# Patient Record
Sex: Male | Born: 1945
Health system: Southern US, Community
[De-identification: ages and names within clinical notes are randomized; demographics above are authoritative.]

## PROBLEM LIST (undated history)

## (undated) DIAGNOSIS — R7303 Prediabetes: Secondary | ICD-10-CM

## (undated) DIAGNOSIS — C801 Malignant (primary) neoplasm, unspecified: Secondary | ICD-10-CM

## (undated) DIAGNOSIS — I10 Essential (primary) hypertension: Secondary | ICD-10-CM

## (undated) DIAGNOSIS — I471 Supraventricular tachycardia, unspecified: Secondary | ICD-10-CM

## (undated) DIAGNOSIS — I255 Ischemic cardiomyopathy: Secondary | ICD-10-CM

## (undated) DIAGNOSIS — I509 Heart failure, unspecified: Secondary | ICD-10-CM

## (undated) DIAGNOSIS — I350 Nonrheumatic aortic (valve) stenosis: Secondary | ICD-10-CM

## (undated) DIAGNOSIS — I2510 Atherosclerotic heart disease of native coronary artery without angina pectoris: Secondary | ICD-10-CM

## (undated) HISTORY — DX: Supraventricular tachycardia, unspecified: I47.10

## (undated) HISTORY — DX: Supraventricular tachycardia: I47.1

## (undated) HISTORY — DX: Malignant (primary) neoplasm, unspecified: C80.1

## (undated) HISTORY — DX: Essential (primary) hypertension: I10

## (undated) HISTORY — DX: Heart failure, unspecified: I50.9

## (undated) HISTORY — DX: Atherosclerotic heart disease of native coronary artery without angina pectoris: I25.10

## (undated) HISTORY — DX: Ischemic cardiomyopathy: I25.5

## (undated) HISTORY — PX: SKIN CANCER EXCISION: SHX779

---

## 1997-12-12 ENCOUNTER — Inpatient Hospital Stay (HOSPITAL_COMMUNITY): Admission: RE | Admit: 1997-12-12 | Discharge: 1997-12-22 | Payer: Self-pay | Admitting: Cardiology

## 2012-10-27 DIAGNOSIS — C44319 Basal cell carcinoma of skin of other parts of face: Secondary | ICD-10-CM | POA: Diagnosis not present

## 2012-10-27 DIAGNOSIS — L408 Other psoriasis: Secondary | ICD-10-CM | POA: Diagnosis not present

## 2012-11-30 DIAGNOSIS — C4401 Basal cell carcinoma of skin of lip: Secondary | ICD-10-CM | POA: Diagnosis not present

## 2012-11-30 DIAGNOSIS — Z9889 Other specified postprocedural states: Secondary | ICD-10-CM | POA: Insufficient documentation

## 2012-11-30 DIAGNOSIS — C44319 Basal cell carcinoma of skin of other parts of face: Secondary | ICD-10-CM | POA: Diagnosis not present

## 2012-11-30 DIAGNOSIS — M952 Other acquired deformity of head: Secondary | ICD-10-CM | POA: Diagnosis not present

## 2012-12-01 DIAGNOSIS — M952 Other acquired deformity of head: Secondary | ICD-10-CM | POA: Diagnosis not present

## 2012-12-01 DIAGNOSIS — C44319 Basal cell carcinoma of skin of other parts of face: Secondary | ICD-10-CM | POA: Diagnosis not present

## 2013-03-30 DIAGNOSIS — I10 Essential (primary) hypertension: Secondary | ICD-10-CM | POA: Diagnosis not present

## 2013-03-30 DIAGNOSIS — E7800 Pure hypercholesterolemia, unspecified: Secondary | ICD-10-CM | POA: Diagnosis not present

## 2013-03-31 DIAGNOSIS — E7800 Pure hypercholesterolemia, unspecified: Secondary | ICD-10-CM | POA: Diagnosis not present

## 2013-03-31 DIAGNOSIS — E119 Type 2 diabetes mellitus without complications: Secondary | ICD-10-CM | POA: Diagnosis not present

## 2013-03-31 DIAGNOSIS — I10 Essential (primary) hypertension: Secondary | ICD-10-CM | POA: Diagnosis not present

## 2013-05-13 DIAGNOSIS — I10 Essential (primary) hypertension: Secondary | ICD-10-CM | POA: Diagnosis not present

## 2013-05-13 DIAGNOSIS — E7800 Pure hypercholesterolemia, unspecified: Secondary | ICD-10-CM | POA: Diagnosis not present

## 2013-05-20 DIAGNOSIS — E7800 Pure hypercholesterolemia, unspecified: Secondary | ICD-10-CM | POA: Diagnosis not present

## 2013-05-20 DIAGNOSIS — I10 Essential (primary) hypertension: Secondary | ICD-10-CM | POA: Diagnosis not present

## 2014-04-12 DIAGNOSIS — E7800 Pure hypercholesterolemia, unspecified: Secondary | ICD-10-CM | POA: Diagnosis not present

## 2014-04-12 DIAGNOSIS — R5381 Other malaise: Secondary | ICD-10-CM | POA: Diagnosis not present

## 2014-04-12 DIAGNOSIS — IMO0001 Reserved for inherently not codable concepts without codable children: Secondary | ICD-10-CM | POA: Diagnosis not present

## 2014-04-12 DIAGNOSIS — I10 Essential (primary) hypertension: Secondary | ICD-10-CM | POA: Diagnosis not present

## 2014-04-14 DIAGNOSIS — IMO0001 Reserved for inherently not codable concepts without codable children: Secondary | ICD-10-CM | POA: Diagnosis not present

## 2014-04-19 DIAGNOSIS — J309 Allergic rhinitis, unspecified: Secondary | ICD-10-CM | POA: Diagnosis not present

## 2014-04-19 DIAGNOSIS — M545 Low back pain, unspecified: Secondary | ICD-10-CM | POA: Diagnosis not present

## 2014-04-19 DIAGNOSIS — E7800 Pure hypercholesterolemia, unspecified: Secondary | ICD-10-CM | POA: Diagnosis not present

## 2014-04-19 DIAGNOSIS — IMO0001 Reserved for inherently not codable concepts without codable children: Secondary | ICD-10-CM | POA: Diagnosis not present

## 2014-04-19 DIAGNOSIS — I10 Essential (primary) hypertension: Secondary | ICD-10-CM | POA: Diagnosis not present

## 2014-04-19 DIAGNOSIS — L408 Other psoriasis: Secondary | ICD-10-CM | POA: Diagnosis not present

## 2014-05-31 DIAGNOSIS — E1165 Type 2 diabetes mellitus with hyperglycemia: Secondary | ICD-10-CM | POA: Diagnosis not present

## 2014-05-31 DIAGNOSIS — I10 Essential (primary) hypertension: Secondary | ICD-10-CM | POA: Diagnosis not present

## 2014-05-31 DIAGNOSIS — J019 Acute sinusitis, unspecified: Secondary | ICD-10-CM | POA: Diagnosis not present

## 2014-08-15 DIAGNOSIS — E780 Pure hypercholesterolemia: Secondary | ICD-10-CM | POA: Diagnosis not present

## 2014-08-15 DIAGNOSIS — E1165 Type 2 diabetes mellitus with hyperglycemia: Secondary | ICD-10-CM | POA: Diagnosis not present

## 2014-08-15 DIAGNOSIS — I10 Essential (primary) hypertension: Secondary | ICD-10-CM | POA: Diagnosis not present

## 2015-03-06 DIAGNOSIS — M25562 Pain in left knee: Secondary | ICD-10-CM | POA: Diagnosis not present

## 2015-03-06 DIAGNOSIS — M1712 Unilateral primary osteoarthritis, left knee: Secondary | ICD-10-CM | POA: Diagnosis not present

## 2015-08-01 DIAGNOSIS — E1165 Type 2 diabetes mellitus with hyperglycemia: Secondary | ICD-10-CM | POA: Diagnosis not present

## 2015-08-01 DIAGNOSIS — I10 Essential (primary) hypertension: Secondary | ICD-10-CM | POA: Diagnosis not present

## 2015-08-01 DIAGNOSIS — E7800 Pure hypercholesterolemia, unspecified: Secondary | ICD-10-CM | POA: Diagnosis not present

## 2015-08-07 DIAGNOSIS — I10 Essential (primary) hypertension: Secondary | ICD-10-CM | POA: Diagnosis not present

## 2015-08-07 DIAGNOSIS — Z1389 Encounter for screening for other disorder: Secondary | ICD-10-CM | POA: Diagnosis not present

## 2015-08-07 DIAGNOSIS — E1165 Type 2 diabetes mellitus with hyperglycemia: Secondary | ICD-10-CM | POA: Diagnosis not present

## 2015-10-30 DIAGNOSIS — R05 Cough: Secondary | ICD-10-CM | POA: Diagnosis not present

## 2015-12-13 DIAGNOSIS — I10 Essential (primary) hypertension: Secondary | ICD-10-CM | POA: Diagnosis not present

## 2015-12-13 DIAGNOSIS — E7800 Pure hypercholesterolemia, unspecified: Secondary | ICD-10-CM | POA: Diagnosis not present

## 2015-12-13 DIAGNOSIS — E1165 Type 2 diabetes mellitus with hyperglycemia: Secondary | ICD-10-CM | POA: Diagnosis not present

## 2016-09-19 DIAGNOSIS — I10 Essential (primary) hypertension: Secondary | ICD-10-CM | POA: Diagnosis not present

## 2016-09-19 DIAGNOSIS — E1165 Type 2 diabetes mellitus with hyperglycemia: Secondary | ICD-10-CM | POA: Diagnosis not present

## 2016-09-19 DIAGNOSIS — Z6835 Body mass index (BMI) 35.0-35.9, adult: Secondary | ICD-10-CM | POA: Diagnosis not present

## 2016-09-19 DIAGNOSIS — Z1389 Encounter for screening for other disorder: Secondary | ICD-10-CM | POA: Diagnosis not present

## 2016-09-19 DIAGNOSIS — Z23 Encounter for immunization: Secondary | ICD-10-CM | POA: Diagnosis not present

## 2016-09-19 DIAGNOSIS — Z0000 Encounter for general adult medical examination without abnormal findings: Secondary | ICD-10-CM | POA: Diagnosis not present

## 2016-09-19 DIAGNOSIS — E7800 Pure hypercholesterolemia, unspecified: Secondary | ICD-10-CM | POA: Diagnosis not present

## 2017-01-15 DIAGNOSIS — E7800 Pure hypercholesterolemia, unspecified: Secondary | ICD-10-CM | POA: Diagnosis not present

## 2017-01-15 DIAGNOSIS — E1165 Type 2 diabetes mellitus with hyperglycemia: Secondary | ICD-10-CM | POA: Diagnosis not present

## 2017-01-15 DIAGNOSIS — I10 Essential (primary) hypertension: Secondary | ICD-10-CM | POA: Diagnosis not present

## 2017-01-21 DIAGNOSIS — E1165 Type 2 diabetes mellitus with hyperglycemia: Secondary | ICD-10-CM | POA: Diagnosis not present

## 2017-01-21 DIAGNOSIS — I10 Essential (primary) hypertension: Secondary | ICD-10-CM | POA: Diagnosis not present

## 2017-01-21 DIAGNOSIS — E7800 Pure hypercholesterolemia, unspecified: Secondary | ICD-10-CM | POA: Diagnosis not present

## 2017-01-21 DIAGNOSIS — Z6834 Body mass index (BMI) 34.0-34.9, adult: Secondary | ICD-10-CM | POA: Diagnosis not present

## 2017-01-21 DIAGNOSIS — Z0001 Encounter for general adult medical examination with abnormal findings: Secondary | ICD-10-CM | POA: Diagnosis not present

## 2017-12-07 DIAGNOSIS — E1165 Type 2 diabetes mellitus with hyperglycemia: Secondary | ICD-10-CM | POA: Diagnosis not present

## 2017-12-07 DIAGNOSIS — E7800 Pure hypercholesterolemia, unspecified: Secondary | ICD-10-CM | POA: Diagnosis not present

## 2017-12-07 DIAGNOSIS — Z6834 Body mass index (BMI) 34.0-34.9, adult: Secondary | ICD-10-CM | POA: Diagnosis not present

## 2017-12-07 DIAGNOSIS — L408 Other psoriasis: Secondary | ICD-10-CM | POA: Diagnosis not present

## 2017-12-07 DIAGNOSIS — I10 Essential (primary) hypertension: Secondary | ICD-10-CM | POA: Diagnosis not present

## 2017-12-07 DIAGNOSIS — D519 Vitamin B12 deficiency anemia, unspecified: Secondary | ICD-10-CM | POA: Diagnosis not present

## 2017-12-07 DIAGNOSIS — Z0000 Encounter for general adult medical examination without abnormal findings: Secondary | ICD-10-CM | POA: Diagnosis not present

## 2018-03-22 DIAGNOSIS — B356 Tinea cruris: Secondary | ICD-10-CM | POA: Diagnosis not present

## 2018-03-22 DIAGNOSIS — R300 Dysuria: Secondary | ICD-10-CM | POA: Diagnosis not present

## 2018-03-22 DIAGNOSIS — Z6833 Body mass index (BMI) 33.0-33.9, adult: Secondary | ICD-10-CM | POA: Diagnosis not present

## 2018-03-22 DIAGNOSIS — E1165 Type 2 diabetes mellitus with hyperglycemia: Secondary | ICD-10-CM | POA: Diagnosis not present

## 2018-12-21 DIAGNOSIS — E7800 Pure hypercholesterolemia, unspecified: Secondary | ICD-10-CM | POA: Diagnosis not present

## 2018-12-21 DIAGNOSIS — E1165 Type 2 diabetes mellitus with hyperglycemia: Secondary | ICD-10-CM | POA: Diagnosis not present

## 2018-12-21 DIAGNOSIS — R5382 Chronic fatigue, unspecified: Secondary | ICD-10-CM | POA: Diagnosis not present

## 2018-12-21 DIAGNOSIS — I10 Essential (primary) hypertension: Secondary | ICD-10-CM | POA: Diagnosis not present

## 2018-12-21 DIAGNOSIS — L408 Other psoriasis: Secondary | ICD-10-CM | POA: Diagnosis not present

## 2018-12-24 DIAGNOSIS — Z0001 Encounter for general adult medical examination with abnormal findings: Secondary | ICD-10-CM | POA: Diagnosis not present

## 2018-12-24 DIAGNOSIS — I10 Essential (primary) hypertension: Secondary | ICD-10-CM | POA: Diagnosis not present

## 2018-12-24 DIAGNOSIS — Z6834 Body mass index (BMI) 34.0-34.9, adult: Secondary | ICD-10-CM | POA: Diagnosis not present

## 2018-12-24 DIAGNOSIS — E7800 Pure hypercholesterolemia, unspecified: Secondary | ICD-10-CM | POA: Diagnosis not present

## 2018-12-24 DIAGNOSIS — E1165 Type 2 diabetes mellitus with hyperglycemia: Secondary | ICD-10-CM | POA: Diagnosis not present

## 2019-11-17 DIAGNOSIS — K5900 Constipation, unspecified: Secondary | ICD-10-CM | POA: Diagnosis not present

## 2019-11-17 DIAGNOSIS — R634 Abnormal weight loss: Secondary | ICD-10-CM | POA: Diagnosis not present

## 2019-11-17 DIAGNOSIS — Z6832 Body mass index (BMI) 32.0-32.9, adult: Secondary | ICD-10-CM | POA: Diagnosis not present

## 2019-11-17 DIAGNOSIS — R0600 Dyspnea, unspecified: Secondary | ICD-10-CM | POA: Diagnosis not present

## 2019-11-17 DIAGNOSIS — R601 Generalized edema: Secondary | ICD-10-CM | POA: Diagnosis not present

## 2019-11-21 DIAGNOSIS — I498 Other specified cardiac arrhythmias: Secondary | ICD-10-CM | POA: Insufficient documentation

## 2019-11-21 DIAGNOSIS — E785 Hyperlipidemia, unspecified: Secondary | ICD-10-CM | POA: Insufficient documentation

## 2019-11-21 DIAGNOSIS — I2510 Atherosclerotic heart disease of native coronary artery without angina pectoris: Secondary | ICD-10-CM | POA: Diagnosis not present

## 2019-11-21 DIAGNOSIS — I472 Ventricular tachycardia: Secondary | ICD-10-CM | POA: Diagnosis not present

## 2019-11-21 DIAGNOSIS — I10 Essential (primary) hypertension: Secondary | ICD-10-CM | POA: Insufficient documentation

## 2019-11-21 DIAGNOSIS — I509 Heart failure, unspecified: Secondary | ICD-10-CM | POA: Diagnosis not present

## 2019-11-21 DIAGNOSIS — R55 Syncope and collapse: Secondary | ICD-10-CM | POA: Diagnosis not present

## 2019-11-21 DIAGNOSIS — I471 Supraventricular tachycardia: Secondary | ICD-10-CM | POA: Diagnosis not present

## 2019-11-28 DIAGNOSIS — J90 Pleural effusion, not elsewhere classified: Secondary | ICD-10-CM | POA: Diagnosis not present

## 2019-11-28 DIAGNOSIS — R911 Solitary pulmonary nodule: Secondary | ICD-10-CM | POA: Diagnosis not present

## 2019-11-28 DIAGNOSIS — I700 Atherosclerosis of aorta: Secondary | ICD-10-CM | POA: Diagnosis not present

## 2019-11-28 DIAGNOSIS — I2510 Atherosclerotic heart disease of native coronary artery without angina pectoris: Secondary | ICD-10-CM | POA: Diagnosis not present

## 2019-11-28 DIAGNOSIS — R188 Other ascites: Secondary | ICD-10-CM | POA: Diagnosis not present

## 2019-12-06 DIAGNOSIS — I509 Heart failure, unspecified: Secondary | ICD-10-CM | POA: Diagnosis not present

## 2019-12-06 DIAGNOSIS — I350 Nonrheumatic aortic (valve) stenosis: Secondary | ICD-10-CM | POA: Diagnosis not present

## 2019-12-06 DIAGNOSIS — I340 Nonrheumatic mitral (valve) insufficiency: Secondary | ICD-10-CM | POA: Diagnosis not present

## 2019-12-06 DIAGNOSIS — I351 Nonrheumatic aortic (valve) insufficiency: Secondary | ICD-10-CM | POA: Diagnosis not present

## 2019-12-06 DIAGNOSIS — I110 Hypertensive heart disease with heart failure: Secondary | ICD-10-CM | POA: Diagnosis not present

## 2019-12-12 DIAGNOSIS — I5022 Chronic systolic (congestive) heart failure: Secondary | ICD-10-CM | POA: Diagnosis not present

## 2019-12-12 DIAGNOSIS — R55 Syncope and collapse: Secondary | ICD-10-CM | POA: Diagnosis not present

## 2019-12-12 DIAGNOSIS — I498 Other specified cardiac arrhythmias: Secondary | ICD-10-CM | POA: Diagnosis not present

## 2019-12-12 DIAGNOSIS — I255 Ischemic cardiomyopathy: Secondary | ICD-10-CM | POA: Diagnosis not present

## 2019-12-12 DIAGNOSIS — I10 Essential (primary) hypertension: Secondary | ICD-10-CM | POA: Diagnosis not present

## 2019-12-12 DIAGNOSIS — E785 Hyperlipidemia, unspecified: Secondary | ICD-10-CM | POA: Diagnosis not present

## 2019-12-14 DIAGNOSIS — I472 Ventricular tachycardia: Secondary | ICD-10-CM | POA: Diagnosis not present

## 2019-12-14 DIAGNOSIS — R55 Syncope and collapse: Secondary | ICD-10-CM | POA: Diagnosis not present

## 2019-12-14 DIAGNOSIS — I471 Supraventricular tachycardia: Secondary | ICD-10-CM | POA: Diagnosis not present

## 2019-12-27 DIAGNOSIS — S8992XA Unspecified injury of left lower leg, initial encounter: Secondary | ICD-10-CM | POA: Diagnosis not present

## 2019-12-27 DIAGNOSIS — E785 Hyperlipidemia, unspecified: Secondary | ICD-10-CM | POA: Diagnosis not present

## 2019-12-27 DIAGNOSIS — I499 Cardiac arrhythmia, unspecified: Secondary | ICD-10-CM | POA: Diagnosis not present

## 2019-12-27 DIAGNOSIS — Z7984 Long term (current) use of oral hypoglycemic drugs: Secondary | ICD-10-CM | POA: Diagnosis not present

## 2019-12-27 DIAGNOSIS — E872 Acidosis: Secondary | ICD-10-CM | POA: Diagnosis not present

## 2019-12-27 DIAGNOSIS — I2510 Atherosclerotic heart disease of native coronary artery without angina pectoris: Secondary | ICD-10-CM | POA: Diagnosis not present

## 2019-12-27 DIAGNOSIS — Z87891 Personal history of nicotine dependence: Secondary | ICD-10-CM | POA: Diagnosis not present

## 2019-12-27 DIAGNOSIS — Z951 Presence of aortocoronary bypass graft: Secondary | ICD-10-CM | POA: Diagnosis not present

## 2019-12-27 DIAGNOSIS — Z7982 Long term (current) use of aspirin: Secondary | ICD-10-CM | POA: Diagnosis not present

## 2019-12-27 DIAGNOSIS — E118 Type 2 diabetes mellitus with unspecified complications: Secondary | ICD-10-CM | POA: Diagnosis not present

## 2019-12-27 DIAGNOSIS — R7402 Elevation of levels of lactic acid dehydrogenase (LDH): Secondary | ICD-10-CM | POA: Diagnosis not present

## 2019-12-27 DIAGNOSIS — L03115 Cellulitis of right lower limb: Secondary | ICD-10-CM | POA: Diagnosis not present

## 2019-12-27 DIAGNOSIS — L03116 Cellulitis of left lower limb: Secondary | ICD-10-CM | POA: Diagnosis not present

## 2019-12-27 DIAGNOSIS — Z79899 Other long term (current) drug therapy: Secondary | ICD-10-CM | POA: Diagnosis not present

## 2019-12-27 DIAGNOSIS — I509 Heart failure, unspecified: Secondary | ICD-10-CM | POA: Diagnosis not present

## 2019-12-27 DIAGNOSIS — S81811A Laceration without foreign body, right lower leg, initial encounter: Secondary | ICD-10-CM | POA: Diagnosis not present

## 2019-12-27 DIAGNOSIS — I110 Hypertensive heart disease with heart failure: Secondary | ICD-10-CM | POA: Diagnosis not present

## 2019-12-27 DIAGNOSIS — E869 Volume depletion, unspecified: Secondary | ICD-10-CM | POA: Diagnosis not present

## 2019-12-27 DIAGNOSIS — D72829 Elevated white blood cell count, unspecified: Secondary | ICD-10-CM | POA: Diagnosis not present

## 2019-12-27 DIAGNOSIS — Z20822 Contact with and (suspected) exposure to covid-19: Secondary | ICD-10-CM | POA: Diagnosis not present

## 2019-12-28 DIAGNOSIS — L03115 Cellulitis of right lower limb: Secondary | ICD-10-CM | POA: Diagnosis not present

## 2019-12-28 DIAGNOSIS — I2510 Atherosclerotic heart disease of native coronary artery without angina pectoris: Secondary | ICD-10-CM | POA: Diagnosis not present

## 2019-12-28 DIAGNOSIS — E119 Type 2 diabetes mellitus without complications: Secondary | ICD-10-CM | POA: Diagnosis not present

## 2019-12-28 DIAGNOSIS — I5022 Chronic systolic (congestive) heart failure: Secondary | ICD-10-CM | POA: Diagnosis not present

## 2019-12-30 ENCOUNTER — Other Ambulatory Visit (HOSPITAL_COMMUNITY): Payer: PPO

## 2020-01-03 ENCOUNTER — Ambulatory Visit (HOSPITAL_COMMUNITY): Admission: RE | Admit: 2020-01-03 | Payer: PPO | Source: Home / Self Care | Admitting: Cardiology

## 2020-01-03 ENCOUNTER — Encounter (HOSPITAL_COMMUNITY): Admission: RE | Payer: Self-pay | Source: Home / Self Care

## 2020-01-03 SURGERY — RIGHT/LEFT HEART CATH AND CORONARY ANGIOGRAPHY
Anesthesia: LOCAL

## 2020-01-12 DIAGNOSIS — E7800 Pure hypercholesterolemia, unspecified: Secondary | ICD-10-CM | POA: Diagnosis not present

## 2020-01-12 DIAGNOSIS — D519 Vitamin B12 deficiency anemia, unspecified: Secondary | ICD-10-CM | POA: Diagnosis not present

## 2020-01-12 DIAGNOSIS — R5382 Chronic fatigue, unspecified: Secondary | ICD-10-CM | POA: Diagnosis not present

## 2020-01-12 DIAGNOSIS — I10 Essential (primary) hypertension: Secondary | ICD-10-CM | POA: Diagnosis not present

## 2020-01-12 DIAGNOSIS — E1165 Type 2 diabetes mellitus with hyperglycemia: Secondary | ICD-10-CM | POA: Diagnosis not present

## 2020-01-16 DIAGNOSIS — Z6830 Body mass index (BMI) 30.0-30.9, adult: Secondary | ICD-10-CM | POA: Diagnosis not present

## 2020-01-16 DIAGNOSIS — R634 Abnormal weight loss: Secondary | ICD-10-CM | POA: Diagnosis not present

## 2020-01-16 DIAGNOSIS — R601 Generalized edema: Secondary | ICD-10-CM | POA: Diagnosis not present

## 2020-01-16 DIAGNOSIS — K5900 Constipation, unspecified: Secondary | ICD-10-CM | POA: Diagnosis not present

## 2020-01-16 DIAGNOSIS — Z0001 Encounter for general adult medical examination with abnormal findings: Secondary | ICD-10-CM | POA: Diagnosis not present

## 2020-01-18 ENCOUNTER — Other Ambulatory Visit: Payer: Self-pay

## 2020-01-18 ENCOUNTER — Encounter: Payer: Self-pay | Admitting: Cardiology

## 2020-01-18 ENCOUNTER — Encounter: Payer: Self-pay | Admitting: *Deleted

## 2020-01-18 ENCOUNTER — Ambulatory Visit (INDEPENDENT_AMBULATORY_CARE_PROVIDER_SITE_OTHER): Payer: PPO | Admitting: Cardiology

## 2020-01-18 VITALS — BP 114/78 | HR 107 | Ht 69.5 in | Wt 208.6 lb

## 2020-01-18 DIAGNOSIS — I471 Supraventricular tachycardia, unspecified: Secondary | ICD-10-CM

## 2020-01-18 DIAGNOSIS — I5022 Chronic systolic (congestive) heart failure: Secondary | ICD-10-CM

## 2020-01-18 DIAGNOSIS — I2510 Atherosclerotic heart disease of native coronary artery without angina pectoris: Secondary | ICD-10-CM

## 2020-01-18 DIAGNOSIS — Z01812 Encounter for preprocedural laboratory examination: Secondary | ICD-10-CM | POA: Diagnosis not present

## 2020-01-18 MED ORDER — SACUBITRIL-VALSARTAN 49-51 MG PO TABS: 1.0000 | ORAL_TABLET | Freq: Two times a day (BID) | ORAL | 6 refills | Status: DC

## 2020-01-18 MED ORDER — SACUBITRIL-VALSARTAN 49-51 MG PO TABS: 1.0000 | ORAL_TABLET | Freq: Two times a day (BID) | ORAL | 0 refills | Status: DC

## 2020-01-18 NOTE — H&P (View-Only) (Signed)
Clinical Summary [name] is a 74 y.o.male seen as a new patient. Previously followed at [facility] but is establishing here in our clinic   1. Chronic systolic HF - from [facility] notes 12/2019 echo showed LVEF 20-25%, new diagnosis for patient - at that time had significant volume overload, succesfully diuresed and started on medical therapy.  - he is compliant with meds - no recent SOB/DOE   2. PSVT - from [facility] can see a zio patch was ordered, there a phone note reporting it looked good but I do not see the full report - no recent palpitatoins - he is on beta blocker    3. CAD - history of 5 vessel CABG in 1999      PMH 1. CAD 2. PSVT   No Known Allergies   Current Outpatient Medications  Medication Sig Dispense Refill  . acetaminophen (TYLENOL) 650 MG CR tablet Take 1,300 mg by mouth every 8 (eight) hours as needed for pain.    . Ascorbic Acid (VITAMIN C) 1000 MG tablet Take 1,000 mg by mouth daily.    Marland Kitchen aspirin EC 325 MG tablet Take 325 mg by mouth daily.    . Ca Carbonate-Mag Hydroxide (ROLAIDS PO) Take 1 tablet by mouth daily as needed (indigestion).    . Cholecalciferol (DIALYVITE VITAMIN D 5000) 125 MCG (5000 UT) capsule Take 5,000 Units by mouth daily.    . Coenzyme Q10 (COQ10) 100 MG CAPS Take 100 mg by mouth daily.    . furosemide (LASIX) 40 MG tablet Take 40 mg by mouth daily.    Marland Kitchen glipiZIDE (GLUCOTROL XL) 10 MG 24 hr tablet Take 10 mg by mouth daily.    . Melatonin 5 MG CAPS Take 5 mg by mouth at bedtime as needed (sleep).    . metFORMIN (GLUCOPHAGE-XR) 500 MG 24 hr tablet Take 1,000 mg by mouth 2 (two) times daily.    . metoprolol succinate (TOPROL-XL) 100 MG 24 hr tablet Take 100 mg by mouth daily. Take with or immediately following a meal.    . quinapril (ACCUPRIL) 40 MG tablet Take 40 mg by mouth daily.    . rosuvastatin (CRESTOR) 10 MG tablet Take 10 mg by mouth at bedtime.     No current facility-administered medications for this visit.         No Known Allergies    No family history on file.   Social History [name] has no history on file for tobacco use. [name] has no history on file for alcohol use.   Review of Systems CONSTITUTIONAL: No weight loss, fever, chills, weakness or fatigue.  HEENT: Eyes: No visual loss, blurred vision, double vision or yellow sclerae.No hearing loss, sneezing, congestion, runny nose or sore throat.  SKIN: No rash or itching.  CARDIOVASCULAR: per hpi RESPIRATORY: No shortness of breath, cough or sputum.  GASTROINTESTINAL: No anorexia, nausea, vomiting or diarrhea. No abdominal pain or blood.  GENITOURINARY: No burning on urination, no polyuria NEUROLOGICAL: No headache, dizziness, syncope, paralysis, ataxia, numbness or tingling in the extremities. No change in bowel or bladder control.  MUSCULOSKELETAL: No muscle, back pain, joint pain or stiffness.  LYMPHATICS: No enlarged nodes. No history of splenectomy.  PSYCHIATRIC: No history of depression or anxiety.  ENDOCRINOLOGIC: No reports of sweating, cold or heat intolerance. No polyuria or polydipsia.  Marland Kitchen   Physical Examination Today's Vitals   01/18/20 0823  BP: 114/78  Pulse: (!) 107  SpO2: 98%  Weight: 208  lb 9.6 oz (94.6 kg)  Height: 5' 9.5" (1.765 m)   Body mass index is 30.36 kg/m.  Gen: resting comfortably, no acute distress HEENT: no scleral icterus, pupils equal round and reactive, no palptable cervical adenopathy,  CV: RRR, no m/r/g, no jvd Resp: Clear to auscultation bilaterally GI: abdomen is soft, non-tender, non-distended, normal bowel sounds, no hepatosplenomegaly MSK: extremities are warm, no edema.  Skin: warm, no rash Neuro:  no focal deficits Psych: appropriate affect   Diagnostic Studies  Echo: 12/2019 Summary 1. Technically difficult study due to body habitus. 2. Echo contrast utilized to enhance endocardial border definition. 3. The left ventricle is mildly to moderately dilated  in size with upper normal wall thickness. 4. The left ventricular systolic function is severely decreased, LVEF is visually estimated at 20-25%. 5. There is moderate mitral valve regurgitation. 6. There is mild to moderate aortic valve stenosis. 7. There is mild aortic regurgitation. 8. The left atrium is moderately dilated in size. 9. The right ventricle is normal in size, with normal systolic function. 10. The right atrium is mildly dilated in size.    Assessment and Plan  1. Chronic systolic HF - new diagnosis based on 12/2019 echo at [facility], LVEF 20-25% - prior history of CAD with prior CABG, certaintly ICM is a likely cause - will arrange RHC/LHC, he would like to have done in 3 weeks to complete his f/u for recent issues with cellulitis in his leg, he is off abx - d/c quinapril, wait 48 hours and start entresto 49/51mg  bid.   2. CAD - no recent chest pain - with drop in LVEF plan for cath - he is on full dose ASA, this is our first visit together, review prior cardiology records to see indications, likely change to 81mg  at f/u  3. PSVT - request zio patch results from [facility] - no symptoms, continue beta blocker     I have reviewed the risks, indications, and alternatives to cardiac catheterization, possible angioplasty, and stenting with the patient and his wife today. Risks include but are not limited to bleeding, infection, vascular injury, stroke, myocardial infection, arrhythmia, kidney injury, radiation-related injury in the case of prolonged fluoroscopy use, emergency cardiac surgery, and death. The patient understands the risks of serious complication is 1-2 in 2706 with diagnostic cardiac cath and 1-2% or less with angioplasty/stenting.     Arnoldo Lenis, M.D.

## 2020-01-18 NOTE — Progress Notes (Signed)
Clinical Summary Mr. Mohrmann is a 74 y.o.male seen as a new patient. Previously followed at [facility] but is establishing here in our clinic   1. Chronic systolic HF - from [facility] notes 12/2019 echo showed LVEF 20-25%, new diagnosis for patient - at that time had significant volume overload, succesfully diuresed and started on medical therapy.  - he is compliant with meds - no recent SOB/DOE   2. PSVT - from [facility] can see a zio patch was ordered, there a phone note reporting it looked good but I do not see the full report - no recent palpitatoins - he is on beta blocker    3. CAD - history of 5 vessel CABG in 1999      PMH 1. CAD 2. PSVT   No Known Allergies   Current Outpatient Medications  Medication Sig Dispense Refill   acetaminophen (TYLENOL) 650 MG CR tablet Take 1,300 mg by mouth every 8 (eight) hours as needed for pain.     Ascorbic Acid (VITAMIN C) 1000 MG tablet Take 1,000 mg by mouth daily.     aspirin EC 325 MG tablet Take 325 mg by mouth daily.     Ca Carbonate-Mag Hydroxide (ROLAIDS PO) Take 1 tablet by mouth daily as needed (indigestion).     Cholecalciferol (DIALYVITE VITAMIN D 5000) 125 MCG (5000 UT) capsule Take 5,000 Units by mouth daily.     Coenzyme Q10 (COQ10) 100 MG CAPS Take 100 mg by mouth daily.     furosemide (LASIX) 40 MG tablet Take 40 mg by mouth daily.     glipiZIDE (GLUCOTROL XL) 10 MG 24 hr tablet Take 10 mg by mouth daily.     Melatonin 5 MG CAPS Take 5 mg by mouth at bedtime as needed (sleep).     metFORMIN (GLUCOPHAGE-XR) 500 MG 24 hr tablet Take 1,000 mg by mouth 2 (two) times daily.     metoprolol succinate (TOPROL-XL) 100 MG 24 hr tablet Take 100 mg by mouth daily. Take with or immediately following a meal.     quinapril (ACCUPRIL) 40 MG tablet Take 40 mg by mouth daily.     rosuvastatin (CRESTOR) 10 MG tablet Take 10 mg by mouth at bedtime.     No current facility-administered medications for this visit.         No Known Allergies    No family history on file.   Social History Mr. Swindell has no history on file for tobacco use. Mr. Collingsworth has no history on file for alcohol use.   Review of Systems CONSTITUTIONAL: No weight loss, fever, chills, weakness or fatigue.  HEENT: Eyes: No visual loss, blurred vision, double vision or yellow sclerae.No hearing loss, sneezing, congestion, runny nose or sore throat.  SKIN: No rash or itching.  CARDIOVASCULAR: per hpi RESPIRATORY: No shortness of breath, cough or sputum.  GASTROINTESTINAL: No anorexia, nausea, vomiting or diarrhea. No abdominal pain or blood.  GENITOURINARY: No burning on urination, no polyuria NEUROLOGICAL: No headache, dizziness, syncope, paralysis, ataxia, numbness or tingling in the extremities. No change in bowel or bladder control.  MUSCULOSKELETAL: No muscle, back pain, joint pain or stiffness.  LYMPHATICS: No enlarged nodes. No history of splenectomy.  PSYCHIATRIC: No history of depression or anxiety.  ENDOCRINOLOGIC: No reports of sweating, cold or heat intolerance. No polyuria or polydipsia.  Marland Kitchen   Physical Examination Today's Vitals   01/18/20 0823  BP: 114/78  Pulse: (!) 107  SpO2: 98%  Weight: 208  lb 9.6 oz (94.6 kg)  Height: 5' 9.5" (1.765 m)   Body mass index is 30.36 kg/m.  Gen: resting comfortably, no acute distress HEENT: no scleral icterus, pupils equal round and reactive, no palptable cervical adenopathy,  CV: RRR, no m/r/g, no jvd Resp: Clear to auscultation bilaterally GI: abdomen is soft, non-tender, non-distended, normal bowel sounds, no hepatosplenomegaly MSK: extremities are warm, no edema.  Skin: warm, no rash Neuro:  no focal deficits Psych: appropriate affect   Diagnostic Studies  Echo: 12/2019 Summary 1. Technically difficult study due to body habitus. 2. Echo contrast utilized to enhance endocardial border definition. 3. The left ventricle is mildly to moderately dilated  in size with upper normal wall thickness. 4. The left ventricular systolic function is severely decreased, LVEF is visually estimated at 20-25%. 5. There is moderate mitral valve regurgitation. 6. There is mild to moderate aortic valve stenosis. 7. There is mild aortic regurgitation. 8. The left atrium is moderately dilated in size. 9. The right ventricle is normal in size, with normal systolic function. 10. The right atrium is mildly dilated in size.    Assessment and Plan  1. Chronic systolic HF - new diagnosis based on 12/2019 echo at [facility], LVEF 20-25% - prior history of CAD with prior CABG, certaintly ICM is a likely cause - will arrange RHC/LHC, he would like to have done in 3 weeks to complete his f/u for recent issues with cellulitis in his leg, he is off abx - d/c quinapril, wait 48 hours and start entresto 49/51mg  bid.   2. CAD - no recent chest pain - with drop in LVEF plan for cath - he is on full dose ASA, this is our first visit together, review prior cardiology records to see indications, likely change to 81mg  at f/u  3. PSVT - request zio patch results from [facility] - no symptoms, continue beta blocker     I have reviewed the risks, indications, and alternatives to cardiac catheterization, possible angioplasty, and stenting with the patient and his wife today. Risks include but are not limited to bleeding, infection, vascular injury, stroke, myocardial infection, arrhythmia, kidney injury, radiation-related injury in the case of prolonged fluoroscopy use, emergency cardiac surgery, and death. The patient understands the risks of serious complication is 1-2 in 9833 with diagnostic cardiac cath and 1-2% or less with angioplasty/stenting.     Arnoldo Lenis, M.D.

## 2020-01-18 NOTE — Patient Instructions (Signed)
Medication Instructions:   Stop Quinapril.    After 48 hours, begin Entresto 49/51mg  twice a day.  Continue all other medications.    Labwork: BMET, CBC - orders given today.  Testing/Procedures: Your physician has requested that you have a cardiac catheterization. Cardiac catheterization is used to diagnose and/or treat various heart conditions. Doctors may recommend this procedure for a number of different reasons. The most common reason is to evaluate chest pain. Chest pain can be a symptom of coronary artery disease (CAD), and cardiac catheterization can show whether plaque is narrowing or blocking your heart's arteries. This procedure is also used to evaluate the valves, as well as measure the blood flow and oxygen levels in different parts of your heart. For further information please visit [email or web address]. Please follow instruction sheet, as given.  Follow-Up: 1 month   Any Other Special Instructions Will Be Listed Below (If Applicable).  If you need a refill on your cardiac medications before your next appointment, please call your pharmacy.

## 2020-01-26 ENCOUNTER — Telehealth: Payer: Self-pay | Admitting: Cardiology

## 2020-01-26 ENCOUNTER — Telehealth: Payer: Self-pay

## 2020-01-26 NOTE — Telephone Encounter (Signed)
New message   Jinny Blossom [phone]   Ref # [id number]   [place] authorization for [employer]

## 2020-01-28 DIAGNOSIS — L03119 Cellulitis of unspecified part of limb: Secondary | ICD-10-CM | POA: Diagnosis not present

## 2020-02-01 ENCOUNTER — Telehealth: Payer: Self-pay

## 2020-02-01 DIAGNOSIS — Z01812 Encounter for preprocedural laboratory examination: Secondary | ICD-10-CM | POA: Diagnosis not present

## 2020-02-01 DIAGNOSIS — I5022 Chronic systolic (congestive) heart failure: Secondary | ICD-10-CM | POA: Diagnosis not present

## 2020-02-01 LAB — BASIC METABOLIC PANEL
BUN/Creatinine Ratio: 29 (calc) — ABNORMAL HIGH (ref 6–22)
BUN: 30 mg/dL — ABNORMAL HIGH (ref 7–25)
CO2: 27 mmol/L (ref 20–32)
Calcium: 10.2 mg/dL (ref 8.6–10.3)
Chloride: 103 mmol/L (ref 98–110)
Creat: 1.02 mg/dL (ref 0.70–1.18)
Glucose, Bld: 198 mg/dL — ABNORMAL HIGH (ref 65–139)
Potassium: 5.3 mmol/L (ref 3.5–5.3)
Sodium: 138 mmol/L (ref 135–146)

## 2020-02-01 LAB — CBC
HCT: 42.6 % (ref 38.5–50.0)
Hemoglobin: 13.8 g/dL (ref 13.2–17.1)
MCH: 28.5 pg (ref 27.0–33.0)
MCHC: 32.4 g/dL (ref 32.0–36.0)
MCV: 88 fL (ref 80.0–100.0)
MPV: 11.6 fL (ref 7.5–12.5)
Platelets: 229 10*3/uL (ref 140–400)
RBC: 4.84 10*6/uL (ref 4.20–5.80)
RDW: 16.3 % — ABNORMAL HIGH (ref 11.0–15.0)
WBC: 9.7 10*3/uL (ref 3.8–10.8)

## 2020-02-01 NOTE — Telephone Encounter (Signed)
Spoke with the pt and he verbalized understanding of his Cath intruction. Will call if he has any further questions.   Pt aware.. Md to review his labs that he had drawn today 02/02/20.   Pt had his COVID vaccine 09/09/19 and 10/08/19.

## 2020-02-01 NOTE — Telephone Encounter (Signed)
Per his cath instructions letter - he was to be doing them today.

## 2020-02-01 NOTE — Telephone Encounter (Signed)
LMTCB RE:   Pre-catheterization call for Cath scheduled at [facility] for: 02/08/20.   Verified arrival time and place: [facility]) at: 6:30 am.    Pt still needs to have his labs drawn... orders are in the Epic   No solid food after midnight prior to cath, clear liquids until 5 AM day of procedure.   AM meds can be  taken pre-cath with sips of water including: ASA 81 mg ( he has been on 325 mg)   Hold:  lasix, Glucotrol, and Metformin day of procedure (48 hours after also for metformin)    Confirmed patient has responsible adult to drive home post procedure and observe 24 hours after arriving home:   You are allowed ONE visitor in the waiting room during your procedure. Both you and your visitor must wear masks.        COVID-19 Pre-Screening Questions:  . In the past 7 to 10 days have you had a new cough, shortness of breath, headache, congestion, fever (100 or greater) unexplained body aches, new sore throat, or sudden loss of taste or sense of smell? Marland Kitchen In the past 7 to 10 days have you been around anyone with known Covid 19?

## 2020-02-08 ENCOUNTER — Ambulatory Visit (HOSPITAL_COMMUNITY)
Admission: RE | Admit: 2020-02-08 | Discharge: 2020-02-08 | Disposition: A | Discharge status: Home / Self Care | Payer: PPO | Attending: Cardiovascular Disease | Admitting: Cardiovascular Disease

## 2020-02-08 ENCOUNTER — Encounter (HOSPITAL_COMMUNITY)
Admission: RE | Disposition: A | Discharge status: Home / Self Care | Payer: Self-pay | Source: Home / Self Care | Attending: Cardiovascular Disease

## 2020-02-08 ENCOUNTER — Other Ambulatory Visit: Payer: Self-pay

## 2020-02-08 DIAGNOSIS — Z951 Presence of aortocoronary bypass graft: Secondary | ICD-10-CM | POA: Diagnosis not present

## 2020-02-08 DIAGNOSIS — I471 Supraventricular tachycardia: Secondary | ICD-10-CM | POA: Diagnosis not present

## 2020-02-08 DIAGNOSIS — Z7984 Long term (current) use of oral hypoglycemic drugs: Secondary | ICD-10-CM | POA: Diagnosis not present

## 2020-02-08 DIAGNOSIS — I255 Ischemic cardiomyopathy: Secondary | ICD-10-CM | POA: Diagnosis not present

## 2020-02-08 DIAGNOSIS — Z79899 Other long term (current) drug therapy: Secondary | ICD-10-CM | POA: Diagnosis not present

## 2020-02-08 DIAGNOSIS — I5022 Chronic systolic (congestive) heart failure: Secondary | ICD-10-CM | POA: Diagnosis not present

## 2020-02-08 DIAGNOSIS — Z7982 Long term (current) use of aspirin: Secondary | ICD-10-CM | POA: Diagnosis not present

## 2020-02-08 DIAGNOSIS — I2510 Atherosclerotic heart disease of native coronary artery without angina pectoris: Secondary | ICD-10-CM

## 2020-02-08 DIAGNOSIS — I5023 Acute on chronic systolic (congestive) heart failure: Secondary | ICD-10-CM

## 2020-02-08 HISTORY — PX: RIGHT/LEFT HEART CATH AND CORONARY/GRAFT ANGIOGRAPHY: CATH118267

## 2020-02-08 LAB — POCT I-STAT 7, (LYTES, BLD GAS, ICA,H+H)
Acid-base deficit: 2 mmol/L (ref 0.0–2.0)
Bicarbonate: 23.1 mmol/L (ref 20.0–28.0)
Calcium, Ion: 1.3 mmol/L (ref 1.15–1.40)
HCT: 41 % (ref 39.0–52.0)
Hemoglobin: 13.9 g/dL (ref 13.0–17.0)
O2 Saturation: 98 %
Potassium: 4.6 mmol/L (ref 3.5–5.1)
Sodium: 140 mmol/L (ref 135–145)
TCO2: 24 mmol/L (ref 22–32)
pCO2 arterial: 39.4 mmHg (ref 32.0–48.0)
pH, Arterial: 7.375 (ref 7.350–7.450)
pO2, Arterial: 113 mmHg — ABNORMAL HIGH (ref 83.0–108.0)

## 2020-02-08 LAB — POCT I-STAT EG7
Acid-base deficit: 1 mmol/L (ref 0.0–2.0)
Bicarbonate: 24.5 mmol/L (ref 20.0–28.0)
Calcium, Ion: 1.29 mmol/L (ref 1.15–1.40)
HCT: 41 % (ref 39.0–52.0)
Hemoglobin: 13.9 g/dL (ref 13.0–17.0)
O2 Saturation: 70 %
Potassium: 4.5 mmol/L (ref 3.5–5.1)
Sodium: 140 mmol/L (ref 135–145)
TCO2: 26 mmol/L (ref 22–32)
pCO2, Ven: 43.5 mmHg — ABNORMAL LOW (ref 44.0–60.0)
pH, Ven: 7.358 (ref 7.250–7.430)
pO2, Ven: 39 mmHg (ref 32.0–45.0)

## 2020-02-08 LAB — GLUCOSE, CAPILLARY
Glucose-Capillary: 115 mg/dL — ABNORMAL HIGH (ref 70–99)
Glucose-Capillary: 128 mg/dL — ABNORMAL HIGH (ref 70–99)

## 2020-02-08 SURGERY — RIGHT/LEFT HEART CATH AND CORONARY/GRAFT ANGIOGRAPHY
Anesthesia: LOCAL

## 2020-02-08 MED ORDER — HYDRALAZINE HCL 20 MG/ML IJ SOLN: 10.0000 mg | INTRAMUSCULAR | Status: DC | PRN

## 2020-02-08 MED ORDER — LIDOCAINE HCL (PF) 1 % IJ SOLN
INTRAMUSCULAR | Status: AC
  Filled 2020-02-08: qty 30

## 2020-02-08 MED ORDER — SODIUM CHLORIDE 0.9 % IV SOLN: 250.0000 mL | INTRAVENOUS | Status: DC | PRN

## 2020-02-08 MED ORDER — SODIUM CHLORIDE 0.9% FLUSH: 3.0000 mL | INTRAVENOUS | Status: DC | PRN

## 2020-02-08 MED ORDER — MIDAZOLAM HCL 2 MG/2ML IJ SOLN
INTRAMUSCULAR | Status: DC | PRN
  Administered 2020-02-08: 2 mg via INTRAVENOUS

## 2020-02-08 MED ORDER — ACETAMINOPHEN 325 MG PO TABS: 650.0000 mg | ORAL_TABLET | ORAL | Status: DC | PRN

## 2020-02-08 MED ORDER — HEPARIN (PORCINE) IN NACL 1000-0.9 UT/500ML-% IV SOLN
INTRAVENOUS | Status: AC
  Filled 2020-02-08: qty 500

## 2020-02-08 MED ORDER — IOHEXOL 350 MG/ML SOLN
INTRAVENOUS | Status: AC
  Filled 2020-02-08: qty 1

## 2020-02-08 MED ORDER — VERAPAMIL HCL 2.5 MG/ML IV SOLN
INTRAVENOUS | Status: AC
  Filled 2020-02-08: qty 2

## 2020-02-08 MED ORDER — ONDANSETRON HCL 4 MG/2ML IJ SOLN: 4.0000 mg | Freq: Four times a day (QID) | INTRAMUSCULAR | Status: DC | PRN

## 2020-02-08 MED ORDER — SODIUM CHLORIDE 0.9 % IV SOLN: INTRAVENOUS | Status: AC

## 2020-02-08 MED ORDER — SODIUM CHLORIDE 0.9% FLUSH: 3.0000 mL | Freq: Two times a day (BID) | INTRAVENOUS | Status: DC

## 2020-02-08 MED ORDER — ASPIRIN 81 MG PO CHEW
81.0000 mg | CHEWABLE_TABLET | ORAL | Status: AC
  Administered 2020-02-08: 81 mg via ORAL
  Filled 2020-02-08: qty 1

## 2020-02-08 MED ORDER — IOHEXOL 350 MG/ML SOLN
INTRAVENOUS | Status: DC | PRN
  Administered 2020-02-08: 70 mL

## 2020-02-08 MED ORDER — LABETALOL HCL 5 MG/ML IV SOLN: 10.0000 mg | INTRAVENOUS | Status: DC | PRN

## 2020-02-08 MED ORDER — MIDAZOLAM HCL 2 MG/2ML IJ SOLN
INTRAMUSCULAR | Status: AC
  Filled 2020-02-08: qty 2

## 2020-02-08 MED ORDER — HEPARIN (PORCINE) IN NACL 1000-0.9 UT/500ML-% IV SOLN
INTRAVENOUS | Status: DC | PRN
  Administered 2020-02-08 (×2): 500 mL

## 2020-02-08 MED ORDER — LIDOCAINE HCL (PF) 1 % IJ SOLN
INTRAMUSCULAR | Status: DC | PRN
  Administered 2020-02-08: 2 mL via SUBCUTANEOUS

## 2020-02-08 MED ORDER — HEPARIN SODIUM (PORCINE) 1000 UNIT/ML IJ SOLN
INTRAMUSCULAR | Status: DC | PRN
  Administered 2020-02-08: 4500 [IU] via INTRAVENOUS

## 2020-02-08 MED ORDER — HEPARIN SODIUM (PORCINE) 1000 UNIT/ML IJ SOLN
INTRAMUSCULAR | Status: AC
  Filled 2020-02-08: qty 1

## 2020-02-08 MED ORDER — FENTANYL CITRATE (PF) 100 MCG/2ML IJ SOLN
INTRAMUSCULAR | Status: DC | PRN
  Administered 2020-02-08: 50 ug via INTRAVENOUS

## 2020-02-08 MED ORDER — SODIUM CHLORIDE 0.9 % WEIGHT BASED INFUSION
3.0000 mL/kg/h | INTRAVENOUS | Status: AC
  Administered 2020-02-08: 3 mL/kg/h via INTRAVENOUS

## 2020-02-08 MED ORDER — FENTANYL CITRATE (PF) 100 MCG/2ML IJ SOLN
INTRAMUSCULAR | Status: AC
  Filled 2020-02-08: qty 2

## 2020-02-08 MED ORDER — VERAPAMIL HCL 2.5 MG/ML IV SOLN
INTRAVENOUS | Status: DC | PRN
  Administered 2020-02-08: 10 mL via INTRA_ARTERIAL

## 2020-02-08 MED ORDER — SODIUM CHLORIDE 0.9 % WEIGHT BASED INFUSION: 1.0000 mL/kg/h | INTRAVENOUS | Status: DC

## 2020-02-08 SURGICAL SUPPLY — 13 items
CATH BALLN WEDGE 5F 110CM (CATHETERS) ×1 IMPLANT
CATH EXPO 5F MPA-1 (CATHETERS) ×1 IMPLANT
CATH INFINITI 5 FR RCB (CATHETERS) ×1 IMPLANT
CATH INFINITI 5FR MULTPACK ANG (CATHETERS) ×1 IMPLANT
DEVICE RAD COMP TR BAND LRG (VASCULAR PRODUCTS) ×1 IMPLANT
GLIDESHEATH SLEND SS 6F .021 (SHEATH) ×1 IMPLANT
GUIDEWIRE INQWIRE 1.5J.035X260 (WIRE) IMPLANT
INQWIRE 1.5J .035X260CM (WIRE) ×2
KIT HEART LEFT (KITS) ×2 IMPLANT
PACK CARDIAC CATHETERIZATION (CUSTOM PROCEDURE TRAY) ×2 IMPLANT
SHEATH GLIDE SLENDER 4/5FR (SHEATH) ×1 IMPLANT
TRANSDUCER W/STOPCOCK (MISCELLANEOUS) ×2 IMPLANT
TUBING CIL FLEX 10 FLL-RA (TUBING) ×2 IMPLANT

## 2020-02-08 NOTE — Research (Signed)
[place] Informed Consent   Subject Name: [facility]  Subject met inclusion and exclusion criteria.  The informed consent form, study requirements and expectations were reviewed with the subject and questions and concerns were addressed prior to the signing of the consent form.  The subject verbalized understanding of the trail requirements.  The subject agreed to participate in the [facility] trial and signed the informed consent.  The informed consent was obtained prior to performance of any protocol-specific procedures for the subject.  A copy of the signed informed consent was given to the subject and a copy was placed in the subject's medical record.  Philemon Kingdom D 02/08/2020, 7:25 AM

## 2020-02-08 NOTE — Interval H&P Note (Signed)
History and Physical Interval Note:  02/08/2020 8:14 AM  [facility]  has presented today for surgery, with the diagnosis of heart failure.  The various methods of treatment have been discussed with the patient and family. After consideration of risks, benefits and other options for treatment, the patient has consented to  Procedure(s): RIGHT/LEFT HEART CATH AND CORONARY/GRAFT ANGIOGRAPHY (N/A) as a surgical intervention.  The patient's history has been reviewed, patient examined, no change in status, stable for surgery.  I have reviewed the patient's chart and labs.  Questions were answered to the patient's satisfaction.    Cath Lab Visit (complete for each Cath Lab visit)  Clinical Evaluation Leading to the Procedure:   ACS: No.  Non-ACS:    Anginal Classification: CCS II  Anti-ischemic medical therapy: Minimal Therapy (1 class of medications)  Non-Invasive Test Results: No non-invasive testing performed  Prior CABG: Previous CABG        Lauree Chandler

## 2020-02-08 NOTE — Discharge Instructions (Signed)
Hold metformin 48 hours post cath.   Radial Site Care  This sheet gives you information about how to care for yourself after your procedure. Your health care provider may also give you more specific instructions. If you have problems or questions, contact your health care provider. What can I expect after the procedure? After the procedure, it is common to have:  Bruising and tenderness at the catheter insertion area. Follow these instructions at home: Medicines  Take over-the-counter and prescription medicines only as told by your health care provider. Insertion site care  Follow instructions from your health care provider about how to take care of your insertion site. Make sure you: ? Wash your hands with soap and water before you change your bandage (dressing). If soap and water are not available, use hand sanitizer. ? Change your dressing as told by your health care provider. ? Leave stitches (sutures), skin glue, or adhesive strips in place. These skin closures may need to stay in place for 2 weeks or longer. If adhesive strip edges start to loosen and curl up, you may trim the loose edges. Do not remove adhesive strips completely unless your health care provider tells you to do that.  Check your insertion site every day for signs of infection. Check for: ? Redness, swelling, or pain. ? Fluid or blood. ? Pus or a bad smell. ? Warmth.  Do not take baths, swim, or use a hot tub until your health care provider approves.  You may shower 24-48 hours after the procedure, or as directed by your health care provider. ? Remove the dressing and gently wash the site with plain soap and water. ? Pat the area dry with a clean towel. ? Do not rub the site. That could cause bleeding.  Do not apply powder or lotion to the site. Activity   For 24 hours after the procedure, or as directed by your health care provider: ? Do not flex or bend the affected arm. ? Do not push or pull heavy  objects with the affected arm. ? Do not drive yourself home from the hospital or clinic. You may drive 24 hours after the procedure unless your health care provider tells you not to. ? Do not operate machinery or power tools.  Do not lift anything that is heavier than 10 lb (4.5 kg), or the limit that you are told, until your health care provider says that it is safe.  Ask your health care provider when it is okay to: ? Return to work or school. ? Resume usual physical activities or sports. ? Resume sexual activity. General instructions  If the catheter site starts to bleed, raise your arm and put firm pressure on the site. If the bleeding does not stop, get help right away. This is a medical emergency.  If you went home on the same day as your procedure, a responsible adult should be with you for the first 24 hours after you arrive home.  Keep all follow-up visits as told by your health care provider. This is important. Contact a health care provider if:  You have a fever.  You have redness, swelling, or yellow drainage around your insertion site. Get help right away if:  You have unusual pain at the radial site.  The catheter insertion area swells very fast.  The insertion area is bleeding, and the bleeding does not stop when you hold steady pressure on the area.  Your arm or hand becomes pale, cool, tingly, or  numb. These symptoms may represent a serious problem that is an emergency. Do not wait to see if the symptoms will go away. Get medical help right away. Call your local emergency services (911 in the [place] Do not drive yourself to the hospital. Summary  After the procedure, it is common to have bruising and tenderness at the site.  Follow instructions from your health care provider about how to take care of your radial site wound. Check the wound every day for signs of infection.  Do not lift anything that is heavier than 10 lb (4.5 kg), or the limit that you are told,  until your health care provider says that it is safe. This information is not intended to replace advice given to you by your health care provider. Make sure you discuss any questions you have with your health care provider. Document Revised: 08/26/2017 Document Reviewed: 08/26/2017 Elsevier Patient Education  2020 [employer]  

## 2020-02-08 NOTE — Progress Notes (Signed)
Armboard applied to left wrist, site unremarkable. Pt ambulatory to bathroom, tolerated well

## 2020-02-08 NOTE — Progress Notes (Signed)
TRB removed, 2x2 gauze with tegaderm applied, site unremarkable, will continue to monitor

## 2020-02-09 ENCOUNTER — Encounter (HOSPITAL_COMMUNITY): Payer: Self-pay | Admitting: Cardiovascular Disease

## 2020-02-15 ENCOUNTER — Encounter: Payer: Self-pay | Admitting: Cardiology

## 2020-02-15 ENCOUNTER — Other Ambulatory Visit: Payer: Self-pay

## 2020-02-15 ENCOUNTER — Telehealth: Payer: Self-pay | Admitting: Cardiology

## 2020-02-15 ENCOUNTER — Ambulatory Visit: Payer: PPO | Admitting: Cardiology

## 2020-02-15 VITALS — BP 127/74 | HR 100 | Ht 70.0 in | Wt 207.0 lb

## 2020-02-15 DIAGNOSIS — I2510 Atherosclerotic heart disease of native coronary artery without angina pectoris: Secondary | ICD-10-CM | POA: Diagnosis not present

## 2020-02-15 DIAGNOSIS — I5022 Chronic systolic (congestive) heart failure: Secondary | ICD-10-CM | POA: Diagnosis not present

## 2020-02-15 DIAGNOSIS — I471 Supraventricular tachycardia, unspecified: Secondary | ICD-10-CM

## 2020-02-15 MED ORDER — METOPROLOL SUCCINATE ER 100 MG PO TB24: 150.0000 mg | ORAL_TABLET | Freq: Every day | ORAL | 1 refills | Status: DC

## 2020-02-15 NOTE — Telephone Encounter (Signed)
Left message for patient to call to discuss scheduling Cardiac MRI ordered by Dr. Harl Bowie

## 2020-02-15 NOTE — Progress Notes (Signed)
Clinical Summary Kenneth Randall is a 74 y.o.male seen today for follow up of the following medical problems.    1. Chronic systolic HF - from [facility] notes 12/2019 echo showed LVEF 20-25%, new diagnosis for patient - at that time had significant volume overload, succesfully diuresed and started on medical therapy.  - he is compliant with meds - no recent SOB/DOE   02/2020 cath: occluded LAD, occluded LCX, occluded RCA. SVG-RPDA, SVG-D2/distal LAD patent, sequential graft to OM1 patent. Mean PA 20, PCWP 10, LVEDP 7, CI 2.5  - last visit changed quinapril to entresto. Got a 30 day supply, insurance denied refills but he has a form for appeal with him today    2. PSVT - noted on prior zio patch done at [facility] - no recent palpitations    3. CAD - history of 5 vessel CABG in 1999 - recent cath as reported above, patent grafts.  - no symptoms     PMH 1. CAD 2. PSVT 3. Chronic sysotlic HF     No Known Allergies   Current Outpatient Medications  Medication Sig Dispense Refill  . acetaminophen (TYLENOL) 650 MG CR tablet Take 1,300 mg by mouth every 8 (eight) hours as needed for pain.    . Ascorbic Acid (VITAMIN C) 1000 MG tablet Take 1,000 mg by mouth daily.    Marland Kitchen aspirin EC 325 MG tablet Take 325 mg by mouth daily.    . Ca Carbonate-Mag Hydroxide (ROLAIDS PO) Take 1 tablet by mouth daily as needed (indigestion).    . Cholecalciferol (DIALYVITE VITAMIN D 5000) 125 MCG (5000 UT) capsule Take 5,000 Units by mouth daily.    . Coenzyme Q10 (COQ10) 100 MG CAPS Take 100 mg by mouth daily.    . furosemide (LASIX) 40 MG tablet Take 40 mg by mouth daily.    Marland Kitchen glipiZIDE (GLUCOTROL XL) 10 MG 24 hr tablet Take 10 mg by mouth daily.    . Melatonin 5 MG CAPS Take 5 mg by mouth at bedtime as needed (sleep).    . metFORMIN (GLUCOPHAGE-XR) 500 MG 24 hr tablet Take 1,000 mg by mouth 2 (two) times daily.    . metoprolol succinate (TOPROL-XL) 100 MG 24 hr tablet Take 100 mg by mouth  daily. Take with or immediately following a meal.    . rosuvastatin (CRESTOR) 10 MG tablet Take 10 mg by mouth at bedtime.    . sacubitril-valsartan (ENTRESTO) 49-51 MG Take 1 tablet by mouth 2 (two) times daily. 60 tablet 6  . zinc gluconate 50 MG tablet Take 50 mg by mouth daily.     No current facility-administered medications for this visit.        No Known Allergies    Family History  Problem Relation Age of Onset  . Heart attack Mother 80  . Diabetes Father   . Heart disease Father        died after bypass surgery  . Cancer Brother        jaw   . Stroke Paternal Grandmother      Social History Kenneth Randall reports that he quit smoking about 30 years ago. He has never used smokeless tobacco. Kenneth Randall has no history on file for alcohol use.   Review of Systems CONSTITUTIONAL: No weight loss, fever, chills, weakness or fatigue.  HEENT: Eyes: No visual loss, blurred vision, double vision or yellow sclerae.No hearing loss, sneezing, congestion, runny nose or sore throat.  SKIN: No rash or itching.  CARDIOVASCULAR: per hpi RESPIRATORY: No shortness of breath, cough or sputum.  GASTROINTESTINAL: No anorexia, nausea, vomiting or diarrhea. No abdominal pain or blood.  GENITOURINARY: No burning on urination, no polyuria NEUROLOGICAL: No headache, dizziness, syncope, paralysis, ataxia, numbness or tingling in the extremities. No change in bowel or bladder control.  MUSCULOSKELETAL: No muscle, back pain, joint pain or stiffness.  LYMPHATICS: No enlarged nodes. No history of splenectomy.  PSYCHIATRIC: No history of depression or anxiety.  ENDOCRINOLOGIC: No reports of sweating, cold or heat intolerance. No polyuria or polydipsia.  Marland Kitchen   Physical Examination Today's Vitals   02/15/20 0820  BP: 127/74  Pulse: 100  SpO2: 98%  Weight: 207 lb (93.9 kg)  Height: 5\' 10"  (1.778 m)   Body mass index is 29.7 kg/m.  Gen: resting comfortably, no acute distress HEENT: no  scleral icterus, pupils equal round and reactive, no palptable cervical adenopathy,  CV: RRR, no m/r/g, no jvd Resp: Clear to auscultation bilaterally GI: abdomen is soft, non-tender, non-distended, normal bowel sounds, no hepatosplenomegaly MSK: extremities are warm, no edema.  Skin: warm, no rash Neuro:  no focal deficits Psych: appropriate affect   Diagnostic Studies  Echo: 12/2019 Summary 1. Technically difficult study due to body habitus. 2. Echo contrast utilized to enhance endocardial border definition. 3. The left ventricle is mildly to moderately dilated in size with upper normal wall thickness. 4. The left ventricular systolic function is severely decreased, LVEF is visually estimated at 20-25%. 5. There is moderate mitral valve regurgitation. 6. There is mild to moderate aortic valve stenosis. 7. There is mild aortic regurgitation. 8. The left atrium is moderately dilated in size. 9. The right ventricle is normal in size, with normal systolic function. 10. The right atrium is mildly dilated in size.  02/2020 RHC/LHC  Prox RCA to Mid RCA lesion is 50% stenosed.  Mid RCA lesion is 100% stenosed.  SVG graft was visualized by angiography and is normal in caliber.  Ost LAD to Prox LAD lesion is 100% stenosed.  SVG graft was visualized by angiography and is normal in caliber.  Prox Cx lesion is 100% stenosed.  Mid LAD to Dist LAD lesion is 50% stenosed.   1. Severe triple vessel CAD s/p 5V CABG with 5/5 patent bypass grafts.  2. The LAD is occluded proximally. The LAD and diagonal fill from the patent vein graft. The LIMA does not appear to have been used for bypass.  3. The Circumflex is occluded in the mid vessel. The patent sequential vein graft fills the proximal and distal OM branches 4. The RCA is occluded in the mid to distal vessel. The distal RCA/PDA and PLA fills from left to right collaterals and from the patent vein graft 5. Normal filling pressures.    Recommendations: Continue medical management of CAD.   11/2019 [facility] Zio patch: min HR 57, Max HR 255, Avg HF 101.4.8% PVCs. Rare supraventricular ectopy, some runs of atach. Short runs of NSVT  Assessment and Plan  1. Chronic systolic HF - new diagnosis based on 12/2019 echo at [facility], LVEF 20-25% - recent cath with patent grafts, normal filling pressures - increase toprol to 150mg  daily. We will appeal his insurance denying his entresto and see if he can stay on it - obtain cardiac MRI in setting of systolic HF, evalute for further his cardiomyopathy  2. CAD - no symptoms, recent cath with patent grafts - continue current meds, likely lower ASA to 81mg  daily next visit as I don't  see a reason for ongoing dose based on prior records  3. PSVT - no symptoms, continue beta blocker     Arnoldo Lenis, M.D.

## 2020-02-15 NOTE — Patient Instructions (Signed)
Medication Instructions:   START TAKING METOPROLOL 150 MG ONCE A DAY   *If you need a refill on your cardiac medications before your next appointment, please call your pharmacy*   Lab Work: NONE ORDERED  TODAY   If you have labs (blood work) drawn today and your tests are completely normal, you will receive your results only by: Marland Kitchen MyChart Message (if you have MyChart) OR . A paper copy in the mail If you have any lab test that is abnormal or we need to change your treatment, we will call you to review the results.   Testing/Procedures: CARDIAC MRI     Follow-Up: At [facility], you and your health needs are our priority.  As part of our continuing mission to provide you with exceptional heart care, we have created designated Provider Care Teams.  These Care Teams include your primary Cardiologist (physician) and Advanced Practice Providers (APPs -  Physician Assistants and Nurse Practitioners) who all work together to provide you with the care you need, when you need it.  We recommend signing up for the patient portal called "MyChart".  Sign up information is provided on this After Visit Summary.  MyChart is used to connect with patients for Virtual Visits (Telemedicine).  Patients are able to view lab/test results, encounter notes, upcoming appointments, etc.  Non-urgent messages can be sent to your provider as well.   To learn more about what you can do with MyChart, go to [email or web address].    Your next appointment:   3 week(s)  The format for your next appointment:   In Person  Provider:   You may see Dr. Harl Bowie  or one of the following Advanced Practice Providers on your designated Care Team:    Bernerd Pho, PA-C   Ermalinda Barrios, [place]     Other Instructions

## 2020-02-16 NOTE — Telephone Encounter (Signed)
Left message for patient to call to discuss scheduling Cardiac MRI ordered by Dr. Harl Bowie

## 2020-02-17 ENCOUNTER — Ambulatory Visit: Payer: PPO | Admitting: Cardiology

## 2020-02-17 ENCOUNTER — Encounter: Payer: Self-pay | Admitting: Cardiology

## 2020-02-17 NOTE — Telephone Encounter (Signed)
Left message for patient to call regarding appointment for Cardiac MRI scheduled Monday 03/26/20 at 8:00 am at [facility]---arrival time is 7:30 am 1st floor admissions office.

## 2020-02-17 NOTE — Telephone Encounter (Signed)
Patient returned my call and we discussed the appointment for the Cardiac MRI ---will mail information to patient and it is also in My Chart.  Patient voiced his understanding.

## 2020-03-09 ENCOUNTER — Ambulatory Visit: Payer: PPO | Admitting: Family Medicine

## 2020-03-23 ENCOUNTER — Telehealth (HOSPITAL_COMMUNITY): Payer: Self-pay | Admitting: Emergency Medicine

## 2020-03-23 NOTE — Telephone Encounter (Signed)
Reaching out to patient to offer assistance regarding upcoming cardiac imaging study; pt verbalizes understanding of appt date/time, parking situation and where to check in,, and verified current allergies; name and call back number provided for further questions should they arise Kenneth Bond RN Navigator Cardiac Imaging [facility] [phone] office [phone] cell   Some claustro but can tolerate it. Denies implants, only cardiac surgery/clips

## 2020-03-26 ENCOUNTER — Other Ambulatory Visit: Payer: Self-pay

## 2020-03-26 ENCOUNTER — Ambulatory Visit (HOSPITAL_COMMUNITY)
Admission: RE | Admit: 2020-03-26 | Discharge: 2020-03-26 | Disposition: A | Discharge status: Home / Self Care | Payer: PPO | Source: Ambulatory Visit | Attending: Cardiology | Admitting: Cardiology

## 2020-03-26 DIAGNOSIS — I5022 Chronic systolic (congestive) heart failure: Secondary | ICD-10-CM | POA: Diagnosis not present

## 2020-03-26 DIAGNOSIS — I519 Heart disease, unspecified: Secondary | ICD-10-CM | POA: Diagnosis not present

## 2020-03-26 DIAGNOSIS — I429 Cardiomyopathy, unspecified: Secondary | ICD-10-CM | POA: Diagnosis not present

## 2020-03-26 DIAGNOSIS — I340 Nonrheumatic mitral (valve) insufficiency: Secondary | ICD-10-CM | POA: Diagnosis not present

## 2020-03-26 DIAGNOSIS — I728 Aneurysm of other specified arteries: Secondary | ICD-10-CM | POA: Diagnosis not present

## 2020-03-26 MED ORDER — GADOBUTROL 1 MMOL/ML IV SOLN
10.0000 mL | Freq: Once | INTRAVENOUS | Status: AC | PRN
  Administered 2020-03-26: 10 mL via INTRAVENOUS

## 2020-03-30 ENCOUNTER — Telehealth: Payer: Self-pay | Admitting: *Deleted

## 2020-03-30 NOTE — Telephone Encounter (Signed)
Pt has been notified of Cardiac MRI results by phone with verbal understanding. Confirmed appt with Levell July, NP 04/02/20 @ 9 am. Pt thanked me for the call. The patient has been notified of the result and verbalized understanding. All questions (if any) were answered. Julaine Hua, [place] 03/29/2020 10:15 AM

## 2020-03-30 NOTE — Telephone Encounter (Signed)
-----   Message from Arnoldo Lenis, MD sent at 03/27/2020  2:07 PM EDT ----- MRI shows some old muscle damage from prior artery blockages but no active inflammation, continue current meds   Zandra Abts MD

## 2020-04-01 NOTE — Progress Notes (Signed)
Cardiology Office Note  Date: 04/02/2020   ID: Kenneth, Randall 09/12/1945, MRN [id number]  PCP:  Rory Percy, MD  Cardiologist:  Carlyle Dolly, MD Electrophysiologist:  None   Chief Complaint: Follow-up chronic systolic heart failure  History of Present Illness: Kenneth Randall is a 74 y.o. male with a history of chronic systolic heart failure, PSVT (noted on prior Zio patch done at [facility]), CAD (s/p CABG 1999 times 5 vessel).  Chronic systolic heart failure: [facility] notes 12/2019 echo LVEF 20 to 25%.  New diagnosis for patient.  At that time significant volume overload, successfully diuresed and started on medical therapy / Entresto.  Cardiac catheterization 02/2020: LAD occluded, LCx occluded, RCA occluded.  SVG-RPDA, SVG-D2/distal LAD patent, SVG sequential graft to OM1 patent, mean PA 20, PCWP 10, LVEDP, 7, CI 2.5.  Last encounter with Dr. Harl Bowie on 02/15/2020.  He was compliant with his heart failure medications and no shortness of breath or DOE.  No recent palpitations, no cardiac symptoms of chest pain, pressure, tightness, or radiation. Cardiac MRI ordered to further evaluate cardiomyopathy. Plans to decrease aspirin to 81 mg at next visit.  He is here for follow-up status post recent cardiac MRI.  I reviewed the results with him.  He and his wife both verbalized understanding.  He is asking if it is okay to resume low intensity exercise such as walking.  He denies any anginal or exertional symptoms, palpitations or arrhythmias, orthostatic symptoms, stroke, TIA like symptoms, PND, orthopnea, bleeding, lower extremity edema.  Blood pressure well controlled today with a heart rate of 75.  We will decrease aspirin to 81 mg daily as requested by Dr. Harl Bowie    Current Outpatient Medications  Medication Sig Dispense Refill  . acetaminophen (TYLENOL) 650 MG CR tablet Take 1,300 mg by mouth every 8 (eight) hours as needed for pain.    . Ascorbic Acid (VITAMIN C) 1000 MG tablet Take  1,000 mg by mouth daily.    . Ca Carbonate-Mag Hydroxide (ROLAIDS PO) Take 1 tablet by mouth daily as needed (indigestion).    . Cholecalciferol (DIALYVITE VITAMIN D 5000) 125 MCG (5000 UT) capsule Take 5,000 Units by mouth daily.    . Coenzyme Q10 (COQ10) 100 MG CAPS Take 100 mg by mouth daily.    . furosemide (LASIX) 40 MG tablet Take 40 mg by mouth daily.    Marland Kitchen glipiZIDE (GLUCOTROL XL) 10 MG 24 hr tablet Take 10 mg by mouth daily.    . Melatonin 5 MG CAPS Take 5 mg by mouth at bedtime as needed (sleep).    . metFORMIN (GLUCOPHAGE-XR) 500 MG 24 hr tablet Take 1,000 mg by mouth 2 (two) times daily.    . metoprolol succinate (TOPROL-XL) 100 MG 24 hr tablet Take 1.5 tablets (150 mg total) by mouth daily. Take with or immediately following a meal. 135 tablet 1  . rosuvastatin (CRESTOR) 10 MG tablet Take 10 mg by mouth at bedtime.    . sacubitril-valsartan (ENTRESTO) 49-51 MG Take 1 tablet by mouth 2 (two) times daily. 60 tablet 6  . zinc gluconate 50 MG tablet Take 50 mg by mouth daily.    Marland Kitchen aspirin EC 81 MG tablet Take 1 tablet (81 mg total) by mouth daily. Swallow whole.     No current facility-administered medications for this visit.   Allergies:  Patient has no known allergies.   Social History: The patient  reports that he quit smoking about 30 years ago. He has  never used smokeless tobacco.   Family History: The patient's family history includes Cancer in his brother; Diabetes in his father; Heart attack (age of onset: 56) in his mother; Heart disease in his father; Stroke in his paternal grandmother.   ROS:  Please see the history of present illness. Otherwise, complete review of systems is positive for none.  All other systems are reviewed and negative.   Physical Exam: VS:  BP 128/80   Pulse 75   Ht 5\' 10"  (1.778 m)   Wt 212 lb 9.6 oz (96.4 kg)   SpO2 97%   BMI 30.50 kg/m , BMI Body mass index is 30.5 kg/m.  Wt Readings from Last 3 Encounters:  04/02/20 212 lb 9.6 oz (96.4  kg)  02/15/20 207 lb (93.9 kg)  02/08/20 200 lb (90.7 kg)    General: Obese patient appears comfortable at rest. Neck: Supple, no elevated JVP or carotid bruits, no thyromegaly. Lungs: Clear to auscultation, nonlabored breathing at rest. Cardiac: Regular rate and rhythm, no S3 or significant systolic murmur, no pericardial rub. Extremities: No pitting edema, distal pulses 2+. Skin: Warm and dry. Musculoskeletal: No kyphosis. Neuropsychiatric: Alert and oriented x3, affect grossly appropriate.  ECG:    Recent Labwork: 02/01/2020: BUN 30; Creat 1.02; Platelets 229 02/08/2020: Hemoglobin 13.9; Potassium 4.6; Sodium 140  No results found for: CHOL, TRIG, HDL, CHOLHDL, VLDL, LDLCALC, LDLDIRECT  Other Studies Reviewed Today:  Coronary MRI 03/26/2020 IMPRESSION: 1. Mild LV dilatation with severe systolic dysfunction (EF 19%). 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 Hypokinesis of mid anterior/anterolateral walls and apical anterior/septal/lateral walls. Akinesis of mid inferolateral wall  2. Subendocardial late gadolinium enhancement consistent with prior infarcts in LAD and LCX territories. LGE is <50% transmural suggesting viability in mid anterior/anterolateral walls and apical septal/anterior/inferior/lateral walls. LGE >50% transmural suggesting nonviability at apex. In addition, there is significant thinning in mid inferolateral wall (wall thickness 15mm), suggesting nonviability.  3.  Normal RV size and systolic function (EF 41%)  4.  Mild mitral regurgitation (regurgitant fraction 19%)    Echo: 12/2019 Summary 1. Technically difficult study due to body habitus. 2. Echo contrast utilized to enhance endocardial border definition. 3. The left ventricle is mildly to moderately dilated in size with upper normal wall thickness. 4. The left ventricular systolic function is severely decreased, LVEF is visually estimated at 20-25%. 5. There is moderate mitral valve regurgitation. 6.  There is mild to moderate aortic valve stenosis. 7. There is mild aortic regurgitation. 8. The left atrium is moderately dilated in size. 9. The right ventricle is normal in size, with normal systolic function. 10. The right atrium is mildly dilated in size.  02/2020 RHC/LHC  Prox RCA to Mid RCA lesion is 50% stenosed.  Mid RCA lesion is 100% stenosed.  SVG graft was visualized by angiography and is normal in caliber.  Ost LAD to Prox LAD lesion is 100% stenosed.  SVG graft was visualized by angiography and is normal in caliber.  Prox Cx lesion is 100% stenosed.  Mid LAD to Dist LAD lesion is 50% stenosed.  1. Severe triple vessel CAD s/p 5V CABG with 5/5 patent bypass grafts.  2. The LAD is occluded proximally. The LAD and diagonal fill from the patent vein graft. The LIMA does not appear to have been used for bypass.  3. The Circumflex is occluded in the mid vessel. The patent sequential vein graft fills the proximal and distal OM branches 4. The RCA is occluded in the mid to distal  vessel. The distal RCA/PDA and PLA fills from left to right collaterals and from the patent vein graft 5. Normal filling pressures.   Recommendations: Continue medical management of CAD.   11/2019 [facility] Zio patch: min HR 57, Max HR 255, Avg HF 101.4.8% PVCs. Rare supraventricular ectopy, some runs of atach. Short runs of NSVT  Assessment and Plan:  1. Chronic systolic heart failure ([place])   2. CAD in native artery   3. PSVT (paroxysmal supraventricular tachycardia) ([place])   4. Ischemic cardiomyopathy    1. Chronic systolic heart failure (HCC) Recent cardiac MRIDemonstrated mild LV dilation with severe systolic dysfunction EF 38%, apical aneurysm, hypokinesis of the mid anterior/anterolateral walls and apical anterior/septal/lateral walls, akinesis of the mid inferior lateral wall.  Subendocardial late gadolinium enhancement consistent with prior infarction LAD and LCx territories.  Continue  Entresto 49/51 mg p.o. twice daily, Toprol-XL 150 mg daily.  Lasix 40 mg daily.  2. CAD in native artery Cardiac catheterization 02/2020: LAD occluded, LCx occluded, RCA occluded.  SVG-RPDA, SVG-D2/distal LAD patent, SVG sequential graft to OM1 patent, mean PA 20, PCWP 10, LVEDP, 7, CI 2.5. Denies any anginal symptoms.  Continue aspirin 81 mg daily.  Rosuvastatin 10 mg daily.    3. PSVT (paroxysmal supraventricular tachycardia) (HCC) Recent palpitations or arrhythmias.  Continue Toprol-XL 150 mg p.o. daily  4. Ischemic cardiomyopathy Past history of CABG x5.  Recent echocardiogram and cardiac MRI demonstrating low EF.  Continue current cardiac and heart failure medications as listed above in #1 and 2.  Medication Adjustments/Labs and Tests Ordered: Current medicines are reviewed at length with the patient today.  Concerns regarding medicines are outlined above.   Disposition: Follow-up with Dr. Harl Bowie or APP 6 months.  Signed, Levell July, NP 04/02/2020 9:56 AM    [facility] [place], [place], [place] [address] Phone: [phone]; Fax: [phone]

## 2020-04-02 ENCOUNTER — Encounter: Payer: Self-pay | Admitting: Family Medicine

## 2020-04-02 ENCOUNTER — Ambulatory Visit (INDEPENDENT_AMBULATORY_CARE_PROVIDER_SITE_OTHER): Payer: PPO | Admitting: Family Medicine

## 2020-04-02 VITALS — BP 128/80 | HR 75 | Ht 70.0 in | Wt 212.6 lb

## 2020-04-02 DIAGNOSIS — I255 Ischemic cardiomyopathy: Secondary | ICD-10-CM | POA: Diagnosis not present

## 2020-04-02 DIAGNOSIS — I471 Supraventricular tachycardia: Secondary | ICD-10-CM | POA: Diagnosis not present

## 2020-04-02 DIAGNOSIS — I5022 Chronic systolic (congestive) heart failure: Secondary | ICD-10-CM | POA: Diagnosis not present

## 2020-04-02 DIAGNOSIS — I2510 Atherosclerotic heart disease of native coronary artery without angina pectoris: Secondary | ICD-10-CM | POA: Diagnosis not present

## 2020-04-02 MED ORDER — ASPIRIN EC 81 MG PO TBEC: 81.0000 mg | DELAYED_RELEASE_TABLET | Freq: Every day | ORAL | Status: AC

## 2020-04-02 NOTE — Patient Instructions (Addendum)
Medication Instructions:   Decrease Aspirin to 81mg  daily.   Continue all current medications.  Labwork: none  Testing/Procedures: none  Follow-Up: 6 months   Any Other Special Instructions Will Be Listed Below (If Applicable).  If you need a refill on your cardiac medications before your next appointment, please call your pharmacy.

## 2020-05-02 DIAGNOSIS — E1165 Type 2 diabetes mellitus with hyperglycemia: Secondary | ICD-10-CM | POA: Diagnosis not present

## 2020-05-02 DIAGNOSIS — I10 Essential (primary) hypertension: Secondary | ICD-10-CM | POA: Diagnosis not present

## 2020-05-02 DIAGNOSIS — E7800 Pure hypercholesterolemia, unspecified: Secondary | ICD-10-CM | POA: Diagnosis not present

## 2020-05-02 DIAGNOSIS — R5382 Chronic fatigue, unspecified: Secondary | ICD-10-CM | POA: Diagnosis not present

## 2020-05-02 DIAGNOSIS — D519 Vitamin B12 deficiency anemia, unspecified: Secondary | ICD-10-CM | POA: Diagnosis not present

## 2020-05-07 DIAGNOSIS — E1165 Type 2 diabetes mellitus with hyperglycemia: Secondary | ICD-10-CM | POA: Diagnosis not present

## 2020-05-07 DIAGNOSIS — Z6831 Body mass index (BMI) 31.0-31.9, adult: Secondary | ICD-10-CM | POA: Diagnosis not present

## 2020-05-07 DIAGNOSIS — I10 Essential (primary) hypertension: Secondary | ICD-10-CM | POA: Diagnosis not present

## 2020-05-07 DIAGNOSIS — K5900 Constipation, unspecified: Secondary | ICD-10-CM | POA: Diagnosis not present

## 2020-05-07 DIAGNOSIS — I509 Heart failure, unspecified: Secondary | ICD-10-CM | POA: Diagnosis not present

## 2020-08-07 ENCOUNTER — Other Ambulatory Visit: Payer: Self-pay | Admitting: Cardiology

## 2020-08-08 ENCOUNTER — Other Ambulatory Visit: Payer: Self-pay | Admitting: Cardiology

## 2020-10-02 DIAGNOSIS — M9903 Segmental and somatic dysfunction of lumbar region: Secondary | ICD-10-CM | POA: Diagnosis not present

## 2020-10-02 DIAGNOSIS — M5440 Lumbago with sciatica, unspecified side: Secondary | ICD-10-CM | POA: Diagnosis not present

## 2020-10-02 DIAGNOSIS — M47816 Spondylosis without myelopathy or radiculopathy, lumbar region: Secondary | ICD-10-CM | POA: Diagnosis not present

## 2020-10-03 DIAGNOSIS — M5440 Lumbago with sciatica, unspecified side: Secondary | ICD-10-CM | POA: Diagnosis not present

## 2020-10-03 DIAGNOSIS — M9903 Segmental and somatic dysfunction of lumbar region: Secondary | ICD-10-CM | POA: Diagnosis not present

## 2020-10-03 DIAGNOSIS — M47816 Spondylosis without myelopathy or radiculopathy, lumbar region: Secondary | ICD-10-CM | POA: Diagnosis not present

## 2020-10-05 ENCOUNTER — Encounter: Payer: Self-pay | Admitting: *Deleted

## 2020-10-05 DIAGNOSIS — M5440 Lumbago with sciatica, unspecified side: Secondary | ICD-10-CM | POA: Diagnosis not present

## 2020-10-05 DIAGNOSIS — M9903 Segmental and somatic dysfunction of lumbar region: Secondary | ICD-10-CM | POA: Diagnosis not present

## 2020-10-05 DIAGNOSIS — M47816 Spondylosis without myelopathy or radiculopathy, lumbar region: Secondary | ICD-10-CM | POA: Diagnosis not present

## 2020-10-08 ENCOUNTER — Other Ambulatory Visit: Payer: Self-pay

## 2020-10-08 ENCOUNTER — Encounter: Payer: Self-pay | Admitting: Cardiology

## 2020-10-08 ENCOUNTER — Ambulatory Visit: Payer: PPO | Admitting: Cardiology

## 2020-10-08 VITALS — BP 160/92 | HR 84 | Ht 70.0 in | Wt 224.4 lb

## 2020-10-08 DIAGNOSIS — I5022 Chronic systolic (congestive) heart failure: Secondary | ICD-10-CM

## 2020-10-08 DIAGNOSIS — I2510 Atherosclerotic heart disease of native coronary artery without angina pectoris: Secondary | ICD-10-CM

## 2020-10-08 DIAGNOSIS — I471 Supraventricular tachycardia: Secondary | ICD-10-CM

## 2020-10-08 MED ORDER — SPIRONOLACTONE 25 MG PO TABS
12.5000 mg | ORAL_TABLET | Freq: Every day | ORAL | 1 refills | Status: DC
Start: 2020-10-08 — End: 2020-11-28

## 2020-10-08 NOTE — Progress Notes (Signed)
Clinical Summary Mr. Mollett is a 75 y.o.male seen today for follow up of the following medical problems.    1. Chronic systolic HF - from [facility] [id number] showed LVEF 20-25%, new diagnosis for patient - at that time had significant volume overload, succesfully diuresed and started on medical therapy. - he is compliant with meds - no recent SOB/DOE   02/2020 cath: occluded LAD, occluded LCX, occluded RCA. SVG-RPDA, SVG-D2/distal LAD patent, sequential graft to OM1 patent. Mean PA 20, PCWP 10, LVEDP 7, CI 2.5  - 03/2020 CMRI: consistent with prior scar, LVEF 30% - last visit changed quinapril to entresto. Got a 30 day supply, insurance denied refills but he has a form for appeal with him today  - no recent edema, no SOB or DOE - compliant with meds  2. PSVT -noted on prior zio patch done at [facility]   - no palpitations   3. CAD - history of 5 vessel CABG in 1999 - recent cath as reported above, patent grafts.   - no chest pains.      PMH 1. CAD 2. PSVT 3. Chronic sysotlic HF    Past Medical History:  Diagnosis Date  . CAD (coronary artery disease)   . CHF (congestive heart failure) ([place])   . Ischemic cardiomyopathy   . PSVT (paroxysmal supraventricular tachycardia) ([facility])      No Known Allergies   Current Outpatient Medications  Medication Sig Dispense Refill  . acetaminophen (TYLENOL) 650 MG CR tablet Take 1,300 mg by mouth every 8 (eight) hours as needed for pain.    . Ascorbic Acid (VITAMIN C) 1000 MG tablet Take 1,000 mg by mouth daily.    Marland Kitchen aspirin EC 81 MG tablet Take 1 tablet (81 mg total) by mouth daily. Swallow whole.    . Ca Carbonate-Mag Hydroxide (ROLAIDS PO) Take 1 tablet by mouth daily as needed (indigestion).    . Cholecalciferol (DIALYVITE VITAMIN D 5000) 125 MCG (5000 UT) capsule Take 5,000 Units by mouth daily.    . Coenzyme Q10 (COQ10) 100 MG CAPS Take 100 mg by mouth daily.    Marland Kitchen ENTRESTO 49-51 MG TAKE 1 TABLET BY  MOUTH 2 (TWO) TIMES DAILY. 60 tablet 6  . furosemide (LASIX) 40 MG tablet Take 40 mg by mouth daily.    Marland Kitchen glipiZIDE (GLUCOTROL XL) 10 MG 24 hr tablet Take 10 mg by mouth daily.    . Melatonin 5 MG CAPS Take 5 mg by mouth at bedtime as needed (sleep).    . metFORMIN (GLUCOPHAGE-XR) 500 MG 24 hr tablet Take 1,000 mg by mouth 2 (two) times daily.    . metoprolol succinate (TOPROL-XL) 100 MG 24 hr tablet TAKE 1.5 TABLETS (150 MG TOTAL) BY MOUTH DAILY. TAKE WITH OR IMMEDIATELY FOLLOWING A MEAL. 135 tablet 1  . rosuvastatin (CRESTOR) 10 MG tablet Take 10 mg by mouth at bedtime.    Marland Kitchen zinc gluconate 50 MG tablet Take 50 mg by mouth daily.     No current facility-administered medications for this visit.        No Known Allergies    Family History  Problem Relation Age of Onset  . Heart attack Mother 18  . Diabetes Father   . Heart disease Father        died after bypass surgery  . Cancer Brother        jaw   . Stroke Paternal Grandmother      Social History Mr. Schrecengost reports that he  quit smoking about 30 years ago. He has never used smokeless tobacco. Mr. Casebeer has no history on file for alcohol use.   Review of Systems CONSTITUTIONAL: No weight loss, fever, chills, weakness or fatigue.  HEENT: Eyes: No visual loss, blurred vision, double vision or yellow sclerae.No hearing loss, sneezing, congestion, runny nose or sore throat.  SKIN: No rash or itching.  CARDIOVASCULAR: per hpi RESPIRATORY: No shortness of breath, cough or sputum.  GASTROINTESTINAL: No anorexia, nausea, vomiting or diarrhea. No abdominal pain or blood.  GENITOURINARY: No burning on urination, no polyuria NEUROLOGICAL: No headache, dizziness, syncope, paralysis, ataxia, numbness or tingling in the extremities. No change in bowel or bladder control.  MUSCULOSKELETAL: No muscle, back pain, joint pain or stiffness.  LYMPHATICS: No enlarged nodes. No history of splenectomy.  PSYCHIATRIC: No history of depression  or anxiety.  ENDOCRINOLOGIC: No reports of sweating, cold or heat intolerance. No polyuria or polydipsia.  Marland Kitchen   Physical Examination Today's Vitals   10/08/20 0908  BP: (!) 160/92  Pulse: 84  SpO2: 97%  Weight: 224 lb 6.4 oz (101.8 kg)  Height: 5\' 10"  (1.778 m)   Body mass index is 32.2 kg/m.  Gen: resting comfortably, no acute distress HEENT: no scleral icterus, pupils equal round and reactive, no palptable cervical adenopathy,  CV: RRR, no m/r/g, no jvd Resp: Clear to auscultation bilaterally GI: abdomen is soft, non-tender, non-distended, normal bowel sounds, no hepatosplenomegaly MSK: extremities are warm, no edema.  Skin: warm, no rash Neuro:  no focal deficits Psych: appropriate affect   Diagnostic Studies Echo: 12/2019 Summary 1. Technically difficult study due to body habitus. 2. Echo contrast utilized to enhance endocardial border definition. 3. The left ventricle is mildly to moderately dilated in size with upper normal wall thickness. 4. The left ventricular systolic function is severely decreased, LVEF is visually estimated at 20-25%. 5. There is moderate mitral valve regurgitation. 6. There is mild to moderate aortic valve stenosis. 7. There is mild aortic regurgitation. 8. The left atrium is moderately dilated in size. 9. The right ventricle is normal in size, with normal systolic function. 10. The right atrium is mildly dilated in size.  02/2020 RHC/LHC  Prox RCA to Mid RCA lesion is 50% stenosed.  Mid RCA lesion is 100% stenosed.  SVG graft was visualized by angiography and is normal in caliber.  Ost LAD to Prox LAD lesion is 100% stenosed.  SVG graft was visualized by angiography and is normal in caliber.  Prox Cx lesion is 100% stenosed.  Mid LAD to Dist LAD lesion is 50% stenosed.  1. Severe triple vessel CAD s/p 5V CABG with 5/5 patent bypass grafts.  2. The LAD is occluded proximally. The LAD and diagonal fill from the patent vein graft.  The LIMA does not appear to have been used for bypass.  3. The Circumflex is occluded in the mid vessel. The patent sequential vein graft fills the proximal and distal OM branches 4. The RCA is occluded in the mid to distal vessel. The distal RCA/PDA and PLA fills from left to right collaterals and from the patent vein graft 5. Normal filling pressures.   Recommendations: Continue medical management of CAD.   11/2019 [facility] Zio patch: min HR 57, Max HR 255, Avg HF 101.4.8% PVCs. Rare supraventricular ectopy, some runs of atach. Short runs of NSVT  03/2020 CMRI IMPRESSION: 1. Mild LV dilatation with severe systolic dysfunction (EF 25%). 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 Hypokinesis of mid anterior/anterolateral walls and  apical anterior/septal/lateral walls. Akinesis of mid inferolateral wall  2. Subendocardial late gadolinium enhancement consistent with prior infarcts in LAD and LCX territories. LGE is <50% transmural suggesting viability in mid anterior/anterolateral walls and apical septal/anterior/inferior/lateral walls. LGE >50% transmural suggesting nonviability at apex. In addition, there is significant thinning in mid inferolateral wall (wall thickness 24mm), suggesting nonviability.  3.  Normal RV size and systolic function (EF 15%)  4.  Mild mitral regurgitation (regurgitant fraction 19%)     Assessment and Plan  1. Chronic systolic HF - new diagnosis based on 12/2019 echo at [facility], LVEF 20-25% - recent cath with patent grafts, normal filling pressures - start aldactone 12.5mg  daily - at next f/u would either titrate entresto further or aldactone pending labs and bp's - will need repeat echo arranged after next visit  2. CAD - no symptoms, recent cath with patent grafts - continue current meds  3. PSVT - denies symptoms, continue beta blocker.      Arnoldo Lenis, M.D.

## 2020-10-08 NOTE — Patient Instructions (Addendum)
Your physician recommends that you schedule a follow-up appointment in: [place] has recommended you make the following change in your medication:   START ALDACTONE 12.5 MG DAILY   Your physician recommends that you return for lab work in: 2 WEEKS BMP   Thank you for choosing [facility]!!

## 2020-10-09 DIAGNOSIS — M5440 Lumbago with sciatica, unspecified side: Secondary | ICD-10-CM | POA: Diagnosis not present

## 2020-10-09 DIAGNOSIS — M47816 Spondylosis without myelopathy or radiculopathy, lumbar region: Secondary | ICD-10-CM | POA: Diagnosis not present

## 2020-10-09 DIAGNOSIS — M9903 Segmental and somatic dysfunction of lumbar region: Secondary | ICD-10-CM | POA: Diagnosis not present

## 2020-10-12 DIAGNOSIS — M47816 Spondylosis without myelopathy or radiculopathy, lumbar region: Secondary | ICD-10-CM | POA: Diagnosis not present

## 2020-10-12 DIAGNOSIS — M9903 Segmental and somatic dysfunction of lumbar region: Secondary | ICD-10-CM | POA: Diagnosis not present

## 2020-10-12 DIAGNOSIS — M5440 Lumbago with sciatica, unspecified side: Secondary | ICD-10-CM | POA: Diagnosis not present

## 2020-10-17 DIAGNOSIS — M5440 Lumbago with sciatica, unspecified side: Secondary | ICD-10-CM | POA: Diagnosis not present

## 2020-10-17 DIAGNOSIS — M47816 Spondylosis without myelopathy or radiculopathy, lumbar region: Secondary | ICD-10-CM | POA: Diagnosis not present

## 2020-10-17 DIAGNOSIS — M9903 Segmental and somatic dysfunction of lumbar region: Secondary | ICD-10-CM | POA: Diagnosis not present

## 2020-10-22 DIAGNOSIS — M5440 Lumbago with sciatica, unspecified side: Secondary | ICD-10-CM | POA: Diagnosis not present

## 2020-10-22 DIAGNOSIS — M47816 Spondylosis without myelopathy or radiculopathy, lumbar region: Secondary | ICD-10-CM | POA: Diagnosis not present

## 2020-10-22 DIAGNOSIS — M9903 Segmental and somatic dysfunction of lumbar region: Secondary | ICD-10-CM | POA: Diagnosis not present

## 2020-10-24 ENCOUNTER — Other Ambulatory Visit: Payer: Self-pay | Admitting: Cardiology

## 2020-10-24 DIAGNOSIS — I5022 Chronic systolic (congestive) heart failure: Secondary | ICD-10-CM | POA: Diagnosis not present

## 2020-10-24 LAB — BASIC METABOLIC PANEL WITH GFR
BUN/Creatinine Ratio: 25 (calc) — ABNORMAL HIGH (ref 6–22)
BUN: 34 mg/dL — ABNORMAL HIGH (ref 7–25)
CO2: 28 mmol/L (ref 20–32)
Calcium: 9.9 mg/dL (ref 8.6–10.3)
Chloride: 101 mmol/L (ref 98–110)
Creat: 1.36 mg/dL — ABNORMAL HIGH (ref 0.70–1.18)
GFR, Est African American: 59 mL/min/{1.73_m2} — ABNORMAL LOW (ref >=60)
GFR, Est Non African American: 51 mL/min/{1.73_m2} — ABNORMAL LOW (ref >=60)
Glucose, Bld: 140 mg/dL — ABNORMAL HIGH (ref 65–139)
Potassium: 5.1 mmol/L (ref 3.5–5.3)
Sodium: 137 mmol/L (ref 135–146)

## 2020-10-25 ENCOUNTER — Telehealth: Payer: Self-pay | Admitting: *Deleted

## 2020-10-25 DIAGNOSIS — I5022 Chronic systolic (congestive) heart failure: Secondary | ICD-10-CM

## 2020-10-25 DIAGNOSIS — M5440 Lumbago with sciatica, unspecified side: Secondary | ICD-10-CM | POA: Diagnosis not present

## 2020-10-25 DIAGNOSIS — M47816 Spondylosis without myelopathy or radiculopathy, lumbar region: Secondary | ICD-10-CM | POA: Diagnosis not present

## 2020-10-25 DIAGNOSIS — M9903 Segmental and somatic dysfunction of lumbar region: Secondary | ICD-10-CM | POA: Diagnosis not present

## 2020-10-25 NOTE — Telephone Encounter (Signed)
-----   Message from Arnoldo Lenis, MD sent at 10/25/2020  9:11 AM EDT ----- Some decrease in renal function on labs. Can he lower his lasix to 20mg  daily, recheck BMET in 2 weeks  Zandra Abts MD

## 2020-10-29 MED ORDER — FUROSEMIDE 20 MG PO TABS: 20.0000 mg | ORAL_TABLET | Freq: Every day | ORAL | 6 refills | Status: DC

## 2020-10-29 NOTE — Telephone Encounter (Signed)
Laurine Blazer, LPN  09/09/154 1:53 AM EDT Back to Top     Notified, copy to pcp. Will send new 20mg  tab to [facility] now. Lab order entered for [facility] - he will go around 11/12/2020.

## 2020-10-30 DIAGNOSIS — M9903 Segmental and somatic dysfunction of lumbar region: Secondary | ICD-10-CM | POA: Diagnosis not present

## 2020-10-30 DIAGNOSIS — M5440 Lumbago with sciatica, unspecified side: Secondary | ICD-10-CM | POA: Diagnosis not present

## 2020-10-30 DIAGNOSIS — M47816 Spondylosis without myelopathy or radiculopathy, lumbar region: Secondary | ICD-10-CM | POA: Diagnosis not present

## 2020-11-01 DIAGNOSIS — E1165 Type 2 diabetes mellitus with hyperglycemia: Secondary | ICD-10-CM | POA: Diagnosis not present

## 2020-11-01 DIAGNOSIS — E7801 Familial hypercholesterolemia: Secondary | ICD-10-CM | POA: Diagnosis not present

## 2020-11-01 DIAGNOSIS — I10 Essential (primary) hypertension: Secondary | ICD-10-CM | POA: Diagnosis not present

## 2020-11-01 DIAGNOSIS — Z1329 Encounter for screening for other suspected endocrine disorder: Secondary | ICD-10-CM | POA: Diagnosis not present

## 2020-11-01 DIAGNOSIS — E7800 Pure hypercholesterolemia, unspecified: Secondary | ICD-10-CM | POA: Diagnosis not present

## 2020-11-01 DIAGNOSIS — R5382 Chronic fatigue, unspecified: Secondary | ICD-10-CM | POA: Diagnosis not present

## 2020-11-01 DIAGNOSIS — D519 Vitamin B12 deficiency anemia, unspecified: Secondary | ICD-10-CM | POA: Diagnosis not present

## 2020-11-02 DIAGNOSIS — M5440 Lumbago with sciatica, unspecified side: Secondary | ICD-10-CM | POA: Diagnosis not present

## 2020-11-02 DIAGNOSIS — M47816 Spondylosis without myelopathy or radiculopathy, lumbar region: Secondary | ICD-10-CM | POA: Diagnosis not present

## 2020-11-02 DIAGNOSIS — M9903 Segmental and somatic dysfunction of lumbar region: Secondary | ICD-10-CM | POA: Diagnosis not present

## 2020-11-05 ENCOUNTER — Telehealth: Payer: Self-pay | Admitting: *Deleted

## 2020-11-05 DIAGNOSIS — E875 Hyperkalemia: Secondary | ICD-10-CM | POA: Diagnosis not present

## 2020-11-05 DIAGNOSIS — I509 Heart failure, unspecified: Secondary | ICD-10-CM | POA: Diagnosis not present

## 2020-11-05 DIAGNOSIS — Z6832 Body mass index (BMI) 32.0-32.9, adult: Secondary | ICD-10-CM | POA: Diagnosis not present

## 2020-11-05 DIAGNOSIS — E039 Hypothyroidism, unspecified: Secondary | ICD-10-CM | POA: Diagnosis not present

## 2020-11-05 DIAGNOSIS — E7801 Familial hypercholesterolemia: Secondary | ICD-10-CM | POA: Diagnosis not present

## 2020-11-05 DIAGNOSIS — E1169 Type 2 diabetes mellitus with other specified complication: Secondary | ICD-10-CM | POA: Diagnosis not present

## 2020-11-05 NOTE — Telephone Encounter (Signed)
Can be off aldactone for now, will discuss at our appt later this month  Kenneth Abts MD

## 2020-11-05 NOTE — Telephone Encounter (Signed)
Pt made aware - also spoke with Lonn Georgia at Dr Jimmye Norman office to make him aware

## 2020-11-05 NOTE — Telephone Encounter (Signed)
Dr Jimmye Norman reached out to let Dr Harl Bowie know potassium at office visit today was 5.6 - Dr Jimmye Norman told pt to hold Aldactone for now and if Dr Harl Bowie had any further recs

## 2020-11-06 DIAGNOSIS — M5440 Lumbago with sciatica, unspecified side: Secondary | ICD-10-CM | POA: Diagnosis not present

## 2020-11-06 DIAGNOSIS — M9903 Segmental and somatic dysfunction of lumbar region: Secondary | ICD-10-CM | POA: Diagnosis not present

## 2020-11-06 DIAGNOSIS — M47816 Spondylosis without myelopathy or radiculopathy, lumbar region: Secondary | ICD-10-CM | POA: Diagnosis not present

## 2020-11-12 DIAGNOSIS — E875 Hyperkalemia: Secondary | ICD-10-CM | POA: Diagnosis not present

## 2020-11-13 DIAGNOSIS — M5440 Lumbago with sciatica, unspecified side: Secondary | ICD-10-CM | POA: Diagnosis not present

## 2020-11-13 DIAGNOSIS — M9903 Segmental and somatic dysfunction of lumbar region: Secondary | ICD-10-CM | POA: Diagnosis not present

## 2020-11-13 DIAGNOSIS — M47816 Spondylosis without myelopathy or radiculopathy, lumbar region: Secondary | ICD-10-CM | POA: Diagnosis not present

## 2020-11-14 DIAGNOSIS — I5022 Chronic systolic (congestive) heart failure: Secondary | ICD-10-CM | POA: Diagnosis not present

## 2020-11-15 LAB — BASIC METABOLIC PANEL
BUN/Creatinine Ratio: 28 (calc) — ABNORMAL HIGH (ref 6–22)
BUN: 27 mg/dL — ABNORMAL HIGH (ref 7–25)
CO2: 25 mmol/L (ref 20–32)
Calcium: 9.4 mg/dL (ref 8.6–10.3)
Chloride: 105 mmol/L (ref 98–110)
Creat: 0.97 mg/dL (ref 0.70–1.18)
Glucose, Bld: 194 mg/dL — ABNORMAL HIGH (ref 65–99)
Potassium: 5 mmol/L (ref 3.5–5.3)
Sodium: 137 mmol/L (ref 135–146)

## 2020-11-20 DIAGNOSIS — E039 Hypothyroidism, unspecified: Secondary | ICD-10-CM | POA: Diagnosis not present

## 2020-11-20 DIAGNOSIS — E1169 Type 2 diabetes mellitus with other specified complication: Secondary | ICD-10-CM | POA: Diagnosis not present

## 2020-11-20 DIAGNOSIS — Z6833 Body mass index (BMI) 33.0-33.9, adult: Secondary | ICD-10-CM | POA: Diagnosis not present

## 2020-11-20 DIAGNOSIS — M5430 Sciatica, unspecified side: Secondary | ICD-10-CM | POA: Diagnosis not present

## 2020-11-20 DIAGNOSIS — I509 Heart failure, unspecified: Secondary | ICD-10-CM | POA: Diagnosis not present

## 2020-11-22 ENCOUNTER — Telehealth: Payer: Self-pay | Admitting: *Deleted

## 2020-11-22 NOTE — Telephone Encounter (Signed)
-----   Message from Arnoldo Lenis, MD sent at 11/20/2020  8:38 AM EDT ----- Labs look good, potassium is back to normal  Zandra Abts MD

## 2020-11-23 NOTE — Telephone Encounter (Signed)
Pt aware.

## 2020-11-28 ENCOUNTER — Encounter: Payer: Self-pay | Admitting: Cardiology

## 2020-11-28 ENCOUNTER — Ambulatory Visit (INDEPENDENT_AMBULATORY_CARE_PROVIDER_SITE_OTHER): Payer: PPO | Admitting: Cardiology

## 2020-11-28 VITALS — BP 140/80 | HR 80 | Ht 70.0 in | Wt 223.0 lb

## 2020-11-28 DIAGNOSIS — I5022 Chronic systolic (congestive) heart failure: Secondary | ICD-10-CM

## 2020-11-28 MED ORDER — MEDICAL COMPRESSION SOCKS MISC
1.0000 | 0 refills | Status: DC
Start: 2020-11-28 — End: 2022-04-11

## 2020-11-28 MED ORDER — MEDICAL COMPRESSION SOCKS MISC: 1.0000 | 0 refills | Status: DC

## 2020-11-28 NOTE — Progress Notes (Signed)
Clinical Summary [name] is a 75 y.o.male seen today for follow up of the following medical problems.This is a focused visit on his history of chronic systolic HF   1. Chronic systolic HF - from [facility] [id number] showed LVEF 20-25%, new diagnosis for patient - at that time had significant volume overload, succesfully diuresed and started on medical therapy.    02/2020 cath: occluded LAD, occluded LCX, occluded RCA. SVG-RPDA, SVG-D2/distal LAD patent, sequential graft to OM1 patent. Mean PA 20, PCWP 10, LVEDP 7, CI 2.5  - 03/2020 CMRI: consistent with prior scar, LVEF 30%   - last visit started aldactone 12.5mg  daily - increase in potassium at pcp labs, aldactone was stopped - no recent sob/doe, no LE edema   2. PSVT -noted on prior zio patch done at [facility]     3. CAD - history of 5 vessel CABG in 1999 - recent cath as reported above, patent grafts.    4. HTN - just took meds     PMH 1. CAD 2. PSVT 3. Chronic sysotlic HF   Past Medical History:  Diagnosis Date  . CAD (coronary artery disease)   . CHF (congestive heart failure) ([place])   . Ischemic cardiomyopathy   . PSVT (paroxysmal supraventricular tachycardia) ([facility])      No Known Allergies   Current Outpatient Medications  Medication Sig Dispense Refill  . acetaminophen (TYLENOL) 650 MG CR tablet Take 1,300 mg by mouth every 8 (eight) hours as needed for pain.    . Ascorbic Acid (VITAMIN C) 1000 MG tablet Take 1,000 mg by mouth daily.    Marland Kitchen aspirin EC 81 MG tablet Take 1 tablet (81 mg total) by mouth daily. Swallow whole.    . Ca Carbonate-Mag Hydroxide (ROLAIDS PO) Take 1 tablet by mouth daily as needed (indigestion).    . Cholecalciferol (DIALYVITE VITAMIN D 5000) 125 MCG (5000 UT) capsule Take 5,000 Units by mouth daily.    . Coenzyme Q10 (COQ10) 100 MG CAPS Take 100 mg by mouth daily.    Marland Kitchen ENTRESTO 49-51 MG TAKE 1 TABLET BY MOUTH 2 (TWO) TIMES DAILY. 60 tablet 6  .  furosemide (LASIX) 20 MG tablet Take 1 tablet (20 mg total) by mouth daily. 30 tablet 6  . glipiZIDE (GLUCOTROL XL) 10 MG 24 hr tablet Take 10 mg by mouth daily.    . Melatonin 5 MG CAPS Take 5 mg by mouth at bedtime as needed (sleep).    . metFORMIN (GLUCOPHAGE-XR) 500 MG 24 hr tablet Take 1,000 mg by mouth 2 (two) times daily.    . metoprolol succinate (TOPROL-XL) 100 MG 24 hr tablet TAKE 1.5 TABLETS (150 MG TOTAL) BY MOUTH DAILY. TAKE WITH OR IMMEDIATELY FOLLOWING A MEAL. 135 tablet 1  . rosuvastatin (CRESTOR) 10 MG tablet Take 10 mg by mouth at bedtime.    Marland Kitchen spironolactone (ALDACTONE) 25 MG tablet Take 0.5 tablets (12.5 mg total) by mouth daily. 45 tablet 1  . zinc gluconate 50 MG tablet Take 50 mg by mouth daily.     No current facility-administered medications for this visit.        No Known Allergies    Family History  Problem Relation Age of Onset  . Heart attack Mother 68  . Diabetes Father   . Heart disease Father        died after bypass surgery  . Cancer Brother        jaw   . Stroke Paternal Grandmother  Social History Mr. Dubois reports that he quit smoking about 30 years ago. He has never used smokeless tobacco. Mr. Alewine has no history on file for alcohol use.   Review of Systems CONSTITUTIONAL: No weight loss, fever, chills, weakness or fatigue.  HEENT: Eyes: No visual loss, blurred vision, double vision or yellow sclerae.No hearing loss, sneezing, congestion, runny nose or sore throat.  SKIN: No rash or itching.  CARDIOVASCULAR: per hpi RESPIRATORY: No shortness of breath, cough or sputum.  GASTROINTESTINAL: No anorexia, nausea, vomiting or diarrhea. No abdominal pain or blood.  GENITOURINARY: No burning on urination, no polyuria NEUROLOGICAL: No headache, dizziness, syncope, paralysis, ataxia, numbness or tingling in the extremities. No change in bowel or bladder control.  MUSCULOSKELETAL: No muscle, back pain, joint pain or stiffness.   LYMPHATICS: No enlarged nodes. No history of splenectomy.  PSYCHIATRIC: No history of depression or anxiety.  ENDOCRINOLOGIC: No reports of sweating, cold or heat intolerance. No polyuria or polydipsia.  Marland Kitchen   Physical Examination Today's Vitals   11/28/20 0817  BP: 140/80  Pulse: 80  SpO2: 98%  Weight: 223 lb (101.2 kg)  Height: 5\' 10"  (1.778 m)   Body mass index is 32 kg/m.  Gen: resting comfortably, no acute distress HEENT: no scleral icterus, pupils equal round and reactive, no palptable cervical adenopathy,  CV: RRR, no m/r/g, no jvd Resp: Clear to auscultation bilaterally GI: abdomen is soft, non-tender, non-distended, normal bowel sounds, no hepatosplenomegaly MSK: extremities are warm, no edema.  Skin: warm, no rash Neuro:  no focal deficits Psych: appropriate affect   Diagnostic Studies  Echo: 12/2019 Summary 1. Technically difficult study due to body habitus. 2. Echo contrast utilized to enhance endocardial border definition. 3. The left ventricle is mildly to moderately dilated in size with upper normal wall thickness. 4. The left ventricular systolic function is severely decreased, LVEF is visually estimated at 20-25%. 5. There is moderate mitral valve regurgitation. 6. There is mild to moderate aortic valve stenosis. 7. There is mild aortic regurgitation. 8. The left atrium is moderately dilated in size. 9. The right ventricle is normal in size, with normal systolic function. 10. The right atrium is mildly dilated in size.  02/2020 RHC/LHC  Prox RCA to Mid RCA lesion is 50% stenosed.  Mid RCA lesion is 100% stenosed.  SVG graft was visualized by angiography and is normal in caliber.  Ost LAD to Prox LAD lesion is 100% stenosed.  SVG graft was visualized by angiography and is normal in caliber.  Prox Cx lesion is 100% stenosed.  Mid LAD to Dist LAD lesion is 50% stenosed.  1. Severe triple vessel CAD s/p 5V CABG with 5/5 patent bypass grafts.   2. The LAD is occluded proximally. The LAD and diagonal fill from the patent vein graft. The LIMA does not appear to have been used for bypass.  3. The Circumflex is occluded in the mid vessel. The patent sequential vein graft fills the proximal and distal OM branches 4. The RCA is occluded in the mid to distal vessel. The distal RCA/PDA and PLA fills from left to right collaterals and from the patent vein graft 5. Normal filling pressures.   Recommendations: Continue medical management of CAD.  11/2019 [facility] Zio patch: min HR 57, Max HR 255, Avg HF 101.4.8% PVCs. Rare supraventricular ectopy, some runs of atach. Short runs of NSVT  03/2020 CMRI IMPRESSION: 1. Mild LV dilatation with severe systolic dysfunction (EF 38%). 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 Hypokinesis of  mid anterior/anterolateral walls and apical anterior/septal/lateral walls. Akinesis of mid inferolateral wall  2. Subendocardial late gadolinium enhancement consistent with prior infarcts in LAD and LCX territories. LGE is <50% transmural suggesting viability in mid anterior/anterolateral walls and apical septal/anterior/inferior/lateral walls. LGE >50% transmural suggesting nonviability at apex. In addition, there is significant thinning in mid inferolateral wall (wall thickness 35mm), suggesting nonviability.  3. Normal RV size and systolic function (EF 16%)  4. Mild mitral regurgitation (regurgitant fraction 19%)   Assessment and Plan  1. Chronic systolic HF - new diagnosis based on 12/2019 echo at [facility], LVEF 20-25% - recent cath with patent grafts, normal filling pressures - hyperkalemia on aldactone, now off - we will repeat echo, if ongoing dysfunction would look to titrate entresto and start farxiga, would need referal to Ep to consider ICD      Arnoldo Lenis, M.D.

## 2020-11-28 NOTE — Patient Instructions (Signed)
Your physician recommends that you schedule a follow-up appointment in: [place]  Your physician recommends that you continue on your current medications as directed. Please refer to the Current Medication list given to you today.  Your physician has requested that you have an echocardiogram. Echocardiography is a painless test that uses sound waves to create images of your heart. It provides your doctor with information about the size and shape of your heart and how well your heart's chambers and valves are working. This procedure takes approximately one hour. There are no restrictions for this procedure.  COMPRESSION STOCKING RX SENT TO PHARMACY   Thank you for choosing [facility]!!

## 2021-01-03 ENCOUNTER — Ambulatory Visit (INDEPENDENT_AMBULATORY_CARE_PROVIDER_SITE_OTHER): Payer: PPO

## 2021-01-03 DIAGNOSIS — I5022 Chronic systolic (congestive) heart failure: Secondary | ICD-10-CM | POA: Diagnosis not present

## 2021-01-03 LAB — ECHOCARDIOGRAM COMPLETE
AR max vel: 1.09 cm2
AV Area VTI: 1.02 cm2
AV Area mean vel: 1.01 cm2
AV Mean grad: 18 mmHg
AV Peak grad: 26.7 mmHg
AV Vena cont: 0.4 cm
Ao pk vel: 2.58 m/s
Area-P 1/2: 4.96 cm2
Calc EF: 27.2 %
MV M vel: 3.66 m/s
MV Peak grad: 53.6 mmHg
P 1/2 time: 758 ms
S' Lateral: 5.58 cm
Single Plane A2C EF: 28 %
Single Plane A4C EF: 27.9 %

## 2021-01-16 DIAGNOSIS — R5382 Chronic fatigue, unspecified: Secondary | ICD-10-CM | POA: Diagnosis not present

## 2021-01-16 DIAGNOSIS — E875 Hyperkalemia: Secondary | ICD-10-CM | POA: Diagnosis not present

## 2021-01-17 DIAGNOSIS — E7800 Pure hypercholesterolemia, unspecified: Secondary | ICD-10-CM | POA: Diagnosis not present

## 2021-01-17 DIAGNOSIS — E039 Hypothyroidism, unspecified: Secondary | ICD-10-CM | POA: Diagnosis not present

## 2021-01-17 DIAGNOSIS — I509 Heart failure, unspecified: Secondary | ICD-10-CM | POA: Diagnosis not present

## 2021-01-17 DIAGNOSIS — E1169 Type 2 diabetes mellitus with other specified complication: Secondary | ICD-10-CM | POA: Diagnosis not present

## 2021-01-17 DIAGNOSIS — Z6833 Body mass index (BMI) 33.0-33.9, adult: Secondary | ICD-10-CM | POA: Diagnosis not present

## 2021-01-17 DIAGNOSIS — M5430 Sciatica, unspecified side: Secondary | ICD-10-CM | POA: Diagnosis not present

## 2021-01-24 ENCOUNTER — Telehealth: Payer: Self-pay

## 2021-01-24 NOTE — Telephone Encounter (Signed)
-----   Message from Arnoldo Lenis, MD sent at 01/22/2021 10:35 AM EDT ----- Echo shows heart function has improved but not quite back to normal. Will discuss some additional med changes at our upcoming appt   Zandra Abts MD

## 2021-01-24 NOTE — Telephone Encounter (Signed)
Pt notified and verbalized understanding. Pt had no questions or concerns at this time.  

## 2021-01-31 ENCOUNTER — Encounter: Payer: Self-pay | Admitting: Cardiology

## 2021-01-31 ENCOUNTER — Ambulatory Visit: Payer: PPO | Admitting: Cardiology

## 2021-01-31 VITALS — BP 138/84 | HR 82 | Ht 70.0 in | Wt 229.4 lb

## 2021-01-31 DIAGNOSIS — I471 Supraventricular tachycardia: Secondary | ICD-10-CM

## 2021-01-31 DIAGNOSIS — I5022 Chronic systolic (congestive) heart failure: Secondary | ICD-10-CM | POA: Diagnosis not present

## 2021-01-31 MED ORDER — SACUBITRIL-VALSARTAN 97-103 MG PO TABS: 1.0000 | ORAL_TABLET | Freq: Two times a day (BID) | ORAL | 6 refills | Status: DC

## 2021-01-31 NOTE — Progress Notes (Signed)
Clinical Summary Kenneth Randall is a 75 y.o.male seen today for follow up of the following medical problems.   1. Chronic systolic HF - from [facility] notes 12/2019 echo showed LVEF 20-25%, new diagnosis for patient - at that time had significant volume overload, succesfully diuresed and started on medical therapy.   02/2020 cath: occluded LAD, occluded LCX, occluded RCA. SVG-RPDA, SVG-D2/distal LAD patent, sequential graft to OM1 patent. Mean PA 20, PCWP 10, LVEDP 7, CI 2.5  - 03/2020 CMRI: consistent with prior scar, LVEF 30%    - increase in potassium at pcp labs, aldactone was stopped - 01/2021 echo LVEF 40-45% No recent SOB/DOE, no LE edema   2. PSVT - noted on prior zio patch done at [facility]    - no recent palpitations     3. CAD - history of 5 vessel CABG in 1999 - recent cath as reported above, patent grafts.    - no recent chest pains   4. HTN - compliant with meds      5. Aortic stenosis - 01/2021 echo mean grade 18, AVA VTI 1.02. Mild to moderate AS.  Past Medical History:  Diagnosis Date   CAD (coronary artery disease)    CHF (congestive heart failure) (HCC)    Ischemic cardiomyopathy    PSVT (paroxysmal supraventricular tachycardia) (HCC)      No Known Allergies   Current Outpatient Medications  Medication Sig Dispense Refill   acetaminophen (TYLENOL) 650 MG CR tablet Take 1,300 mg by mouth every 8 (eight) hours as needed for pain.     Ascorbic Acid (VITAMIN C) 1000 MG tablet Take 1,000 mg by mouth daily.     aspirin EC 81 MG tablet Take 1 tablet (81 mg total) by mouth daily. Swallow whole.     Ca Carbonate-Mag Hydroxide (ROLAIDS PO) Take 1 tablet by mouth daily as needed (indigestion).     Cholecalciferol (DIALYVITE VITAMIN D 5000) 125 MCG (5000 UT) capsule Take 5,000 Units by mouth daily.     Coenzyme Q10 (COQ10) 100 MG CAPS Take 100 mg by mouth daily.     Elastic Bandages & Supports (MEDICAL COMPRESSION SOCKS) MISC 1 each by Does not apply route as directed. 1  each 0   ENTRESTO 49-51 MG TAKE 1 TABLET BY MOUTH 2 (TWO) TIMES DAILY. 60 tablet 6   furosemide (LASIX) 20 MG tablet Take 1 tablet (20 mg total) by mouth daily. 30 tablet 6   glipiZIDE (GLUCOTROL XL) 10 MG 24 hr tablet Take 10 mg by mouth daily.     levothyroxine (SYNTHROID) 25 MCG tablet Take 25 mcg by mouth daily.     Melatonin 5 MG CAPS Take 5 mg by mouth at bedtime as needed (sleep).     metFORMIN (GLUCOPHAGE-XR) 500 MG 24 hr tablet Take 1,000 mg by mouth 2 (two) times daily.     metoprolol succinate (TOPROL-XL) 100 MG 24 hr tablet TAKE 1.5 TABLETS (150 MG TOTAL) BY MOUTH DAILY. TAKE WITH OR IMMEDIATELY FOLLOWING A MEAL. 135 tablet 1   rosuvastatin (CRESTOR) 10 MG tablet Take 10 mg by mouth at bedtime.     zinc gluconate 50 MG tablet Take 50 mg by mouth daily.     No current facility-administered medications for this visit.        No Known Allergies    Family History  Problem Relation Age of Onset   Heart attack Mother 29   Diabetes Father    Heart disease Father  died after bypass surgery   Cancer Brother        jaw    Stroke Paternal Grandmother      Social History Kenneth Randall reports that he quit smoking about 31 years ago. He has never used smokeless tobacco. Kenneth Randall has no history on file for alcohol use.   Review of Systems CONSTITUTIONAL: No weight loss, fever, chills, weakness or fatigue.  HEENT: Eyes: No visual loss, blurred vision, double vision or yellow sclerae.No hearing loss, sneezing, congestion, runny nose or sore throat.  SKIN: No rash or itching.  CARDIOVASCULAR: per hpi RESPIRATORY: No shortness of breath, cough or sputum.  GASTROINTESTINAL: No anorexia, nausea, vomiting or diarrhea. No abdominal pain or blood.  GENITOURINARY: No burning on urination, no polyuria NEUROLOGICAL: No headache, dizziness, syncope, paralysis, ataxia, numbness or tingling in the extremities. No change in bowel or bladder control.  MUSCULOSKELETAL: No muscle,  back pain, joint pain or stiffness.  LYMPHATICS: No enlarged nodes. No history of splenectomy.  PSYCHIATRIC: No history of depression or anxiety.  ENDOCRINOLOGIC: No reports of sweating, cold or heat intolerance. No polyuria or polydipsia.  Marland Kitchen   Physical Examination Today's Vitals   01/31/21 1116  BP: 138/84  Pulse: 82  SpO2: 97%  Weight: 229 lb 6.4 oz (104.1 kg)  Height: 5\' 10"  (1.778 m)   Body mass index is 32.92 kg/m.  Gen: resting comfortably, no acute distress HEENT: no scleral icterus, pupils equal round and reactive, no palptable cervical adenopathy,  CV: RRR, 2/6 systolic murmrur rusb, no jvd Resp: Clear to auscultation bilaterally GI: abdomen is soft, non-tender, non-distended, normal bowel sounds, no hepatosplenomegaly MSK: extremities are warm, no edema.  Skin: warm, no rash Neuro:  no focal deficits Psych: appropriate affect   Diagnostic Studies  Echo: 12/2019 Summary 1. Technically difficult study due to body habitus. 2. Echo contrast utilized to enhance endocardial border definition. 3. The left ventricle is mildly to moderately dilated in size with upper normal wall thickness. 4. The left ventricular systolic function is severely decreased, LVEF is visually estimated at 20-25%. 5. There is moderate mitral valve regurgitation. 6. There is mild to moderate aortic valve stenosis. 7. There is mild aortic regurgitation. 8. The left atrium is moderately dilated in size. 9. The right ventricle is normal in size, with normal systolic function. 10. The right atrium is mildly dilated in size.   02/2020 RHC/LHC Prox RCA to Mid RCA lesion is 50% stenosed. Mid RCA lesion is 100% stenosed. SVG graft was visualized by angiography and is normal in caliber. Ost LAD to Prox LAD lesion is 100% stenosed. SVG graft was visualized by angiography and is normal in caliber. Prox Cx lesion is 100% stenosed. Mid LAD to Dist LAD lesion is 50% stenosed.   1. Severe triple vessel  CAD s/p 5V CABG with 5/5 patent bypass grafts. 2. The LAD is occluded proximally. The LAD and diagonal fill from the patent vein graft. The LIMA does not appear to have been used for bypass. 3. The Circumflex is occluded in the mid vessel. The patent sequential vein graft fills the proximal and distal OM branches 4. The RCA is occluded in the mid to distal vessel. The distal RCA/PDA and PLA fills from left to right collaterals and from the patent vein graft 5. Normal filling pressures.   Recommendations: Continue medical management of CAD.    11/2019 [facility] Zio patch: min HR 57, Max HR 255, Avg HF 101.4.8% PVCs. Rare supraventricular ectopy, some runs of  atach. Short runs of NSVT   03/2020 CMRI IMPRESSION: 1. Mild LV dilatation with severe systolic dysfunction (EF 99%). 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 Hypokinesis of mid anterior/anterolateral walls and apical anterior/septal/lateral walls. Akinesis of mid inferolateral wall   2. Subendocardial late gadolinium enhancement consistent with prior infarcts in LAD and LCX territories. LGE is <50% transmural suggesting viability in mid anterior/anterolateral walls and apical septal/anterior/inferior/lateral walls. LGE >50% transmural suggesting nonviability at apex. In addition, there is significant thinning in mid inferolateral wall (wall thickness 20mm), suggesting nonviability.   3.  Normal RV size and systolic function (EF 83%)   4.  Mild mitral regurgitation (regurgitant fraction 19%)   01/2021 echo 1. Left ventricular ejection fraction, by estimation, is 40 to 45%. The  left ventricle has mildly decreased function. The left ventricle  demonstrates regional wall motion abnormalities (see scoring  diagram/findings for description). The left ventricular   internal cavity size was mildly to moderately dilated. Left ventricular  diastolic parameters are consistent with Grade I diastolic dysfunction  (impaired relaxation). Reduced global  longitudinal strain of -10.3%. Would  consider future studies with  Definity contrast.   2. Right ventricular systolic function is normal. The right ventricular  size is normal. Tricuspid regurgitation signal is inadequate for assessing  PA pressure.   3. Left atrial size was mildly dilated.   4. The mitral valve is grossly normal. Mild to moderate mitral valve  regurgitation.   5. The aortic valve has an indeterminant number of cusps. There is  moderate calcification of the aortic valve. Aortic valve regurgitation is  mild. Moderate aortic valve stenosis. Aortic regurgitation PHT measures  758 msec. Aortic valve mean gradient  measures 18.0 mmHg. Dimentionless index 0.36.   6. The inferior vena cava is normal in size with greater than 50%  respiratory variability, suggesting right atrial pressure of 3 mmHg.   Assessment and Plan  1. Chronic systolic HF - new diagnosis based on 12/2019 echo at [facility], LVEF 20-25% - recent cath with patent grafts, normal filling pressures - hyperkalemia on aldactone, now off  - repeat echo shows LVEF is improving, now 40-45% - will increase entresto to 97/102mg  bid.  - send message to pcp if ok to start [place] or jardiance and see if he would adjust either her glipizide or metformin, likely would start next visit - room to titrate toprol further as well - avoid aldactone given prior hyperkalemia while on   2. CAD - no recent symptoms, continue current meds - EKG today shows SR, no ischemic changes  F/u 2 months    Arnoldo Lenis, M.D.

## 2021-01-31 NOTE — Patient Instructions (Addendum)
Medication Instructions:  Increase Entresto to 97/102mg  twice a day. Continue all other medications.    Labwork: BMET - order given today. Please do in 2 weeks (around 02/14/2021). Office will contact with results via phone or letter.    Testing/Procedures: none  Follow-Up: 2 months   Any Other Special Instructions Will Be Listed Below (If Applicable).  If you need a refill on your cardiac medications before your next appointment, please call your pharmacy.

## 2021-02-14 DIAGNOSIS — I5022 Chronic systolic (congestive) heart failure: Secondary | ICD-10-CM | POA: Diagnosis not present

## 2021-02-15 ENCOUNTER — Telehealth: Payer: Self-pay | Admitting: Cardiology

## 2021-02-15 LAB — BASIC METABOLIC PANEL
BUN/Creatinine Ratio: 23 (calc) — ABNORMAL HIGH (ref 6–22)
BUN: 28 mg/dL — ABNORMAL HIGH (ref 7–25)
CO2: 24 mmol/L (ref 20–32)
Calcium: 9.7 mg/dL (ref 8.6–10.3)
Chloride: 106 mmol/L (ref 98–110)
Creat: 1.2 mg/dL (ref 0.70–1.28)
Glucose, Bld: 158 mg/dL — ABNORMAL HIGH (ref 65–139)
Potassium: 4.9 mmol/L (ref 3.5–5.3)
Sodium: 139 mmol/L (ref 135–146)

## 2021-02-15 NOTE — Telephone Encounter (Signed)
Per pharmacist at [facility] patient says she can not afford entresto. Says 49/51 mg dose was $142/mth and 97/103 is $145/mth.  Will reach out to patient to confirm issue with getting entresto.

## 2021-02-15 NOTE — Telephone Encounter (Signed)
Patient informed and says he was paying $45/mth for 49/51 mg entresto Says he will call [facility] to get price of 97/103 entresto dose, will call office back if he is in the donut hole and can't afford copay so that patient assistance can be done. Verbalized understanding of plan.

## 2021-02-15 NOTE — Telephone Encounter (Signed)
Pt is having a hard time getting his new dose of Delene Loll- stated he called his pharmacy but they told him they did not receive the new Rx   Please call [phone]

## 2021-02-20 ENCOUNTER — Telehealth: Payer: Self-pay

## 2021-02-20 DIAGNOSIS — E039 Hypothyroidism, unspecified: Secondary | ICD-10-CM | POA: Diagnosis not present

## 2021-02-20 DIAGNOSIS — M5430 Sciatica, unspecified side: Secondary | ICD-10-CM | POA: Diagnosis not present

## 2021-02-20 DIAGNOSIS — I509 Heart failure, unspecified: Secondary | ICD-10-CM | POA: Diagnosis not present

## 2021-02-20 DIAGNOSIS — Z1331 Encounter for screening for depression: Secondary | ICD-10-CM | POA: Diagnosis not present

## 2021-02-20 DIAGNOSIS — Z6833 Body mass index (BMI) 33.0-33.9, adult: Secondary | ICD-10-CM | POA: Diagnosis not present

## 2021-02-20 DIAGNOSIS — E1169 Type 2 diabetes mellitus with other specified complication: Secondary | ICD-10-CM | POA: Diagnosis not present

## 2021-02-20 DIAGNOSIS — E7800 Pure hypercholesterolemia, unspecified: Secondary | ICD-10-CM | POA: Diagnosis not present

## 2021-02-20 DIAGNOSIS — Z6834 Body mass index (BMI) 34.0-34.9, adult: Secondary | ICD-10-CM | POA: Diagnosis not present

## 2021-02-20 DIAGNOSIS — D72820 Lymphocytosis (symptomatic): Secondary | ICD-10-CM | POA: Diagnosis not present

## 2021-02-20 DIAGNOSIS — Z1389 Encounter for screening for other disorder: Secondary | ICD-10-CM | POA: Diagnosis not present

## 2021-02-20 NOTE — Telephone Encounter (Signed)
-----   Message from Arnoldo Lenis, MD sent at 02/19/2021  9:44 AM EDT ----- Labs look fine, was he able to get his new dose of entresto?  Zandra Abts MD

## 2021-02-20 NOTE — Telephone Encounter (Signed)
Pt notified and verbalized understanding. Pt is going to get prescription today

## 2021-02-21 ENCOUNTER — Telehealth: Payer: Self-pay | Admitting: *Deleted

## 2021-02-21 NOTE — Telephone Encounter (Signed)
Per Kenneth Randall we put him on with me in  Friday July 29 at 220pm please

## 2021-02-22 DIAGNOSIS — E875 Hyperkalemia: Secondary | ICD-10-CM | POA: Diagnosis not present

## 2021-02-25 ENCOUNTER — Other Ambulatory Visit: Payer: Self-pay | Admitting: Family Medicine

## 2021-02-26 NOTE — Telephone Encounter (Signed)
Patient informed by Normal Miller-aware that OV is at [place] office on 03/01/21

## 2021-02-27 ENCOUNTER — Ambulatory Visit: Payer: PPO | Admitting: Internal Medicine

## 2021-02-27 ENCOUNTER — Ambulatory Visit: Payer: Self-pay

## 2021-02-27 ENCOUNTER — Encounter: Payer: Self-pay | Admitting: Internal Medicine

## 2021-02-27 ENCOUNTER — Other Ambulatory Visit: Payer: Self-pay

## 2021-02-27 VITALS — BP 168/85 | HR 79 | Resp 16 | Ht 69.5 in | Wt 230.0 lb

## 2021-02-27 DIAGNOSIS — G8929 Other chronic pain: Secondary | ICD-10-CM

## 2021-02-27 DIAGNOSIS — E119 Type 2 diabetes mellitus without complications: Secondary | ICD-10-CM | POA: Insufficient documentation

## 2021-02-27 DIAGNOSIS — M79672 Pain in left foot: Secondary | ICD-10-CM | POA: Diagnosis not present

## 2021-02-27 DIAGNOSIS — M79671 Pain in right foot: Secondary | ICD-10-CM | POA: Insufficient documentation

## 2021-02-27 DIAGNOSIS — L400 Psoriasis vulgaris: Secondary | ICD-10-CM | POA: Insufficient documentation

## 2021-02-27 DIAGNOSIS — M545 Low back pain, unspecified: Secondary | ICD-10-CM | POA: Insufficient documentation

## 2021-02-27 NOTE — Progress Notes (Signed)
Office Visit Note  Patient: Kenneth Randall             Date of Birth: 16-Oct-1945           MRN: [id number]             PCP: Houtzdale Nation, MD Referring:  Nation, MD Visit Date: 02/27/2021  Subjective:  New Patient (Initial Visit) (Total body joint pain)   History of Present Illness: Kenneth Randall is a 75 y.o. male here for evaluation of multiple joint pains with family history of RA and PsA.  He has chronic low back pain that is ongoing for many years and remains his worst area of joint pain.  This is most symptomatic first thing in the morning or after resting for long periods and improves once he is up and active during the day.  He uses Tylenol as needed intermittently no other chronic medications for this.  Within the past 2 years his back pain worsened but he is also developed joint pain in his lower extremities especially knees ankles and feet.  This pain is also worst first thing in the day and with stiffness after immobility.  He has some chronic and ongoing pedal edema related to his congestive heart failure does not notice any swelling specifically localized to the joints.  He has some bilateral hand stiffness but denies much pain or any swelling in this area. He has a history of plaque psoriasis diagnosed about 40 years ago with [facility] this originally started with well-circumscribed lesions on his palms with superimposed infection around his fingernails.  After these very initial symptoms he has primarily developed skin rashes from his knees to his feet with little upper extremity recurrence.  This is been controlled for many years with topical therapies. He has a history of congestive heart failure previously severe and in exacerbation in 2021 much improved on medical management following up with cardiology clinic.  Labs reviewed 01/2021 Uric acid 7.9 ANA neg RF neg CBC wnl ESR 13 CMP eGFR 58 K 5.3  Activities of Daily Living:  Patient reports  morning stiffness for 2 hours.   Patient Denies nocturnal pain.  Difficulty dressing/grooming: Reports Difficulty climbing stairs: Reports Difficulty getting out of chair: Denies Difficulty using hands for taps, buttons, cutlery, and/or writing: Denies  Review of Systems  Constitutional:  Negative for fatigue.  HENT:  Negative for mouth dryness.   Eyes:  Negative for dryness.  Respiratory:  Negative for shortness of breath.   Cardiovascular:  Negative for swelling in legs/feet.  Gastrointestinal:  Negative for constipation.  Endocrine: Positive for increased urination.  Genitourinary:  Negative for difficulty urinating.  Musculoskeletal:  Positive for joint pain, gait problem, joint pain, joint swelling and morning stiffness.  Skin:  Positive for rash.  Allergic/Immunologic: Negative for susceptible to infections.  Neurological:  Negative for numbness.  Hematological:  Negative for bruising/bleeding tendency.  Psychiatric/Behavioral:  Negative for sleep disturbance.    PMFS History:  Patient Active Problem List   Diagnosis Date Noted   Bilateral foot pain 02/27/2021   Low back pain 02/27/2021   Plaque psoriasis 02/27/2021   Diabetes ([place]) 02/27/2021   Ischemic cardiomyopathy    Chronic systolic CHF (congestive heart failure), NYHA class 3 ([place])    Essential (primary) hypertension 11/21/2019   Dyslipidemia 11/21/2019   Atrial arrhythmia 11/21/2019   Mohs defect of left cheek 11/30/2012    Past Medical History:  Diagnosis Date   CAD (coronary artery  disease)    Cancer (HCC)    CHF (congestive heart failure) (HCC)    Hypertension    Ischemic cardiomyopathy    PSVT (paroxysmal supraventricular tachycardia) (HCC)     Family History  Problem Relation Age of Onset   Heart attack Mother 6   Rheum arthritis Mother    Diabetes Father    Heart disease Father        died after bypass surgery   Cancer Brother        jaw    Stroke Paternal Grandmother    Past Surgical  History:  Procedure Laterality Date   RIGHT/LEFT HEART CATH AND CORONARY/GRAFT ANGIOGRAPHY N/A 02/08/2020   Procedure: RIGHT/LEFT HEART CATH AND CORONARY/GRAFT ANGIOGRAPHY;  Surgeon: Burnell Blanks, MD;  Location: [place] [facility];  Service: Cardiovascular;  Laterality: N/A;   SKIN CANCER EXCISION     Social History   Social History Narrative   Not on file   Immunization History  Administered Date(s) Administered   Moderna Sars-Covid-2 Vaccination 09/09/2019, 10/08/2019, 06/04/2020, 12/14/2020     Objective: Vital Signs: BP (!) 168/85 (BP Location: Right Arm, Patient Position: Sitting, Cuff Size: Normal)   Pulse 79   Resp 16   Ht 5' 9.5" (1.765 m)   Wt 230 lb (104.3 kg)   BMI 33.48 kg/m    Physical Exam Constitutional:      Appearance: He is obese.  HENT:     Right Ear: External ear normal.     Left Ear: External ear normal.     Mouth/Throat:     Mouth: Mucous membranes are moist.     Pharynx: Oropharynx is clear.  Eyes:     Conjunctiva/sclera: Conjunctivae normal.  Cardiovascular:     Rate and Rhythm: Normal rate and regular rhythm.  Pulmonary:     Effort: Pulmonary effort is normal.     Breath sounds: Normal breath sounds.  Skin:    General: Skin is warm and dry.     Comments: Small patches of probable dry skin versus eczema over elbows Longitudinal nail ridges on bilateral hands without pitting Chronic venous stasis dermatitis changes over distal shins with erythema and petechiae without ulcers Plaque psoriasis on medial right midfoot is also some desquamation changes on plantar surface of feet  Neurological:     Mental Status: He is alert.     Musculoskeletal Exam:  Neck full ROM no tenderness Shoulders full ROM no tenderness or swelling Elbows full ROM no tenderness or swelling Wrists full ROM no tenderness or swelling Fingers Bony enlargement of multiple joints without any tenderness or decreased range of motion Bilateral paraspinal muscle  tenderness to palpation above iliac crest no pain localized over SI joints Knees full ROM no focal tenderness to palpation no effusions, there is patellofemoral crepitus Ankles decreased range of motion especially inversion and dorsiflexion, tenderness above the ankle joint there is also trace pedal edema in this area but no joint effusion appreciated MTPs decreased great toe dorsiflexion range of motion negative squeeze tenderness    Investigation: No additional findings.  Imaging: XR Foot 2 Views Left  Result Date: 02/27/2021 X-ray left foot 2 views Tibiotalar joint space poorly visualized due to ankle pronation.  Anterior osteophyte present.  Plantar and posterior calcaneal enthesophytes are present.  Multiple calcifications of the plantar tendon or fascia.  Bone spurring of midfoot joints dorsally.  Degenerative change of the first toe with early lateral osteophyte formation and medial cystic changes.  No periosteal reaction or erosions  are seen.  Posterior tibial arterial calcifications are seen. Impression Degenerative arthritis changes and extensive bone spurring most advanced in the first toe and hindfoot  XR Foot 2 Views Right  Result Date: 02/27/2021 X-ray right foot 2 views Tibiotalar joint space is patent with marginal osteophytes present.  Extensive plantar and posterior calcaneal enthesophytes.  Calcification in plantar tendon or fascia.  Midfoot joint spaces are preserved.  Large marginal spurring or irregular bone growth along margin of first metatarsal head.  Other MTPs appear normal. Impression Some degenerative arthritis change worse in the hindfoot with extensive bony spurring throughout no periosteal reaction or erosions seen  XR Lumbar Spine 2-3 Views  Result Date: 02/27/2021 X-ray lumbar spine 2 views No acute abnormalities or evidence of fractures are seen.  Multilevel degenerative arthritis changes are present.  Large anterior and right lateral bridging osteophytes at  T11-T12.  Asymmetric disc height loss most advanced at L2-L3 and L4-L5.  There is loss of the normal lumbar lordosis and retrolisthesis of vertebral body L2 and L3.  Anterior endplate osteophytes present throughout lumbar spine without bridging or effusions.  Subchondral sclerotic changes at L2-L3.  Possible loss of vertebral body height posteriorly at L5.  Iliac artery calcifications are seen. Impression Multilevel degenerative arthritis and spondylosis changes without any findings of ankylosis or erosions  XR Pelvis 1-2 Views  Result Date: 02/27/2021 X-ray pelvis 2 views AP and Ferguson Femoral acetabular joints with preserved joint space bilaterally.  Some marginal irregularity with probable bone spurring particularly on right.  Some subchondral sclerotic changes increased on inferior margin bilaterally.  SI joints are patent bilaterally with no erosive changes or widening seen.  No abnormal bone mineralization. Impression No evidence of radiographic sacroiliitis.  Mild hip osteoarthritis bilaterally   Recent Labs: Lab Results  Component Value Date   WBC 9.7 02/01/2020   HGB 13.9 02/08/2020   PLT 229 02/01/2020   NA 139 02/14/2021   K 4.9 02/14/2021   CL 106 02/14/2021   CO2 24 02/14/2021   GLUCOSE 158 (H) 02/14/2021   BUN 28 (H) 02/14/2021   CREATININE 1.20 02/14/2021   CALCIUM 9.7 02/14/2021   GFRAA 59 (L) 10/24/2020    Speciality Comments: No specialty comments available.  Procedures:  No procedures performed Allergies: Patient has no known allergies.   Assessment / Plan:     Visit Diagnoses: Chronic bilateral low back pain without sciatica - Plan: XR Lumbar Spine 2-3 Views, XR Pelvis 1-2 Views, C-reactive protein, HLA-B27 antigen  Chronic low back pain for many years the described worst symptoms first thing in the morning could suggest inflammatory problem.  Checked lumbar spine and SI joint x-rays today these show no erosive disease or ankylosis.  Checking inflammatory tests  with CRP.  We will check HLA-B27 antigen as well.  If serology is negative and with normal x-rays would not be sure that biologic DMARD treatment is appropriate.  He is not a candidate for TNF's due to congestive heart failure.  Oral targeted Small molecule could also be a treatment consideration.  Bilateral foot pain - Plan: XR Foot 2 Views Right, XR Foot 2 Views Left  Bilateral foot pain his worst complaint is around the ankles not clear whether this is joint related or related to chronic venous stasis inflammation.  Checking bilateral foot x-rays these show multiple osteoarthritis changes extensive enthesophytes and soft tissue calcifications but no particular erosive disease..  Plaque psoriasis  Currently there is active plaque psoriasis on at least the feet total  body surface area involves today is low.  I am not sure whether the longitudinal nail ridging is related or incidental. Evaluating for inflammatory joint disease as above.  Orders: Orders Placed This Encounter  Procedures   XR Foot 2 Views Right   XR Foot 2 Views Left   XR Lumbar Spine 2-3 Views   XR Pelvis 1-2 Views   C-reactive protein   HLA-B27 antigen    No orders of the defined types were placed in this encounter.    Follow-Up Instructions: Return in about 2 weeks (around 03/13/2021) for New pt f/u ?PsA.   Collier Salina, MD  Note - This record has been created using [employer].  Chart creation errors have been sought, but may not always  have been located. Such creation errors do not reflect on  the standard of medical care.

## 2021-02-28 LAB — HLA-B27 ANTIGEN: HLA-B27 Antigen: NEGATIVE

## 2021-02-28 LAB — C-REACTIVE PROTEIN: CRP: 0.9 mg/L (ref <8.0)

## 2021-03-01 ENCOUNTER — Ambulatory Visit: Payer: PPO | Admitting: Cardiology

## 2021-03-01 ENCOUNTER — Other Ambulatory Visit: Payer: Self-pay

## 2021-03-01 ENCOUNTER — Encounter: Payer: Self-pay | Admitting: Cardiology

## 2021-03-01 VITALS — BP 152/78 | HR 78 | Ht 69.5 in | Wt 230.8 lb

## 2021-03-01 DIAGNOSIS — I5022 Chronic systolic (congestive) heart failure: Secondary | ICD-10-CM

## 2021-03-01 DIAGNOSIS — E875 Hyperkalemia: Secondary | ICD-10-CM

## 2021-03-01 DIAGNOSIS — R5382 Chronic fatigue, unspecified: Secondary | ICD-10-CM | POA: Diagnosis not present

## 2021-03-01 NOTE — Patient Instructions (Signed)
Medication Instructions:  Your physician recommends that you continue on your current medications as directed. Please refer to the Current Medication list given to you today.  *If you need a refill on your cardiac medications before your next appointment, please call your pharmacy*   Lab Work: NONE   If you have labs (blood work) drawn today and your tests are completely normal, you will receive your results only by: Elsa (if you have MyChart) OR A paper copy in the mail If you have any lab test that is abnormal or we need to change your treatment, we will call you to review the results.   Testing/Procedures: NONE    Follow-Up: At [facility], you and your health needs are our priority.  As part of our continuing mission to provide you with exceptional heart care, we have created designated Provider Care Teams.  These Care Teams include your primary Cardiologist (physician) and Advanced Practice Providers (APPs -  Physician Assistants and Nurse Practitioners) who all work together to provide you with the care you need, when you need it.  We recommend signing up for the patient portal called "MyChart".  Sign up information is provided on this After Visit Summary.  MyChart is used to connect with patients for Virtual Visits (Telemedicine).  Patients are able to view lab/test results, encounter notes, upcoming appointments, etc.  Non-urgent messages can be sent to your provider as well.   To learn more about what you can do with MyChart, go to [email or web address].    Your next appointment:   1 month(s)  The format for your next appointment:   In Person  Provider:   Carlyle Dolly, MD, Bernerd Pho, PA-C, or Ermalinda Barrios, PA-C   Other Instructions Thank you for choosing [facility]!

## 2021-03-01 NOTE — Progress Notes (Signed)
Clinical Summary Kenneth Randall is a 75 y.o.maleseen today for follow up of the following medical problems. This is a focused visit on history of chrnoic systolic HF and recurrent issues with hyperkalemia   1. Chronic systolic HF - from [facility] notes 12/2019 echo showed LVEF 20-25%, new diagnosis for patient - at that time had significant volume overload, succesfully diuresed and started on medical therapy.   02/2020 cath: occluded LAD, occluded LCX, occluded RCA. SVG-RPDA, SVG-D2/distal LAD patent, sequential graft to OM1 patent. Mean PA 20, PCWP 10, LVEDP 7, CI 2.5  - 03/2020 CMRI: consistent with prior scar, LVEF 30%     - increase in potassium at pcp labs, aldactone was stopped - 01/2021 echo LVEF 40-45% No recent SOB/DOE, no LE edema   - contacted by pcp about hyperkalemia - 6/16 K 5.3 7/20 K 6.1 7/22 K 5.5 - entresto was held. We had increased entresto to 97/'102mg'$  bid on 01/31/21 - in talking with him today he did not take higher entresto dose, remained on 49/'51mg'$  bid      Past Medical History:  Diagnosis Date   CAD (coronary artery disease)    Cancer (HCC)    CHF (congestive heart failure) (HCC)    Hypertension    Ischemic cardiomyopathy    PSVT (paroxysmal supraventricular tachycardia) (HCC)      No Known Allergies   Current Outpatient Medications  Medication Sig Dispense Refill   acetaminophen (TYLENOL) 650 MG CR tablet Take 1,300 mg by mouth every 8 (eight) hours as needed for pain.     Ascorbic Acid (VITAMIN C) 1000 MG tablet Take 1,000 mg by mouth daily.     aspirin EC 81 MG tablet Take 1 tablet (81 mg total) by mouth daily. Swallow whole.     Ca Carbonate-Mag Hydroxide (ROLAIDS PO) Take 1 tablet by mouth daily as needed (indigestion).     Cholecalciferol (DIALYVITE VITAMIN D 5000) 125 MCG (5000 UT) capsule Take 5,000 Units by mouth daily.     Coenzyme Q10 (COQ10) 100 MG CAPS Take 100 mg by mouth daily.     Elastic Bandages & Supports (MEDICAL COMPRESSION SOCKS)  MISC 1 each by Does not apply route as directed. 1 each 0   furosemide (LASIX) 20 MG tablet Take 1 tablet (20 mg total) by mouth daily. 30 tablet 6   glipiZIDE (GLUCOTROL XL) 10 MG 24 hr tablet Take 10 mg by mouth daily.     levothyroxine (SYNTHROID) 25 MCG tablet Take 25 mcg by mouth daily.     Melatonin 5 MG CAPS Take 5 mg by mouth at bedtime as needed (sleep).     metFORMIN (GLUCOPHAGE-XR) 500 MG 24 hr tablet Take 1,000 mg by mouth 2 (two) times daily.     metoprolol succinate (TOPROL-XL) 100 MG 24 hr tablet TAKE 1.5 TABLETS (150 MG TOTAL) BY MOUTH DAILY. TAKE WITH OR IMMEDIATELY FOLLOWING A MEAL. 135 tablet 3   rosuvastatin (CRESTOR) 10 MG tablet Take 10 mg by mouth at bedtime. (Patient not taking: Reported on 02/27/2021)     rosuvastatin (CRESTOR) 20 MG tablet SMARTSIG:1 Tablet(s) By Mouth Every Evening     sacubitril-valsartan (ENTRESTO) 97-103 MG Take 1 tablet by mouth 2 (two) times daily. 60 tablet 6   zinc gluconate 50 MG tablet Take 50 mg by mouth daily.     No current facility-administered medications for this visit.     Past Surgical History:  Procedure Laterality Date   RIGHT/LEFT HEART CATH AND CORONARY/GRAFT ANGIOGRAPHY  N/A 02/08/2020   Procedure: RIGHT/LEFT HEART CATH AND CORONARY/GRAFT ANGIOGRAPHY;  Surgeon: Burnell Blanks, MD;  Location: [place] [facility];  Service: Cardiovascular;  Laterality: N/A;   SKIN CANCER EXCISION       No Known Allergies    Family History  Problem Relation Age of Onset   Heart attack Mother 89   Rheum arthritis Mother    Diabetes Father    Heart disease Father        died after bypass surgery   Cancer Brother        jaw    Stroke Paternal Grandmother      Social History Kenneth Randall reports that he quit smoking about 31 years ago. His smoking use included cigarettes. He has never used smokeless tobacco. Kenneth Randall reports current alcohol use.   Review of Systems CONSTITUTIONAL: No weight loss, fever, chills, weakness or  fatigue.  HEENT: Eyes: No visual loss, blurred vision, double vision or yellow sclerae.No hearing loss, sneezing, congestion, runny nose or sore throat.  SKIN: No rash or itching.  CARDIOVASCULAR: per hpi RESPIRATORY: No shortness of breath, cough or sputum.  GASTROINTESTINAL: No anorexia, nausea, vomiting or diarrhea. No abdominal pain or blood.  GENITOURINARY: No burning on urination, no polyuria NEUROLOGICAL: No headache, dizziness, syncope, paralysis, ataxia, numbness or tingling in the extremities. No change in bowel or bladder control.  MUSCULOSKELETAL: No muscle, back pain, joint pain or stiffness.  LYMPHATICS: No enlarged nodes. No history of splenectomy.  PSYCHIATRIC: No history of depression or anxiety.  ENDOCRINOLOGIC: No reports of sweating, cold or heat intolerance. No polyuria or polydipsia.  Marland Kitchen   Physical Examination Today's Vitals   03/01/21 1424  BP: (!) 152/78  Pulse: 78  SpO2: 98%  Weight: 230 lb 12.8 oz (104.7 kg)  Height: 5' 9.5" (1.765 m)   Body mass index is 33.59 kg/m.  Gen: resting comfortably, no acute distress HEENT: no scleral icterus, pupils equal round and reactive, no palptable cervical adenopathy,  CV: 2/6 systolic murmur rusb Resp: Clear to auscultation bilaterally GI: abdomen is soft, non-tender, non-distended, normal bowel sounds, no hepatosplenomegaly MSK: extremities are warm, no edema.  Skin: warm, no rash Neuro:  no focal deficits Psych: appropriate affect   Diagnostic Studies  Echo: 12/2019 Summary 1. Technically difficult study due to body habitus. 2. Echo contrast utilized to enhance endocardial border definition. 3. The left ventricle is mildly to moderately dilated in size with upper normal wall thickness. 4. The left ventricular systolic function is severely decreased, LVEF is visually estimated at 20-25%. 5. There is moderate mitral valve regurgitation. 6. There is mild to moderate aortic valve stenosis. 7. There is mild  aortic regurgitation. 8. The left atrium is moderately dilated in size. 9. The right ventricle is normal in size, with normal systolic function. 10. The right atrium is mildly dilated in size.   02/2020 RHC/LHC Prox RCA to Mid RCA lesion is 50% stenosed. Mid RCA lesion is 100% stenosed. SVG graft was visualized by angiography and is normal in caliber. Ost LAD to Prox LAD lesion is 100% stenosed. SVG graft was visualized by angiography and is normal in caliber. Prox Cx lesion is 100% stenosed. Mid LAD to Dist LAD lesion is 50% stenosed.   1. Severe triple vessel CAD s/p 5V CABG with 5/5 patent bypass grafts. 2. The LAD is occluded proximally. The LAD and diagonal fill from the patent vein graft. The LIMA does not appear to have been used for bypass. 3. The  Circumflex is occluded in the mid vessel. The patent sequential vein graft fills the proximal and distal OM branches 4. The RCA is occluded in the mid to distal vessel. The distal RCA/PDA and PLA fills from left to right collaterals and from the patent vein graft 5. Normal filling pressures.   Recommendations: Continue medical management of CAD.    11/2019 [facility] Zio patch: min HR 57, Max HR 255, Avg HF 101.4.8% PVCs. Rare supraventricular ectopy, some runs of atach. Short runs of NSVT   03/2020 CMRI IMPRESSION: 1. Mild LV dilatation with severe systolic dysfunction (EF 99991111). 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 Hypokinesis of mid anterior/anterolateral walls and apical anterior/septal/lateral walls. Akinesis of mid inferolateral wall   2. Subendocardial late gadolinium enhancement consistent with prior infarcts in LAD and LCX territories. LGE is <50% transmural suggesting viability in mid anterior/anterolateral walls and apical septal/anterior/inferior/lateral walls. LGE >50% transmural suggesting nonviability at apex. In addition, there is significant thinning in mid inferolateral wall (wall thickness 44m), suggesting nonviability.   3.   Normal RV size and systolic function (EF [id number]   4.  Mild mitral regurgitation (regurgitant fraction 19%)     01/2021 echo 1. Left ventricular ejection fraction, by estimation, is 40 to 45%. The  left ventricle has mildly decreased function. The left ventricle  demonstrates regional wall motion abnormalities (see scoring  diagram/findings for description). The left ventricular   internal cavity size was mildly to moderately dilated. Left ventricular  diastolic parameters are consistent with Grade I diastolic dysfunction  (impaired relaxation). Reduced global longitudinal strain of -10.3%. Would  consider future studies with  Definity contrast.   2. Right ventricular systolic function is normal. The right ventricular  size is normal. Tricuspid regurgitation signal is inadequate for assessing  PA pressure.   3. Left atrial size was mildly dilated.   4. The mitral valve is grossly normal. Mild to moderate mitral valve  regurgitation.   5. The aortic valve has an indeterminant number of cusps. There is  moderate calcification of the aortic valve. Aortic valve regurgitation is  mild. Moderate aortic valve stenosis. Aortic regurgitation PHT measures  758 msec. Aortic valve mean gradient  measures 18.0 mmHg. Dimentionless index 0.36.   6. The inferior vena cava is normal in size with greater than 50%  respiratory variability, suggesting right atrial pressure of 3 mmHg.   Assessment and Plan   1. Chronic systolic HF - [facility] - recent cath with patent grafts, normal filling pressures  - repeat echo shows LVEF is improving, now 40-45%  Hyperkalemia with aldactone previous,and now recurrent hyperkalemia related to entresto 49/'51mg'$  bid. Off both meds, remains just on toprol. K trending down, had labs again with pcp today - let labs settle down, would avoid ACE/ARB/ARNI/aldactone going forward given issues with hyperkalemia. At f/u would consider  fargixa, hydral/nitrates       JArnoldo Lenis M.D.,

## 2021-03-12 NOTE — Progress Notes (Signed)
Office Visit Note  Patient: Kenneth Randall             Date of Birth: [birth year]           MRN: [id number]             PCP: [place] Nation, MD Referring: [place] Nation, MD Visit Date: 03/13/2021   Subjective:  Follow-up (Patient complains of continued joint pain and stiffness, mostly in bilateral hands and feet. )   History of Present Illness: Kenneth Randall is a 75 y.o. male here for follow up for possible psoriatic arthritis with chronic joint pain of back and bilateral feet and ankles. Since our initial visit symptoms remain about the same. Labs and imaging were overall unremarkable but no contraindications for treatment.   02/27/21 [name] is a 75 y.o. male here for evaluation of multiple joint pains with family history of RA and PsA.  He has chronic low back pain that is ongoing for many years and remains his worst area of joint pain.  This is most symptomatic first thing in the morning or after resting for long periods and improves once he is up and active during the day.  He uses Tylenol as needed intermittently no other chronic medications for this.  Within the past 2 years his back pain worsened but he is also developed joint pain in his lower extremities especially knees ankles and feet.  This pain is also worst first thing in the day and with stiffness after immobility.  He has some chronic and ongoing pedal edema related to his congestive heart failure does not notice any swelling specifically localized to the joints.  He has some bilateral hand stiffness but denies much pain or any swelling in this area. He has a history of plaque psoriasis diagnosed about 40 years ago with [facility] dermat[facility] this originally started with well-circumscribed lesions on his palms with superimposed infection around his fingernails.  After these very initial symptoms he has primarily developed skin rashes from his knees to his feet with little upper extremity recurrence.  This is been  controlled for many years with topical therapies. He has a history of congestive heart failure previously severe and in exacerbation in 2021 much improved on medical management following up with cardiology clinic.   Labs reviewed 01/2021 Uric acid 7.9 ANA neg RF neg CBC wnl ESR 13 CMP eGFR 58 K 5.3   Review of Systems  Constitutional:  Negative for fatigue.  HENT:  Negative for mouth sores, mouth dryness and nose dryness.   Eyes:  Negative for pain, itching, visual disturbance and dryness.  Respiratory:  Negative for cough, hemoptysis, shortness of breath and difficulty breathing.   Cardiovascular:  Negative for chest pain, palpitations and swelling in legs/feet.  Gastrointestinal:  Negative for abdominal pain, blood in stool, constipation and diarrhea.  Endocrine: Negative for increased urination.  Genitourinary:  Negative for painful urination.  Musculoskeletal:  Positive for morning stiffness. Negative for joint pain, joint pain, joint swelling, myalgias, muscle weakness, muscle tenderness and myalgias.  Skin:  Positive for rash. Negative for color change and redness.  Allergic/Immunologic: Negative for susceptible to infections.  Neurological:  Negative for dizziness, numbness, headaches, memory loss and weakness.  Hematological:  Negative for swollen glands.  Psychiatric/Behavioral:  Negative for confusion and sleep disturbance.    PMFS History:  Patient Active Problem List   Diagnosis Date Noted   Psoriatic arthritis ([place]) 03/13/2021   Bilateral foot pain 02/27/2021  Low back pain 02/27/2021   Plaque psoriasis 02/27/2021   Diabetes ([place]) 02/27/2021   Ischemic cardiomyopathy    Chronic systolic CHF (congestive heart failure), NYHA class 3 ([place])    Essential (primary) hypertension 11/21/2019   Dyslipidemia 11/21/2019   Atrial arrhythmia 11/21/2019   Mohs defect of left cheek 11/30/2012    Past Medical History:  Diagnosis Date   CAD (coronary artery disease)     Cancer (HCC)    CHF (congestive heart failure) (HCC)    Hypertension    Ischemic cardiomyopathy    PSVT (paroxysmal supraventricular tachycardia) ([place])     Family History  Problem Relation Age of Onset   Heart attack Mother 62   Rheum arthritis Mother    Diabetes Father    Heart disease Father        died after bypass surgery   Cancer Brother        jaw    Stroke Paternal Grandmother    Past Surgical History:  Procedure Laterality Date   RIGHT/LEFT HEART CATH AND CORONARY/GRAFT ANGIOGRAPHY N/A 02/08/2020   Procedure: RIGHT/LEFT HEART CATH AND CORONARY/GRAFT ANGIOGRAPHY;  Surgeon: Burnell Blanks, MD;  Location: [place] [facility];  Service: Cardiovascular;  Laterality: N/A;   SKIN CANCER EXCISION     Social History   Social History Narrative   Not on file   Immunization History  Administered Date(s) Administered   Moderna Sars-Covid-2 Vaccination 09/09/2019, 10/08/2019, 06/04/2020, 12/14/2020     Objective: Vital Signs: BP (!) 150/80 (BP Location: Left Arm, Patient Position: Sitting, Cuff Size: Normal)   Pulse 78   Ht 5' 9.5" (1.765 m)   Wt 226 lb 3.2 oz (102.6 kg)   BMI 32.92 kg/m    Physical Exam Skin:    General: Skin is warm and dry.     Comments: Longitudinal nail ridging b/l without pitting or splitting Psoriasis on anterior shins mild erythema of elbows  Psychiatric:        Mood and Affect: Mood normal.     Musculoskeletal Exam:  Shoulders full ROM no tenderness or swelling Elbows full ROM no tenderness or swelling Wrists full ROM no tenderness or swelling Fingers full ROM heberdon's nodes throughout Bilateral paraspinal muscle tenderness lumbosacral region Knees full ROM no tenderness or swelling  Investigation: No additional findings.  Imaging: XR Foot 2 Views Left  Result Date: 02/27/2021 X-ray left foot 2 views Tibiotalar joint space poorly visualized due to ankle pronation.  Anterior osteophyte present.  Plantar and posterior  calcaneal enthesophytes are present.  Multiple calcifications of the plantar tendon or fascia.  Bone spurring of midfoot joints dorsally.  Degenerative change of the first toe with early lateral osteophyte formation and medial cystic changes.  No periosteal reaction or erosions are seen.  Posterior tibial arterial calcifications are seen. Impression Degenerative arthritis changes and extensive bone spurring most advanced in the first toe and hindfoot  XR Foot 2 Views Right  Result Date: 02/27/2021 X-ray right foot 2 views Tibiotalar joint space is patent with marginal osteophytes present.  Extensive plantar and posterior calcaneal enthesophytes.  Calcification in plantar tendon or fascia.  Midfoot joint spaces are preserved.  Large marginal spurring or irregular bone growth along margin of first metatarsal head.  Other MTPs appear normal. Impression Some degenerative arthritis change worse in the hindfoot with extensive bony spurring throughout no periosteal reaction or erosions seen  XR Lumbar Spine 2-3 Views  Result Date: 02/27/2021 X-ray lumbar spine 2 views No acute abnormalities or evidence  of fractures are seen.  Multilevel degenerative arthritis changes are present.  Large anterior and right lateral bridging osteophytes at T11-T12.  Asymmetric disc height loss most advanced at L2-L3 and L4-L5.  There is loss of the normal lumbar lordosis and retrolisthesis of vertebral body L2 and L3.  Anterior endplate osteophytes present throughout lumbar spine without bridging or effusions.  Subchondral sclerotic changes at L2-L3.  Possible loss of vertebral body height posteriorly at L5.  Iliac artery calcifications are seen. Impression Multilevel degenerative arthritis and spondylosis changes without any findings of ankylosis or erosions  XR Pelvis 1-2 Views  Result Date: 02/27/2021 X-ray pelvis 2 views AP and Ferguson Femoral acetabular joints with preserved joint space bilaterally.  Some marginal  irregularity with probable bone spurring particularly on right.  Some subchondral sclerotic changes increased on inferior margin bilaterally.  SI joints are patent bilaterally with no erosive changes or widening seen.  No abnormal bone mineralization. Impression No evidence of radiographic sacroiliitis.  Mild hip osteoarthritis bilaterally   Recent Labs: Lab Results  Component Value Date   WBC 9.7 02/01/2020   HGB 13.9 02/08/2020   PLT 229 02/01/2020   NA 139 02/14/2021   K 4.9 02/14/2021   CL 106 02/14/2021   CO2 24 02/14/2021   GLUCOSE 158 (H) 02/14/2021   BUN 28 (H) 02/14/2021   CREATININE 1.20 02/14/2021   CALCIUM 9.7 02/14/2021   GFRAA 59 (L) 10/24/2020    Speciality Comments: No specialty comments available.  Procedures:  No procedures performed Allergies: Patient has no known allergies.   Assessment / Plan:     Visit Diagnoses: Psoriatic arthritis ([place])  Psoriatic arthritis suspected based on the active skin disease and distribution and mild swelling of joints. No erosive disease evidence with current workup. He is not ideal candidate for TNF due to CHF. Methotrexate unlikely to control enthesitis and axial disease also high risk due to comorbidities and treatments. I recommend starting Otezla for this and also with the active skin rashes should improve. He has no history of depression or suicidal ideation.  Plaque psoriasis  Skin disease remains mildly active despite topical treatments, I believe R[place] Nail would be an effective option that does not worsen his cardiovascular risks long term.  Orders: No orders of the defined types were placed in this encounter.  No orders of the defined types were placed in this encounter.    Follow-Up Instructions: Return in about 2 months (around 05/13/2021) for PsA new Otezla f/u 8 wks.   Collier Salina, MD  Note - This record has been created using [employer].  Chart creation errors have been sought, but may not always   have been located. Such creation errors do not reflect on  the standard of medical care.

## 2021-03-13 ENCOUNTER — Other Ambulatory Visit: Payer: Self-pay

## 2021-03-13 ENCOUNTER — Telehealth: Payer: Self-pay | Admitting: Pharmacist

## 2021-03-13 ENCOUNTER — Encounter: Payer: Self-pay | Admitting: Internal Medicine

## 2021-03-13 ENCOUNTER — Ambulatory Visit: Payer: PPO | Admitting: Internal Medicine

## 2021-03-13 VITALS — BP 150/80 | HR 78 | Ht 69.5 in | Wt 226.2 lb

## 2021-03-13 DIAGNOSIS — L4050 Arthropathic psoriasis, unspecified: Secondary | ICD-10-CM

## 2021-03-13 DIAGNOSIS — L400 Psoriasis vulgaris: Secondary | ICD-10-CM

## 2021-03-13 NOTE — Telephone Encounter (Signed)
Please start [place].  Dose: '30mg'$  twice daily  Dx: L40.0 (psoriasis), L40.50 (PsA)  History of treatment unknown but based on office note from July 2022, patient has not tried any biologics. Has tried topical treatments which has worked for many years to control his psoriasis  Therapies patient unable to try: TNF inhibitors due to CHF diagnosis  Patient has Medicare and completed patient assistance application for [employer] Safety Net at [place] today with Dr. Benjamine Mola. Pending provider signature.  [name], PharmD, MPH, BCPS Clinical Pharmacist (Rheumatology and Pulmonology)

## 2021-03-13 NOTE — Telephone Encounter (Signed)
Initiated a Prior Authorization request to [employer] for [place] via CoverMyMeds.   Awaiting chart note to be completed to send with PA request.  (Key: [id number] - [id number]

## 2021-03-13 NOTE — Progress Notes (Signed)
Pharmacy Note  Subjective:  Patient presents today to [facility] for follow up office visit.   Patient was seen by the pharmacist for counseling on [place] for plaque psoriasis. He does not have history of depression. Does not have history of CKD.  Objective: CMP     Component Value Date/Time   NA 139 02/14/2021 0912   K 4.9 02/14/2021 0912   CL 106 02/14/2021 0912   CO2 24 02/14/2021 0912   GLUCOSE 158 (H) 02/14/2021 0912   BUN 28 (H) 02/14/2021 0912   CREATININE 1.20 02/14/2021 0912   CALCIUM 9.7 02/14/2021 0912   GFRNONAA 51 (L) 10/24/2020 1104   GFRAA 59 (L) 10/24/2020 1104    CBC    Component Value Date/Time   WBC 9.7 02/01/2020 0743   RBC 4.84 02/01/2020 0743   HGB 13.9 02/08/2020 0900   HCT 41.0 02/08/2020 0900   PLT 229 02/01/2020 0743   MCV 88.0 02/01/2020 0743   MCH 28.5 02/01/2020 0743   MCHC 32.4 02/01/2020 0743   RDW 16.3 (H) 02/01/2020 0743     Assessment/Plan:  Counseled patient that [place] is a PDE 4 inhibitor that works to treat psoriasis and the joint pain and tenderness of psoriatic arthritis.  Counseled patient on purpose, proper use, and adverse effects of Otezla.  Reviewed the most common adverse effects of weight loss, depression, nausea/diarrhea/vomiting, headaches, and nasal congestion.  Advised patient to notify office of any serious changes in mood and/or thoughts of suicide.  Provided patient with medication education material and answered all questions.  Patient consented to [place].  Will apply for Otezla through patient's insurance and update when we receive a response.  Patient dose will be Otezla starter titration pack and then 30 mg twice daily.  Prescription pending insurance approval and once approved patient may pick up sample for starter pack from our office.  Patient verbalized understanding.   Patient completed patient assistance application today for [place] through [place].  [name], PharmD, MPH, BCPS Clinical  Pharmacist (Rheumatology and Pulmonology)

## 2021-03-14 NOTE — Telephone Encounter (Signed)
Signed provider portion received for [facility] patient assistance through [employer]. Placed in pending PAP folder with med list, insurance card copy to be submitted if needed once auth response received.  Prior auth still not submitted - pending signed OV note from Dr. Tristan Schroeder, PharmD, MPH, BCPS Clinical Pharmacist (Rheumatology and Pulmonology)

## 2021-03-14 NOTE — Telephone Encounter (Signed)
[facility] left a voicemail stating patient was given a non-formulary medication and a prior authorization is needed.   Phone [phone] option 3   Ref [id number]

## 2021-03-18 NOTE — Telephone Encounter (Signed)
Received a fax regarding Prior Authorization from [facility] for Kenneth Randall. Authorization has been DENIED because patient has not tried and failed at least one formulary alternative or has reason why he cannot try all of hte alternatives: Rinvoq, Humira, Cosentyx, Enbrel, Skryizi.  Patient has history of CHF (would avoid use of Humira, Enbrel) and history of cardiomyopathy and arrhythmia (would avoid Rinvoq).  No contraindication against use of Cosentyx or Skyrizi  [name], PharmD, MPH, BCPS Clinical Pharmacist (Rheumatology and Pulmonology)

## 2021-03-18 NOTE — Telephone Encounter (Signed)
EXPEDITED prior authorization request faxed to [employer] for Otezla starter pack along with OV note.  Fax: [phone] EOC: [id number] Phone: [phone]

## 2021-03-18 NOTE — Telephone Encounter (Signed)
That is strange to me that injectable biologic medication is preferred over [place]. I would be okay with the alternatives, although [name] strongly preferred oral medication over injection if possible. We would need to clarify this with him before pursuing a change in medication.

## 2021-03-19 NOTE — Telephone Encounter (Signed)
Thanks, it seems like the better choice.

## 2021-03-19 NOTE — Telephone Encounter (Addendum)
Appeal faxed to [facility] Department for [place] including supporting literature (Phase 3b ACTIVE trial).  Copy sent to scan center  Fax: [phone] Phone: [phone]  [name], PharmD, MPH, BCPS Clinical Pharmacist (Rheumatology and Pulmonology)

## 2021-03-19 NOTE — Telephone Encounter (Signed)
After discussion with Dr. Benjamine Mola, will submit expedited appeal for [facility] since patient does not have severe psoriatic arthritis and no erosive changes warranting need to escalate therapy to biologic which places him at increased risk for infections  Kenneth Randall, PharmD, MPH, BCPS Clinical Pharmacist (Rheumatology and Pulmonology)

## 2021-03-21 ENCOUNTER — Other Ambulatory Visit (HOSPITAL_COMMUNITY): Payer: Self-pay

## 2021-03-21 NOTE — Telephone Encounter (Signed)
Received notification from [facility] regarding a prior authorization for OTEZLA starter pack. Authorization has been APPROVED through 08/03/21.   Unable to run test claim.  Phone # [phone]  Patient will likely need [employer] patient assistance. Will submit completed application with prior auth approval letter attached

## 2021-03-21 NOTE — Telephone Encounter (Signed)
Submitted Patient Assistance Application to [employer] for [place] along with provider portion, PA signed patient portion, med list, and insurance card copy. Will update patient when we receive a response.  Fax# [phone] Phone# [phone]  [name], PharmD, MPH, BCPS Clinical Pharmacist (Rheumatology and Pulmonology)

## 2021-03-22 ENCOUNTER — Telehealth: Payer: Self-pay

## 2021-03-22 NOTE — Telephone Encounter (Signed)
Renee from [employer] left a voicemail stating they received the patient's [place] application for patient assistance.  We are missing the full social security number or the medicare ID from the red,white, and blue card to process the application.  Please call and provide this information over the phone so we will be able to process the application for determination.   Phone# [phone]

## 2021-03-22 NOTE — Telephone Encounter (Addendum)
Provided requested info over the phone for patient's [place] PAP application  [name], PharmD, MPH, BCPS Clinical Pharmacist (Rheumatology and Pulmonology)

## 2021-03-25 DIAGNOSIS — E039 Hypothyroidism, unspecified: Secondary | ICD-10-CM | POA: Diagnosis not present

## 2021-03-25 DIAGNOSIS — M5430 Sciatica, unspecified side: Secondary | ICD-10-CM | POA: Diagnosis not present

## 2021-03-25 DIAGNOSIS — I509 Heart failure, unspecified: Secondary | ICD-10-CM | POA: Diagnosis not present

## 2021-03-25 DIAGNOSIS — D72820 Lymphocytosis (symptomatic): Secondary | ICD-10-CM | POA: Diagnosis not present

## 2021-03-25 DIAGNOSIS — E7801 Familial hypercholesterolemia: Secondary | ICD-10-CM | POA: Diagnosis not present

## 2021-03-25 DIAGNOSIS — E1169 Type 2 diabetes mellitus with other specified complication: Secondary | ICD-10-CM | POA: Diagnosis not present

## 2021-03-25 DIAGNOSIS — Z6833 Body mass index (BMI) 33.0-33.9, adult: Secondary | ICD-10-CM | POA: Diagnosis not present

## 2021-03-26 MED ORDER — APREMILAST 30 MG PO TABS: 30.0000 mg | ORAL_TABLET | Freq: Two times a day (BID) | ORAL | 1 refills | Status: DC

## 2021-03-26 MED ORDER — APREMILAST 10 & 20 & 30 MG PO TBPK: ORAL_TABLET | ORAL | 0 refills | Status: DC

## 2021-03-26 NOTE — Telephone Encounter (Addendum)
Received a fax from  [place] regarding an approval for [place] patient assistance from 03/25/21 to 08/03/21.   Phone number: [phone]  Called patient to advise. He has not yet received call from [place]. Provided patient with phone number to schedule shipment and recommended that he call tomorrow.  We also reviewed that he is only approved through 08/03/21 and that [place] will mail him renewal application in October/November 2022. He verbalized understanding and does not have any questions or concerns with starting Otezla at this time.  Ordered Otezla starter dose and maintenance rx as no print to add to patient's chart. Future Otezla refills to be sent to [facility] (contracted pharmacy for [employer] for [employer])  F/u with Dr. Benjamine Mola scheduled for 05/15/21  [name], PharmD, MPH, BCPS Clinical Pharmacist (Rheumatology and Pulmonology)

## 2021-03-27 DIAGNOSIS — E039 Hypothyroidism, unspecified: Secondary | ICD-10-CM | POA: Diagnosis not present

## 2021-04-01 NOTE — Progress Notes (Signed)
Cardiology Office Note  Date: 04/02/2021   ID: [name], [name] [birth year], MRN [id number]  PCP:  [place], MD  Cardiologist:  Carlyle Dolly, MD Electrophysiologist:  None   Chief Complaint: 7-monthfollow-up  History of Present Illness: Kenneth CERNEYis a 75y.o. male with a history of CAD, CHF, ischemic cardiomyopathy, PSVT, HTN, cancer.  Previous cardiac catheterization July 2021 demonstrated occluded LAD, occluded LCx, occluded RCA.  SVG-RPDA, SVG-D2/distal LAD patent.  Sequential graft to OM1 patent.  Mean PA pressure 20, pulmonary capillary wedge pressure 10, LVEDP 7.  Cardiac index 2.5.  August 2021 cardiac MRI consistent with prior scar.  LVEF 30%  He was last seen by Dr. BHarl Bowieon 03/01/2021.  He was seeing patient for chronic systolic heart failure.  [facility]  This was a new diagnosis for the patient.  He had significant volume overload.  He was diuresed and started on medical therapy.  PCP labs showed increase potassium.  Aldactone was stopped.  Repeat echo June 2022 with EF of 40 to 45%.  Had no recent shortness of breath, DOE.  No lower extremity edema. EDelene Lollwas ordered to be increased to 97/103 p.o. twice daily on 01/31/2021.  He had not taken the higher dose.  He remained on 49/51 mg p.o. bid.  He had hyperkalemia with Aldactone previously with recurrent hyperkalemia related to Entresto 49/51 mg p.o. twice daily.  He was off both Aldactone and Entresto.  He was remaining on Toprol.  Potassium was trending down.  He had follow-up labs with PCP on the day of visit.  Plan was to let labs calm down and would avoid ACE/ARB/ARNI/Aldactone.  At follow-up will consider Farxiga, hydralazine, nitrates   He is here for follow-up today.  He has no complaints.  States he has been working out in his yard without any anginal or exertional symptoms.  Denies any SOB or DOE, orthostatic symptoms, palpitations or arrhythmias, PND,  orthopnea, lower extremity edema.  Recent labs showed improvement of potassium to 4.9 with creatinine of 1.14 and GFR of 56 at PCP office.  He states Dr. WJimmye Normandecreased his spironolactone to 12.5 mg daily  since last visit here per his statement.  Dr. BHarl Bowieat last visit stated patient was to avoid spironolactone.  Blood pressure improved from last visit.  Last visit BP was 150/80 on 03/13/2021.  Today blood pressure 130/84.  Patient states PCP plans on starting him on Farxiga and had spoke to him about starting FIranat recent visit.    Past Medical History:  Diagnosis Date   CAD (coronary artery disease)    Cancer (HCC)    CHF (congestive heart failure) (HCC)    Hypertension    Ischemic cardiomyopathy    PSVT (paroxysmal [place]ricular tachycardia) ([place]     Past Surgical History:  Procedure Laterality Date   RIGHT/LEFT HEART CATH AND CORONARY/GRAFT ANGIOGRAPHY N/A 02/08/2020   Procedure: RIGHT/LEFT HEART CATH AND CORONARY/GRAFT ANGIOGRAPHY;  Surgeon: MBurnell Blanks MD;  [facility];  Service: Cardiovascular;  Laterality: N/A;   SKIN CANCER EXCISION      Current Outpatient Medications  Medication Sig Dispense Refill   acetaminophen (TYLENOL) 650 MG CR tablet Take 1,300 mg by mouth every 8 (eight) hours as needed for pain.     Apremilast 10 & 20 & 30 MG TBPK Day 1: 10 mg in AM. Day 2: 10 mg twice  daily; Day 3: 10 mg in AM and 20 mg in PM; Day 4: 20 mg twice daily; Day 5: 20 mg in AM and 30 mg in PM. Maintenance dose: 30 mg twice daily starting on day 6. 55 each 0   [START ON 04/29/2021] Apremilast 30 MG TABS Take 1 tablet (30 mg total) by mouth 2 (two) times daily. 60 tablet 1   Ascorbic Acid (VITAMIN C) 1000 MG tablet Take 1,000 mg by mouth daily.     aspirin EC 81 MG tablet Take 1 tablet (81 mg total) by mouth daily. Swallow whole.     Ca Carbonate-Mag Hydroxide (ROLAIDS PO) Take 1 tablet by mouth daily as needed (indigestion).     Cholecalciferol  (DIALYVITE VITAMIN D 5000) 125 MCG (5000 UT) capsule Take 5,000 Units by mouth daily.     Coenzyme Q10 (COQ10) 100 MG CAPS Take 100 mg by mouth daily.     Elastic Bandages & Supports (MEDICAL COMPRESSION SOCKS) MISC 1 each by Does not apply route as directed. 1 each 0   furosemide (LASIX) 20 MG tablet Take 1 tablet (20 mg total) by mouth daily. 30 tablet 6   glipiZIDE (GLUCOTROL XL) 10 MG 24 hr tablet Take 10 mg by mouth daily.     hydrALAZINE (APRESOLINE) 25 MG tablet Take 1 tablet (25 mg total) by mouth 2 (two) times daily. 180 tablet 1   levothyroxine (SYNTHROID) 25 MCG tablet Take 25 mcg by mouth daily.     Melatonin 5 MG CAPS Take 5 mg by mouth at bedtime as needed (sleep).     metFORMIN (GLUCOPHAGE-XR) 500 MG 24 hr tablet Take 1,000 mg by mouth 2 (two) times daily.     metoprolol succinate (TOPROL-XL) 100 MG 24 hr tablet TAKE 1.5 TABLETS (150 MG TOTAL) BY MOUTH DAILY. TAKE WITH OR IMMEDIATELY FOLLOWING A MEAL. 135 tablet 3   rosuvastatin (CRESTOR) 20 MG tablet SMARTSIG:1 Tablet(s) By Mouth Every Evening     sacubitril-valsartan (ENTRESTO) 97-103 MG Take 1 tablet by mouth 2 (two) times daily. 60 tablet 6   zinc gluconate 50 MG tablet Take 50 mg by mouth daily.     No current facility-administered medications for this visit.   Allergies:  Patient has no known allergies.   Social History: The patient  reports that he quit smoking about 31 years ago. His smoking use included cigarettes. He has never used smokeless tobacco. He reports current alcohol use. He reports that he does not use drugs.   Family History: The patient's family history includes Cancer in his brother; Diabetes in his father; Heart attack (age of onset: 40) in his mother; Heart disease in his father; Rheum arthritis in his mother; Stroke in his paternal grandmother.   ROS:  Please see the history of present illness. Otherwise, complete review of systems is positive for none.  All other systems are reviewed and negative.    Physical Exam: VS:  BP 130/84   Pulse 82   Ht '5\' 10"'$  (1.778 m)   Wt 225 lb (102.1 kg)   SpO2 96%   BMI 32.28 kg/m , BMI Body mass index is 32.28 kg/m.  Wt Readings from Last 3 Encounters:  04/02/21 225 lb (102.1 kg)  03/13/21 226 lb 3.2 oz (102.6 kg)  03/01/21 230 lb 12.8 oz (104.7 kg)    General: Patient appears comfortable at rest. Neck: Supple, no elevated JVP or carotid bruits, no thyromegaly. Lungs: Clear to auscultation, nonlabored breathing at rest. Cardiac: Regular rate and  rhythm, no S3 or significant systolic murmur, no pericardial rub. Extremities: No pitting edema, distal pulses 2+. Skin: Warm and dry. Musculoskeletal: No kyphosis. Neuropsychiatric: Alert and oriented x3, affect grossly appropriate.  ECG:    Recent Labwork: 02/14/2021: BUN 28; Creat 1.20; Potassium 4.9; Sodium 139  No results found for: CHOL, TRIG, HDL, CHOLHDL, VLDL, LDLCALC, LDLDIRECT  Other Studies Reviewed Today:  Echo: 12/2019 Summary 1. Technically difficult study due to body habitus. 2. Echo contrast utilized to enhance endocardial border definition. 3. The left ventricle is mildly to moderately dilated in size with upper normal wall thickness. 4. The left ventricular systolic function is severely decreased, LVEF is visually estimated at 20-25%. 5. There is moderate mitral valve regurgitation. 6. There is mild to moderate aortic valve stenosis. 7. There is mild aortic regurgitation. 8. The left atrium is moderately dilated in size. 9. The right ventricle is normal in size, with normal systolic function. 10. The right atrium is mildly dilated in size.   02/2020 RHC/LHC Prox RCA to Mid RCA lesion is 50% stenosed. Mid RCA lesion is 100% stenosed. SVG graft was visualized by angiography and is normal in caliber. Ost LAD to Prox LAD lesion is 100% stenosed. SVG graft was visualized by angiography and is normal in caliber. Prox Cx lesion is 100% stenosed. Mid LAD to Dist LAD lesion is  50% stenosed.   1. Severe triple vessel CAD s/p 5V CABG with 5/5 patent bypass grafts. 2. The LAD is occluded proximally. The LAD and diagonal fill from the patent vein graft. The LIMA does not appear to have been used for bypass. 3. The Circumflex is occluded in the mid vessel. The patent sequential vein graft fills the proximal and distal OM branches 4. The RCA is occluded in the mid to distal vessel. The distal RCA/PDA and PLA fills from left to right collaterals and from the patent vein graft 5. Normal filling pressures.   Recommendations: Continue medical management of CAD.    11/2019 [facility] Zio patch: min HR 57, Max HR 255, Avg HF 101.4.8% PVCs. Rare supraventricular ectopy, some runs of atach. Short runs of NSVT   03/2020 [facility] IMPRESSION: 1. Mild LV dilatation with severe systolic dysfunction (EF 99991111). 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 Hypokinesis of mid anterior/anterolateral walls and apical anterior/septal/lateral walls. Akinesis of mid inferolateral wall   2. Subendocardial late gadolinium enhancement consistent with prior infarcts in LAD and LCX territories. LGE is <50% transmural suggesting viability in mid anterior/anterolateral walls and apical septal/anterior/inferior/lateral walls. LGE >50% transmural suggesting nonviability at apex. In addition, there is significant thinning in mid inferolateral wall (wall thickness 66m), suggesting nonviability.   3.  Normal RV size and systolic function (EF [id number]   4.  Mild mitral regurgitation (regurgitant fraction 19%)     01/2021 echo 1. Left ventricular ejection fraction, by estimation, is 40 to 45%. The  left ventricle has mildly decreased function. The left ventricle  demonstrates regional wall motion abnormalities (see scoring  diagram/findings for description). The left ventricular   internal cavity size was mildly to moderately dilated. Left ventricular  diastolic parameters are consistent with Grade I diastolic dysfunction   (impaired relaxation). Reduced global longitudinal strain of -10.3%. Would  consider future studies with  Definity contrast.   2. Right ventricular systolic function is normal. The right ventricular  size is normal. Tricuspid regurgitation signal is inadequate for assessing  PA pressure.   3. Left atrial size was mildly dilated.   4. The mitral valve is  grossly normal. Mild to moderate mitral valve  regurgitation.   5. The aortic valve has an indeterminant number of cusps. There is  moderate calcification of the aortic valve. Aortic valve regurgitation is  mild. Moderate aortic valve stenosis. Aortic regurgitation PHT measures  758 msec. Aortic valve mean gradient  measures 18.0 mmHg. Dimentionless index 0.36.   6. The inferior vena cava is normal in size with greater than 50%  respiratory variability, suggesting right atrial pressure of 3 mmHg.    Assessment and Plan:  1. Chronic systolic heart failure ([place])   2. Hyperkalemia    1. Chronic systolic heart failure (HCC) Denies any anginal or exertional symptoms, DOE or SOB, weight gain, lower extremity edema.  Patient states his PCP decreased his spironolactone to 12.5 mg daily.  At recent visit with Dr. Harl Bowie patient was to avoid Aldactone/ARB/ACE/ARNI Patient states PCP discussed plans of starting him on Farxiga in the near future.  We will start low-dose hydralazine 25 mg p.o. twice daily for now.  We discussed adding Imdur at next visit if he tolerates hydralazine.  2. Hyperkalemia Recent improvement and potassium with PCP labs on 03/01/2021 of 4.9 potassium.  Medication Adjustments/Labs and Tests Ordered: Current medicines are reviewed at length with the patient today.  Concerns regarding medicines are outlined above.   Disposition: Follow-up with Dr. Harl Bowie or APP 1 month  Signed, Levell July, NP 04/02/2021 8:36 AM    [facility] [place], [place], [place] [address] Phone: [phone]; Fax: [phone]

## 2021-04-02 ENCOUNTER — Other Ambulatory Visit: Payer: Self-pay

## 2021-04-02 ENCOUNTER — Ambulatory Visit: Payer: PPO | Admitting: Family Medicine

## 2021-04-02 ENCOUNTER — Encounter: Payer: Self-pay | Admitting: Family Medicine

## 2021-04-02 VITALS — BP 130/84 | HR 82 | Ht 70.0 in | Wt 225.0 lb

## 2021-04-02 DIAGNOSIS — E875 Hyperkalemia: Secondary | ICD-10-CM

## 2021-04-02 DIAGNOSIS — I5022 Chronic systolic (congestive) heart failure: Secondary | ICD-10-CM | POA: Diagnosis not present

## 2021-04-02 MED ORDER — HYDRALAZINE HCL 25 MG PO TABS: 25.0000 mg | ORAL_TABLET | Freq: Two times a day (BID) | ORAL | 1 refills | Status: DC

## 2021-04-02 NOTE — Patient Instructions (Addendum)
Medication Instructions:  Your physician has recommended you make the following change in your medication:  Start hydralazine 25 mg twice daily Continue other medications the same  Labwork: none  Testing/Procedures: none  Follow-Up: Your physician recommends that you schedule a follow-up appointment in: 1 month  Any Other Special Instructions Will Be Listed Below (If Applicable).  If you need a refill on your cardiac medications before your next appointment, please call your pharmacy.

## 2021-04-03 ENCOUNTER — Other Ambulatory Visit: Payer: Self-pay | Admitting: Cardiology

## 2021-04-11 DIAGNOSIS — Z6832 Body mass index (BMI) 32.0-32.9, adult: Secondary | ICD-10-CM | POA: Diagnosis not present

## 2021-04-11 DIAGNOSIS — M5430 Sciatica, unspecified side: Secondary | ICD-10-CM | POA: Diagnosis not present

## 2021-04-11 DIAGNOSIS — E1169 Type 2 diabetes mellitus with other specified complication: Secondary | ICD-10-CM | POA: Diagnosis not present

## 2021-04-11 DIAGNOSIS — I509 Heart failure, unspecified: Secondary | ICD-10-CM | POA: Diagnosis not present

## 2021-04-11 DIAGNOSIS — D72820 Lymphocytosis (symptomatic): Secondary | ICD-10-CM | POA: Diagnosis not present

## 2021-04-11 DIAGNOSIS — E039 Hypothyroidism, unspecified: Secondary | ICD-10-CM | POA: Diagnosis not present

## 2021-04-29 NOTE — Progress Notes (Addendum)
Cardiology Office Note  Date: 04/30/2021   ID: Kenneth, Randall Dec 16, 1945, MRN [id number]  PCP:  [place], MD  Cardiologist:  Carlyle Dolly, MD Electrophysiologist:  None   Chief Complaint: 79-month follow-up  History of Present Illness: Kenneth Randall is a 75 y.o. male with a history of CAD, CHF, ischemic cardiomyopathy, PSVT, HTN, cancer.  Previous cardiac catheterization July 2021 demonstrated occluded LAD, occluded LCx, occluded RCA.  SVG-RPDA, SVG-D2/distal LAD patent.  Sequential graft to OM1 patent.  Mean PA pressure 20, pulmonary capillary wedge pressure 10, LVEDP 7.  Cardiac index 2.5.  August 2021 cardiac MRI consistent with prior scar.  LVEF 30%  He was last seen by Dr. Harl Bowie on 03/01/2021.  He was seeing patient for chronic systolic heart failure.  Previous echo from May 2021 at [facility] with EF of 20 to 25%.  This was a new diagnosis for the patient.  He had significant volume overload.  He was diuresed and started on medical therapy.  PCP labs showed increase potassium.  Aldactone was stopped.  Repeat echo June 2022 with EF of 40 to 45%.  Had no recent shortness of breath, DOE.  No lower extremity edema. Delene Loll was ordered to be increased to 97/103 p.o. twice daily on 01/31/2021.  He had not taken the higher dose.  He remained on 49/51 mg p.o. bid.  He had hyperkalemia with Aldactone previously with recurrent hyperkalemia related to Entresto 49/51 mg p.o. twice daily.  He was off both Aldactone and Entresto.  He was remaining on Toprol.  Potassium was trending down.  He had follow-up labs with PCP on the day of visit.  Plan was to let labs calm down and would avoid ACE/ARB/ARNI/Aldactone.  At follow-up will consider Farxiga, hydralazine, nitrates   Is here last visit for follow-up.  He had no complaint.  He had been working out in his yard without any anginal or exertional symptoms.  Denied any SOB or DOE, orthostatic symptoms, palpitations or arrhythmias, PND,  orthopnea, lower extremity edema.  Recent labs showed improvement of potassium to 4.9 with creatinine of 1.14 and GFR of 56 at PCP office.  He stated Dr. Jimmye Norman decreased his spironolactone to 12.5 mg daily  since last visit here per his statement.  Dr. Harl Bowie at last visit stated patient was to avoid spironolactone.  Blood pressure improved from last visit.  Last visit BP was 150/80 on 03/13/2021.  He stated PCP planned on starting him on Farxiga and had spoke to him about starting Farxiga at recent visit.  He is here for 1 month follow-up today states he has been doing well.  He denies any issues with shortness of breath, DOE, PND, orthopnea, weight gain, lower extremity edema.  He states his PCP attempted to start [place] but but insurance would not pay.  Jardiance was started 10 mg daily.  He is tolerating Jardiance and also addition of hydralazine since last visit.  We discussed possible advancement of therapy with addition of Imdur.  For now he wants to defer addition of Imdur stating he is doing well and sees no need for.  We discussed the role of Imdur and GDMT of ischemic cardiomyopathy/heart failure.  He verbalizes understanding but wants to defer for now.  His blood pressure is well controlled today at 126/80.  He states he is no longer on spironolactone.  Current medical regimen includes; Jardiance 10 mg daily, hydralazine 25 mg p.o. twice daily, Crestor 20 mg daily, furosemide 20  mg daily, Toprol-XL 150 mg p.o. daily, aspirin 81 mg daily.    Past Medical History:  Diagnosis Date   CAD (coronary artery disease)    Cancer (HCC)    CHF (congestive heart failure) (HCC)    Hypertension    Ischemic cardiomyopathy    PSVT (paroxysmal supraventricular tachycardia) ([place])     Past Surgical History:  Procedure Laterality Date   RIGHT/LEFT HEART CATH AND CORONARY/GRAFT ANGIOGRAPHY N/A 02/08/2020   Procedure: RIGHT/LEFT HEART CATH AND CORONARY/GRAFT ANGIOGRAPHY;  Surgeon: Burnell Blanks,  MD;  Location: [facility];  Service: Cardiovascular;  Laterality: N/A;   SKIN CANCER EXCISION      Current Outpatient Medications  Medication Sig Dispense Refill   acetaminophen (TYLENOL) 650 MG CR tablet Take 1,300 mg by mouth every 8 (eight) hours as needed for pain.     Ascorbic Acid (VITAMIN C) 1000 MG tablet Take 1,000 mg by mouth daily.     aspirin EC 81 MG tablet Take 1 tablet (81 mg total) by mouth daily. Swallow whole.     Ca Carbonate-Mag Hydroxide (ROLAIDS PO) Take 1 tablet by mouth daily as needed (indigestion).     Cholecalciferol (DIALYVITE VITAMIN D 5000) 125 MCG (5000 UT) capsule Take 5,000 Units by mouth daily.     Coenzyme Q10 (COQ10) 100 MG CAPS Take 100 mg by mouth daily.     Elastic Bandages & Supports (MEDICAL COMPRESSION SOCKS) MISC 1 each by Does not apply route as directed. 1 each 0   furosemide (LASIX) 20 MG tablet Take 1 tablet (20 mg total) by mouth daily. 30 tablet 6   glipiZIDE (GLUCOTROL XL) 10 MG 24 hr tablet Take 10 mg by mouth daily.     hydrALAZINE (APRESOLINE) 25 MG tablet Take 1 tablet (25 mg total) by mouth 2 (two) times daily. 180 tablet 1   JARDIANCE 10 MG TABS tablet Take 10 mg by mouth daily.     levothyroxine (SYNTHROID) 25 MCG tablet Take 25 mcg by mouth daily.     Melatonin 5 MG CAPS Take 5 mg by mouth at bedtime as needed (sleep).     metFORMIN (GLUCOPHAGE-XR) 500 MG 24 hr tablet Take 1,000 mg by mouth 2 (two) times daily.     metoprolol succinate (TOPROL-XL) 100 MG 24 hr tablet TAKE 1.5 TABLETS (150 MG TOTAL) BY MOUTH DAILY. TAKE WITH OR IMMEDIATELY FOLLOWING A MEAL. 135 tablet 3   rosuvastatin (CRESTOR) 20 MG tablet SMARTSIG:1 Tablet(s) By Mouth Every Evening     zinc gluconate 50 MG tablet Take 50 mg by mouth daily.     Apremilast 10 & 20 & 30 MG TBPK Day 1: 10 mg in AM. Day 2: 10 mg twice daily; Day 3: 10 mg in AM and 20 mg in PM; Day 4: 20 mg twice daily; Day 5: 20 mg in AM and 30 mg in PM. Maintenance dose: 30 mg twice daily starting  on day 6. (Patient not taking: Reported on 04/30/2021) 55 each 0   Apremilast 30 MG TABS Take 1 tablet (30 mg total) by mouth 2 (two) times daily. (Patient not taking: Reported on 04/30/2021) 60 tablet 1   No current facility-administered medications for this visit.   Allergies:  Patient has no known allergies.   Social History: The patient  reports that he quit smoking about 31 years ago. His smoking use included cigarettes. He has never used smokeless tobacco. He reports current alcohol use. He reports that he does not use drugs.  Family History: The patient's family history includes Cancer in his brother; Diabetes in his father; Heart attack (age of onset: 41) in his mother; Heart disease in his father; Rheum arthritis in his mother; Stroke in his paternal grandmother.   ROS:  Please see the history of present illness. Otherwise, complete review of systems is positive for none.  All other systems are reviewed and negative.   Physical Exam: VS:  BP 126/80   Pulse 79   Ht 5\' 10"  (1.778 m)   Wt 225 lb 6.4 oz (102.2 kg)   SpO2 98%   BMI 32.34 kg/m , BMI Body mass index is 32.34 kg/m.  Wt Readings from Last 3 Encounters:  04/30/21 225 lb 6.4 oz (102.2 kg)  04/02/21 225 lb (102.1 kg)  03/13/21 226 lb 3.2 oz (102.6 kg)    General: Patient appears comfortable at rest. Neck: Supple, no elevated JVP or carotid bruits, no thyromegaly. Lungs: Clear to auscultation, nonlabored breathing at rest. Cardiac: Regular rate and rhythm, no S3 or significant systolic murmur, no pericardial rub. Extremities: No pitting edema, distal pulses 2+. Skin: Warm and dry. Musculoskeletal: No kyphosis. Neuropsychiatric: Alert and oriented x3, affect grossly appropriate.  ECG:    Recent Labwork: 02/14/2021: BUN 28; Creat 1.20; Potassium 4.9; Sodium 139  No results found for: CHOL, TRIG, HDL, CHOLHDL, VLDL, LDLCALC, LDLDIRECT  Other Studies Reviewed Today:  Echo: 12/2019 Summary 1. Technically difficult  study due to body habitus. 2. Echo contrast utilized to enhance endocardial border definition. 3. The left ventricle is mildly to moderately dilated in size with upper normal wall thickness. 4. The left ventricular systolic function is severely decreased, LVEF is visually estimated at 20-25%. 5. There is moderate mitral valve regurgitation. 6. There is mild to moderate aortic valve stenosis. 7. There is mild aortic regurgitation. 8. The left atrium is moderately dilated in size. 9. The right ventricle is normal in size, with normal systolic function. 10. The right atrium is mildly dilated in size.   02/2020 RHC/LHC Prox RCA to Mid RCA lesion is 50% stenosed. Mid RCA lesion is 100% stenosed. SVG graft was visualized by angiography and is normal in caliber. Ost LAD to Prox LAD lesion is 100% stenosed. SVG graft was visualized by angiography and is normal in caliber. Prox Cx lesion is 100% stenosed. Mid LAD to Dist LAD lesion is 50% stenosed.   1. Severe triple vessel CAD s/p 5V CABG with 5/5 patent bypass grafts. 2. The LAD is occluded proximally. The LAD and diagonal fill from the patent vein graft. The LIMA does not appear to have been used for bypass. 3. The Circumflex is occluded in the mid vessel. The patent sequential vein graft fills the proximal and distal OM branches 4. The RCA is occluded in the mid to distal vessel. The distal RCA/PDA and PLA fills from left to right collaterals and from the patent vein graft 5. Normal filling pressures.   Recommendations: Continue medical management of CAD.    11/2019 [facility] patch: min HR 57, Max HR 255, Avg HF 101.4.8% PVCs. Rare supraventricular ectopy, some runs of atach. Short runs of NSVT   03/2020 [facility] IMPRESSION: 1. Mild LV dilatation with severe systolic dysfunction (EF 36%). 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 Hypokinesis of mid anterior/anterolateral walls and apical anterior/septal/lateral walls. Akinesis of mid inferolateral wall    2. Subendocardial late gadolinium enhancement consistent with prior infarcts in LAD and LCX territories. LGE is <50% transmural suggesting viability in mid anterior/anterolateral walls and  apical septal/anterior/inferior/lateral walls. LGE >50% transmural suggesting nonviability at apex. In addition, there is significant thinning in mid inferolateral wall (wall thickness 45mm), suggesting nonviability.   3.  Normal RV size and systolic function (EF 10%)   4.  Mild mitral regurgitation (regurgitant fraction 19%)     01/2021 echo 1. Left ventricular ejection fraction, by estimation, is 40 to 45%. The  left ventricle has mildly decreased function. The left ventricle  demonstrates regional wall motion abnormalities (see scoring  diagram/findings for description). The left ventricular   internal cavity size was mildly to moderately dilated. Left ventricular  diastolic parameters are consistent with Grade I diastolic dysfunction  (impaired relaxation). Reduced global longitudinal strain of -10.3%. Would  consider future studies with  Definity contrast.   2. Right ventricular systolic function is normal. The right ventricular  size is normal. Tricuspid regurgitation signal is inadequate for assessing  PA pressure.   3. Left atrial size was mildly dilated.   4. The mitral valve is grossly normal. Mild to moderate mitral valve  regurgitation.   5. The aortic valve has an indeterminant number of cusps. There is  moderate calcification of the aortic valve. Aortic valve regurgitation is  mild. Moderate aortic valve stenosis. Aortic regurgitation PHT measures  758 msec. Aortic valve mean gradient  measures 18.0 mmHg. Dimentionless index 0.36.   6. The inferior vena cava is normal in size with greater than 50%  respiratory variability, suggesting right atrial pressure of 3 mmHg.    Assessment and Plan:  1. Chronic systolic heart failure ([place])   2. Hyperkalemia     1. Chronic systolic  heart failure (HCC) Denies any anginal or exertional symptoms, DOE or SOB, weight gain, lower extremity edema.  At recent visit with Dr. Harl Bowie patient was to avoid Aldactone/ARB/ACE/ARNI His PCP recently started him on Jardiance 10 mg.  He is tolerating hydralazine 25 mg p.o. twice daily.  We discussed adding Imdur today.  He wants to defer starting Imdur for now.  Continue Jardiance 10 mg daily, hydralazine 25 mg p.o. twice daily, Toprol-XL 150 mg daily, Lasix 20 mg daily.  2. Hyperkalemia Recent improvement and potassium with PCP labs on 03/01/2021 of 4.9 potassium.  Medication Adjustments/Labs and Tests Ordered: Current medicines are reviewed at length with the patient today.  Concerns regarding medicines are outlined above.   Disposition: Follow-up with Dr. Harl Bowie or APP 6 months.  Signed, Levell July, NP 04/30/2021 8:28 AM    [facility] [place], [place],  [address] Phone: [phone]; Fax: [phone]

## 2021-04-30 ENCOUNTER — Encounter: Payer: Self-pay | Admitting: Family Medicine

## 2021-04-30 ENCOUNTER — Ambulatory Visit: Payer: PPO | Admitting: Family Medicine

## 2021-04-30 VITALS — BP 126/80 | HR 79 | Ht 70.0 in | Wt 225.4 lb

## 2021-04-30 DIAGNOSIS — I5022 Chronic systolic (congestive) heart failure: Secondary | ICD-10-CM

## 2021-04-30 DIAGNOSIS — E875 Hyperkalemia: Secondary | ICD-10-CM | POA: Diagnosis not present

## 2021-04-30 NOTE — Patient Instructions (Signed)

## 2021-05-03 ENCOUNTER — Other Ambulatory Visit: Payer: Self-pay | Admitting: Cardiology

## 2021-05-07 DIAGNOSIS — E039 Hypothyroidism, unspecified: Secondary | ICD-10-CM | POA: Diagnosis not present

## 2021-05-07 DIAGNOSIS — E1169 Type 2 diabetes mellitus with other specified complication: Secondary | ICD-10-CM | POA: Diagnosis not present

## 2021-05-07 DIAGNOSIS — I509 Heart failure, unspecified: Secondary | ICD-10-CM | POA: Diagnosis not present

## 2021-05-07 DIAGNOSIS — Z6833 Body mass index (BMI) 33.0-33.9, adult: Secondary | ICD-10-CM | POA: Diagnosis not present

## 2021-05-07 DIAGNOSIS — D72820 Lymphocytosis (symptomatic): Secondary | ICD-10-CM | POA: Diagnosis not present

## 2021-05-07 DIAGNOSIS — M5430 Sciatica, unspecified side: Secondary | ICD-10-CM | POA: Diagnosis not present

## 2021-05-15 ENCOUNTER — Ambulatory Visit: Payer: PPO | Admitting: Internal Medicine

## 2021-07-09 DIAGNOSIS — C44619 Basal cell carcinoma of skin of left upper limb, including shoulder: Secondary | ICD-10-CM | POA: Diagnosis not present

## 2021-08-08 DIAGNOSIS — I509 Heart failure, unspecified: Secondary | ICD-10-CM | POA: Diagnosis not present

## 2021-08-08 DIAGNOSIS — E039 Hypothyroidism, unspecified: Secondary | ICD-10-CM | POA: Diagnosis not present

## 2021-08-08 DIAGNOSIS — Z6841 Body Mass Index (BMI) 40.0 and over, adult: Secondary | ICD-10-CM | POA: Diagnosis not present

## 2021-08-08 DIAGNOSIS — E1169 Type 2 diabetes mellitus with other specified complication: Secondary | ICD-10-CM | POA: Diagnosis not present

## 2021-08-08 DIAGNOSIS — M5430 Sciatica, unspecified side: Secondary | ICD-10-CM | POA: Diagnosis not present

## 2021-08-08 DIAGNOSIS — E7801 Familial hypercholesterolemia: Secondary | ICD-10-CM | POA: Diagnosis not present

## 2021-08-08 DIAGNOSIS — D72820 Lymphocytosis (symptomatic): Secondary | ICD-10-CM | POA: Diagnosis not present

## 2021-08-29 DIAGNOSIS — D485 Neoplasm of uncertain behavior of skin: Secondary | ICD-10-CM | POA: Diagnosis not present

## 2021-08-29 DIAGNOSIS — L570 Actinic keratosis: Secondary | ICD-10-CM | POA: Diagnosis not present

## 2021-09-24 ENCOUNTER — Other Ambulatory Visit: Payer: Self-pay

## 2021-09-24 DIAGNOSIS — C44619 Basal cell carcinoma of skin of left upper limb, including shoulder: Secondary | ICD-10-CM | POA: Diagnosis not present

## 2021-09-24 DIAGNOSIS — L989 Disorder of the skin and subcutaneous tissue, unspecified: Secondary | ICD-10-CM | POA: Diagnosis not present

## 2021-09-24 MED ORDER — HYDRALAZINE HCL 25 MG PO TABS: 25.0000 mg | ORAL_TABLET | Freq: Two times a day (BID) | ORAL | 1 refills | Status: DC

## 2021-10-11 DIAGNOSIS — L400 Psoriasis vulgaris: Secondary | ICD-10-CM | POA: Diagnosis not present

## 2021-10-28 ENCOUNTER — Encounter: Payer: Self-pay | Admitting: Cardiology

## 2021-10-28 ENCOUNTER — Encounter: Payer: Self-pay | Admitting: *Deleted

## 2021-10-28 ENCOUNTER — Ambulatory Visit: Payer: PPO | Admitting: Cardiology

## 2021-10-28 VITALS — BP 130/90 | HR 60 | Ht 69.5 in | Wt 224.6 lb

## 2021-10-28 DIAGNOSIS — I10 Essential (primary) hypertension: Secondary | ICD-10-CM | POA: Diagnosis not present

## 2021-10-28 DIAGNOSIS — E782 Mixed hyperlipidemia: Secondary | ICD-10-CM

## 2021-10-28 DIAGNOSIS — I5022 Chronic systolic (congestive) heart failure: Secondary | ICD-10-CM | POA: Diagnosis not present

## 2021-10-28 DIAGNOSIS — I350 Nonrheumatic aortic (valve) stenosis: Secondary | ICD-10-CM

## 2021-10-28 DIAGNOSIS — I2510 Atherosclerotic heart disease of native coronary artery without angina pectoris: Secondary | ICD-10-CM

## 2021-10-28 MED ORDER — ISOSORBIDE MONONITRATE ER 30 MG PO TB24: 15.0000 mg | ORAL_TABLET | Freq: Every day | ORAL | 6 refills | Status: DC

## 2021-10-28 NOTE — Patient Instructions (Signed)
Medication Instructions:  ?Begin Imdur '15mg'$  daily ?Continue all other medications.    ? ?Labwork: ?none ? ?Testing/Procedures: ?Your physician has requested that you have an echocardiogram. Echocardiography is a painless test that uses sound waves to create images of your heart. It provides your doctor with information about the size and shape of your heart and how well your heart?s chambers and valves are working. This procedure takes approximately one hour. There are no restrictions for this procedure. ?Office will contact with results via phone or letter.    ? ?Follow-Up: ?6 months  ? ?Any Other Special Instructions Will Be Listed Below (If Applicable). ? ? ?If you need a refill on your cardiac medications before your next appointment, please call your pharmacy. ? ?

## 2021-10-28 NOTE — Progress Notes (Signed)
? ? ? ?Clinical Summary ?Mr. Unrein is a 76 y.o.male seen today for follow up of the following medical problems.  ? ?1. Chronic systolic HF/ICM ?- from [facility] notes 12/2019 echo showed LVEF 20-25%, new diagnosis for patient ?- at that time had significant volume overload, succesfully diuresed and started on medical therapy.  ? 02/2020 cath: occluded LAD, occluded LCX, occluded RCA. SVG-RPDA, SVG-D2/distal LAD patent, sequential graft to OM1 patent. Mean PA 20, PCWP 10, LVEDP 7, CI 2.5 ? - 03/2020 [facility]: consistent with prior scar, LVEF 30% ?  ?  ?- increase in potassium at pcp labs, aldactone was stopped ?- 01/2021 echo LVEF 40-45% ? ?  ?  ?- contacted by pcp about hyperkalemia ?- 6/16 K 5.3 7/20 K 6.1 7/22 K 5.5 ?- entresto was held. We had increased entresto to 97/'102mg'$  bid on 01/31/21 ?- in talking with him today he did not take higher entresto dose, remained on 49/'51mg'$  bid ?  ?- no recent symptoms, no SOB/DOE. No LE edema. Compliant with meds ? ? ?2. PSVT ?- noted on prior zio patch done at [facility] ?  ?-- no recent palpitations.  ?  ?  ?3. CAD ?- history of 5 vessel CABG in 1999 ?- recent cath as reported above, patent grafts.  ?  ?- no chest pains.  ?  ?4. HTN ?- compliant with meds ?  ?  ? 5. Aortic stenosis ?- 01/2021 echo mean grade 18, AVA VTI 1.02. Mild to moderate AS.  ? ?6. Hyperlipidemia ?- he ison crestor '20mg'$  daily.   ?- 10/2020 TC 151 TG 209 HDL 33 LDL 83 ?- labs followed by pcp ? ? ?Past Medical History:  ?Diagnosis Date  ? CAD (coronary artery disease)   ? Cancer [facility])   ? CHF (congestive heart failure) ([place])   ? Hypertension   ? Ischemic cardiomyopathy   ? PSVT (paroxysmal supraventricular tachycardia) ([place])   ? ? ? ?No Known Allergies ? ? ?Current Outpatient Medications  ?Medication Sig Dispense Refill  ? acetaminophen (TYLENOL) 650 MG CR tablet Take 1,300 mg by mouth every 8 (eight) hours as needed for pain.    ? Ascorbic Acid (VITAMIN C) 1000 MG tablet Take 1,000 mg by mouth daily.    ? aspirin EC 81 MG  tablet Take 1 tablet (81 mg total) by mouth daily. Swallow whole.    ? Ca Carbonate-Mag Hydroxide (ROLAIDS PO) Take 1 tablet by mouth daily as needed (indigestion).    ? Cholecalciferol (DIALYVITE VITAMIN D 5000) 125 MCG (5000 UT) capsule Take 5,000 Units by mouth daily.    ? Coenzyme Q10 (COQ10) 100 MG CAPS Take 100 mg by mouth daily.    ? Elastic Bandages & Supports (MEDICAL COMPRESSION SOCKS) MISC 1 each by Does not apply route as directed. 1 each 0  ? furosemide (LASIX) 20 MG tablet TAKE 1 TABLET BY MOUTH EVERY DAY 90 tablet 2  ? glipiZIDE (GLUCOTROL XL) 10 MG 24 hr tablet Take 10 mg by mouth daily.    ? hydrALAZINE (APRESOLINE) 25 MG tablet Take 1 tablet (25 mg total) by mouth 2 (two) times daily. 180 tablet 1  ? JARDIANCE 10 MG TABS tablet Take 10 mg by mouth daily.    ? levothyroxine (SYNTHROID) 25 MCG tablet Take 25 mcg by mouth daily.    ? Melatonin 5 MG CAPS Take 5 mg by mouth at bedtime as needed (sleep).    ? metFORMIN (GLUCOPHAGE-XR) 500 MG 24 hr tablet Take 1,000 mg by  mouth 2 (two) times daily.    ? metoprolol succinate (TOPROL-XL) 100 MG 24 hr tablet TAKE 1.5 TABLETS (150 MG TOTAL) BY MOUTH DAILY. TAKE WITH OR IMMEDIATELY FOLLOWING A MEAL. 135 tablet 3  ? rosuvastatin (CRESTOR) 20 MG tablet SMARTSIG:1 Tablet(s) By Mouth Every Evening    ? zinc gluconate 50 MG tablet Take 50 mg by mouth daily.    ? ?No current facility-administered medications for this visit.  ? ? ? ?Past Surgical History:  ?Procedure Laterality Date  ? RIGHT/LEFT HEART CATH AND CORONARY/GRAFT ANGIOGRAPHY N/A 02/08/2020  ? Procedure: RIGHT/LEFT HEART CATH AND CORONARY/GRAFT ANGIOGRAPHY;  Surgeon: Burnell Blanks, MD;  Location: [facility];  Service: Cardiovascular;  Laterality: N/A;  ? SKIN CANCER EXCISION    ? ? ? ?No Known Allergies ? ? ? ?Family History  ?Problem Relation Age of Onset  ? Heart attack Mother 2  ? Rheum arthritis Mother   ? Diabetes Father   ? Heart disease Father   ?     died after bypass surgery  ?  Cancer Brother   ?     jaw   ? Stroke Paternal Grandmother   ? ? ? ?Social History ?Mr. Muto reports that he quit smoking about 31 years ago. His smoking use included cigarettes. He has never used smokeless tobacco. ?Mr. Alcocer reports current alcohol use. ? ? ?Review of Systems ?CONSTITUTIONAL: No weight loss, fever, chills, weakness or fatigue.  ?HEENT: Eyes: No visual loss, blurred vision, double vision or yellow sclerae.No hearing loss, sneezing, congestion, runny nose or sore throat.  ?SKIN: No rash or itching.  ?CARDIOVASCULAR: per hpi ?RESPIRATORY: No shortness of breath, cough or sputum.  ?GASTROINTESTINAL: No anorexia, nausea, vomiting or diarrhea. No abdominal pain or blood.  ?GENITOURINARY: No burning on urination, no polyuria ?NEUROLOGICAL: No headache, dizziness, syncope, paralysis, ataxia, numbness or tingling in the extremities. No change in bowel or bladder control.  ?MUSCULOSKELETAL: No muscle, back pain, joint pain or stiffness.  ?LYMPHATICS: No enlarged nodes. No history of splenectomy.  ?PSYCHIATRIC: No history of depression or anxiety.  ?ENDOCRINOLOGIC: No reports of sweating, cold or heat intolerance. No polyuria or polydipsia.  ?. ? ? ?Physical Examination ?Today's Vitals  ? 10/28/21 0811  ?BP: 130/90  ?Pulse: 60  ?SpO2: 95%  ?Weight: 224 lb 9.6 oz (101.9 kg)  ?Height: 5' 9.5" (1.765 m)  ? ?Body mass index is 32.69 kg/m?. ? ?Gen: resting comfortably, no acute distress ?HEENT: no scleral icterus, pupils equal round and reactive, no palptable cervical adenopathy,  ?CV: RRR, 3/6 systolic murmur rusb, no jvd ?Resp: Clear to auscultation bilaterally ?GI: abdomen is soft, non-tender, non-distended, normal bowel sounds, no hepatosplenomegaly ?MSK: extremities are warm, no edema.  ?Skin: warm, no rash ?Neuro:  no focal deficits ?Psych: appropriate affect ? ? ?Diagnostic Studies ?Echo: 12/2019 ?Summary ?1. Technically difficult study due to body habitus. ?2. Echo contrast utilized to enhance  endocardial border definition. ?3. The left ventricle is mildly to moderately dilated in size with upper normal wall thickness. ?4. The left ventricular systolic function is severely decreased, LVEF is visually estimated at 20-25%. ?5. There is moderate mitral valve regurgitation. ?6. There is mild to moderate aortic valve stenosis. ?7. There is mild aortic regurgitation. ?8. The left atrium is moderately dilated in size. ?9. The right ventricle is normal in size, with normal systolic function. ?10. The right atrium is mildly dilated in size. ?  ?02/2020 RHC/LHC ?Prox RCA to Mid RCA lesion is 50% stenosed. ?Mid  RCA lesion is 100% stenosed. ?SVG graft was visualized by angiography and is normal in caliber. ?Ost LAD to Prox LAD lesion is 100% stenosed. ?SVG graft was visualized by angiography and is normal in caliber. ?Prox Cx lesion is 100% stenosed. ?Mid LAD to Dist LAD lesion is 50% stenosed. ?  ?1. Severe triple vessel CAD s/p 5V CABG with 5/5 patent bypass grafts. ?2. The LAD is occluded proximally. The LAD and diagonal fill from the patent vein graft. The LIMA does not appear to have been used for bypass. ?3. The Circumflex is occluded in the mid vessel. The patent sequential vein graft fills the proximal and distal OM branches ?4. The RCA is occluded in the mid to distal vessel. The distal RCA/PDA and PLA fills from left to right collaterals and from the patent vein graft ?5. Normal filling pressures. ?  ?Recommendations: Continue medical management of CAD.  ?  ?11/2019 [facility] patch: min HR 57, Max HR 255, Avg HF 101.4.8% PVCs. Rare supraventricular ectopy, some runs of atach. Short runs of NSVT ?  ?03/2020 [facility] ?IMPRESSION: ?1. Mild LV dilatation with severe systolic dysfunction (EF 02%). 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 Hypokinesis of mid ?anterior/anterolateral walls and apical anterior/septal/lateral ?walls. Akinesis of mid inferolateral wall ?  ?2. Subendocardial late gadolinium enhancement consistent with  prior ?infarcts in LAD and LCX territories. LGE is <50% transmural ?suggesting viability in mid anterior/anterolateral walls and apical ?septal/anterior/inferior/lateral walls. LGE >50% transmural ?suggesting nonviab

## 2021-11-01 DIAGNOSIS — L400 Psoriasis vulgaris: Secondary | ICD-10-CM | POA: Diagnosis not present

## 2021-11-12 DIAGNOSIS — E7800 Pure hypercholesterolemia, unspecified: Secondary | ICD-10-CM | POA: Diagnosis not present

## 2021-11-12 DIAGNOSIS — Z1329 Encounter for screening for other suspected endocrine disorder: Secondary | ICD-10-CM | POA: Diagnosis not present

## 2021-11-12 DIAGNOSIS — E1165 Type 2 diabetes mellitus with hyperglycemia: Secondary | ICD-10-CM | POA: Diagnosis not present

## 2021-11-12 DIAGNOSIS — R5382 Chronic fatigue, unspecified: Secondary | ICD-10-CM | POA: Diagnosis not present

## 2021-11-12 DIAGNOSIS — E7801 Familial hypercholesterolemia: Secondary | ICD-10-CM | POA: Diagnosis not present

## 2021-11-12 DIAGNOSIS — I10 Essential (primary) hypertension: Secondary | ICD-10-CM | POA: Diagnosis not present

## 2021-11-19 DIAGNOSIS — Z6833 Body mass index (BMI) 33.0-33.9, adult: Secondary | ICD-10-CM | POA: Diagnosis not present

## 2021-11-19 DIAGNOSIS — E039 Hypothyroidism, unspecified: Secondary | ICD-10-CM | POA: Diagnosis not present

## 2021-11-19 DIAGNOSIS — E7800 Pure hypercholesterolemia, unspecified: Secondary | ICD-10-CM | POA: Diagnosis not present

## 2021-11-19 DIAGNOSIS — I509 Heart failure, unspecified: Secondary | ICD-10-CM | POA: Diagnosis not present

## 2021-11-19 DIAGNOSIS — Z6841 Body Mass Index (BMI) 40.0 and over, adult: Secondary | ICD-10-CM | POA: Diagnosis not present

## 2021-11-19 DIAGNOSIS — E7801 Familial hypercholesterolemia: Secondary | ICD-10-CM | POA: Diagnosis not present

## 2021-11-19 DIAGNOSIS — Z23 Encounter for immunization: Secondary | ICD-10-CM | POA: Diagnosis not present

## 2021-11-19 DIAGNOSIS — E1169 Type 2 diabetes mellitus with other specified complication: Secondary | ICD-10-CM | POA: Diagnosis not present

## 2021-11-19 DIAGNOSIS — Z0001 Encounter for general adult medical examination with abnormal findings: Secondary | ICD-10-CM | POA: Diagnosis not present

## 2021-11-19 DIAGNOSIS — M5430 Sciatica, unspecified side: Secondary | ICD-10-CM | POA: Diagnosis not present

## 2021-11-19 DIAGNOSIS — D72820 Lymphocytosis (symptomatic): Secondary | ICD-10-CM | POA: Diagnosis not present

## 2021-12-03 DIAGNOSIS — E1165 Type 2 diabetes mellitus with hyperglycemia: Secondary | ICD-10-CM | POA: Diagnosis not present

## 2021-12-03 DIAGNOSIS — I10 Essential (primary) hypertension: Secondary | ICD-10-CM | POA: Diagnosis not present

## 2022-01-14 DIAGNOSIS — E7801 Familial hypercholesterolemia: Secondary | ICD-10-CM | POA: Diagnosis not present

## 2022-01-14 DIAGNOSIS — I509 Heart failure, unspecified: Secondary | ICD-10-CM | POA: Diagnosis not present

## 2022-01-14 DIAGNOSIS — E039 Hypothyroidism, unspecified: Secondary | ICD-10-CM | POA: Diagnosis not present

## 2022-01-14 DIAGNOSIS — E7800 Pure hypercholesterolemia, unspecified: Secondary | ICD-10-CM | POA: Diagnosis not present

## 2022-01-14 DIAGNOSIS — I10 Essential (primary) hypertension: Secondary | ICD-10-CM | POA: Diagnosis not present

## 2022-01-29 ENCOUNTER — Ambulatory Visit (INDEPENDENT_AMBULATORY_CARE_PROVIDER_SITE_OTHER): Payer: PPO

## 2022-01-29 DIAGNOSIS — I350 Nonrheumatic aortic (valve) stenosis: Secondary | ICD-10-CM | POA: Diagnosis not present

## 2022-01-29 LAB — ECHOCARDIOGRAM COMPLETE
AR max vel: 0.99 cm2
AV Area VTI: 0.87 cm2
AV Area mean vel: 0.86 cm2
AV Mean grad: 20 mmHg
AV Peak grad: 30.1 mmHg
AV Vena cont: 0.31 cm
Ao pk vel: 2.74 m/s
Area-P 1/2: 3.19 cm2
Calc EF: 40.6 %
MV M vel: 5.26 m/s
MV Peak grad: 110.7 mmHg
P 1/2 time: 416 msec
S' Lateral: 4.35 cm
Single Plane A2C EF: 38.2 %
Single Plane A4C EF: 43.6 %

## 2022-02-06 ENCOUNTER — Other Ambulatory Visit: Payer: Self-pay | Admitting: Cardiology

## 2022-02-10 ENCOUNTER — Other Ambulatory Visit: Payer: Self-pay | Admitting: *Deleted

## 2022-02-10 MED ORDER — METOPROLOL SUCCINATE ER 100 MG PO TB24: 150.0000 mg | ORAL_TABLET | Freq: Every day | ORAL | 1 refills | Status: DC

## 2022-02-12 IMAGING — MR MR CARD MORPHOLOGY WO/W CM
45 of 48 series · 45 of 48 positions shown · IV contrast (gadavist)
Comparison: none

CLINICAL DATA: Cardiomyopathy evaluation

EXAM:
CARDIAC MRI
TECHNIQUE: The patient was scanned on a 1.5 Tesla Siemens magnet. A dedicated
cardiac coil was used. Functional imaging was done using [employer]iest[employer]
sequences. [DATE], and 4 chamber views were done to assess for RWMA's.
Modified Nahun rule using a short axis stack was used to
calculate an ejection fraction on a dedicated work station using
[employer] software. The patient received 10 cc of Gadavist. After 10
minutes inversion recovery sequences were used to assess for
infiltration and scar tissue.
CONTRAST:  10 cc  of Gadavist

[Series 4: [id number] · axial · 8.0mm · 1.56mm/px · 1 of 20 slices shown]
[im 1/20]
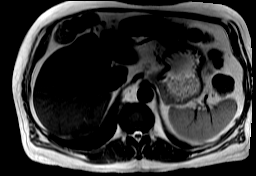

[Series 8: bSSFP · oblique · 8.0mm · 1.79mm/px · 1 of 25 slices shown (1 of 24)]
[im 1/25]
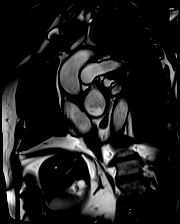

[Series 9: bSSFP · oblique · 8.0mm · 1.79mm/px · 1 of 25 slices shown (2 of 24)]
[im 1/25]
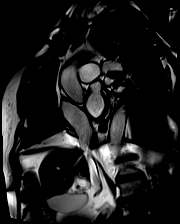

[Series 10: bSSFP · oblique · 8.0mm · 1.79mm/px · 1 of 25 slices shown (3 of 24)]
[im 1/25]
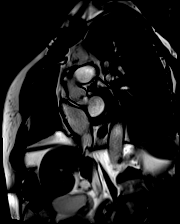

[Series 11: bSSFP · oblique · 8.0mm · 1.79mm/px · 1 of 25 slices shown (4 of 24)]
[im 1/25]
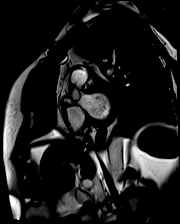

[Series 12: bSSFP · oblique · 8.0mm · 1.79mm/px · 1 of 25 slices shown (5 of 24)]
[im 1/25]
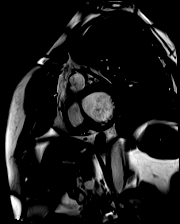

[Series 13: bSSFP · oblique · 8.0mm · 1.79mm/px · 1 of 25 slices shown (6 of 24)]
[im 1/25]
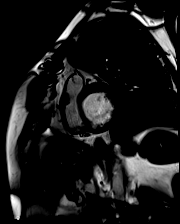

[Series 14: bSSFP · oblique · 8.0mm · 1.79mm/px · 1 of 25 slices shown (7 of 24)]
[im 1/25]
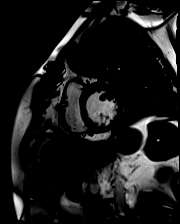

[Series 15: bSSFP · oblique · 8.0mm · 1.79mm/px · 1 of 25 slices shown (8 of 24)]
[im 1/25]
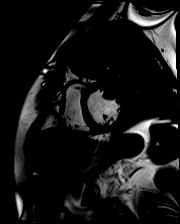

[Series 16: bSSFP · oblique · 8.0mm · 1.79mm/px · 1 of 25 slices shown (9 of 24)]
[im 1/25]
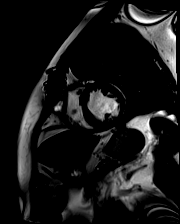

[Series 17: bSSFP · oblique · 8.0mm · 1.79mm/px · 1 of 25 slices shown (10 of 24)]
[im 1/25]
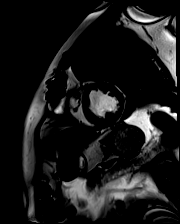

[Series 18: bSSFP · oblique · 8.0mm · 1.79mm/px · 1 of 25 slices shown (11 of 24)]
[im 1/25]
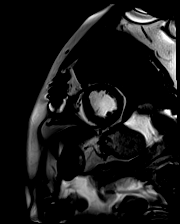

[Series 19: bSSFP · oblique · 8.0mm · 1.79mm/px · 1 of 25 slices shown (12 of 24)]
[im 1/25]
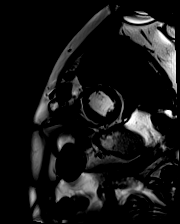

[Series 20: bSSFP · oblique · 8.0mm · 1.79mm/px · 1 of 25 slices shown (13 of 24)]
[im 1/25]
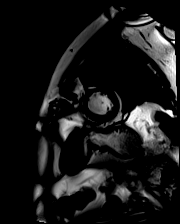

[Series 21: bSSFP · oblique · 8.0mm · 1.79mm/px · 1 of 25 slices shown (14 of 24)]
[im 1/25]
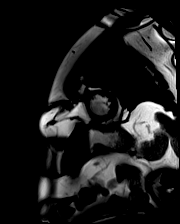

[Series 22: bSSFP · oblique · 8.0mm · 1.79mm/px · 1 of 25 slices shown (15 of 24)]
[im 1/25]
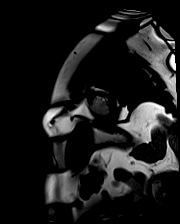

[Series 23: bSSFP · oblique · 8.0mm · 1.79mm/px · 1 of 25 slices shown (16 of 24)]
[im 1/25]
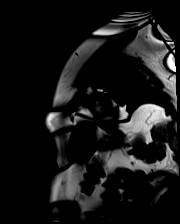

[Series 24: bSSFP · oblique · 8.0mm · 1.79mm/px · 1 of 25 slices shown (17 of 24)]
[im 1/25]
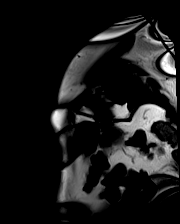

[Series 25: bSSFP · oblique · 8.0mm · 1.79mm/px · 1 of 25 slices shown (18 of 24)]
[im 1/25]
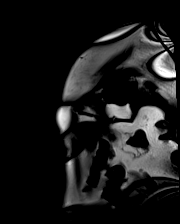

[Series 26: bSSFP · oblique · 8.0mm · 1.79mm/px · 1 of 25 slices shown (19 of 24)]
[im 1/25]
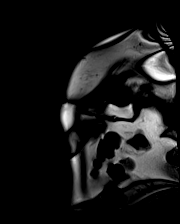

[Series 27: bSSFP · oblique · 8.0mm · 1.79mm/px · 1 of 25 slices shown (20 of 24)]
[im 1/25]
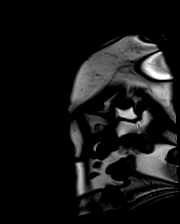

[Series 28: bSSFP · oblique · 6.0mm · 1.41mm/px · 1 of 25 slices shown (21 of 24)]
[im 1/25]
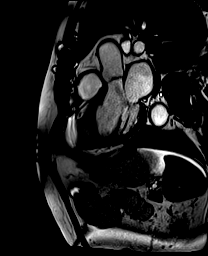

[Series 29: bSSFP · oblique · 6.0mm · 1.41mm/px · 1 of 25 slices shown (22 of 24)]
[im 1/25]
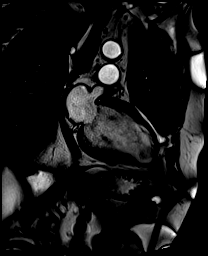

[Series 30: bSSFP · axial · 6.0mm · 1.41mm/px · 1 of 25 slices shown (23 of 24)]
[im 1/25]
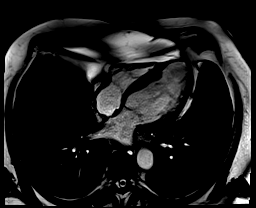

[Series 31: (id)_long_t1 · oblique · 8.0mm · 1.98mm/px · 1 of 24 slices shown]
[im 1/24]
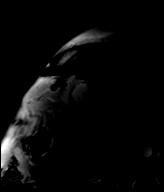

[Series 32: (id)_long_t1_moco · oblique · 8.0mm · 1.98mm/px · 1 of 24 slices shown]
[im 1/24]
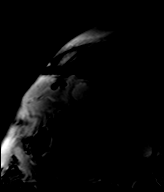

[Series 35: (id)_trufi · oblique · 8.0mm · 1.88mm/px · 1 of 9 slices shown]
[im 1/9]
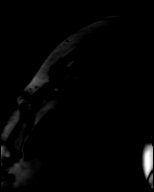

[Series 36: (id)_trufi_moco · oblique · 8.0mm · 1.88mm/px · 1 of 9 slices shown]
[im 1/9]
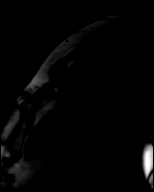

[Series 39: bSSFP · coronal · 6.0mm · 1.41mm/px · 1 of 25 slices shown (24 of 24)]
[im 1/25]
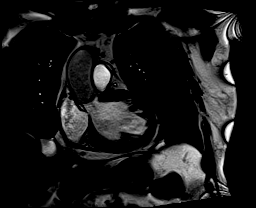

[Series 40: cine rvot · sagittal · 6.0mm · 1.41mm/px · 1 of 25 slices shown]
[im 1/25]
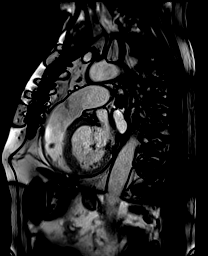

[Series 41: cor rvot · oblique · 6.0mm · 1.41mm/px · 1 of 25 slices shown]
[im 1/25]
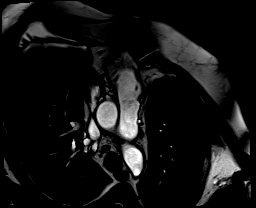

[Series 42: aortic valve cine · oblique · 6.0mm · 1.41mm/px · 1 of 25 slices shown]
[im 1/25]
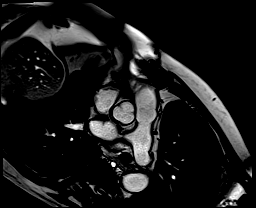

[Series 43: aortic valve flow · axial · 6.0mm · 1.73mm/px · 1 of 30 slices shown (1 of 6)]
[im 1/30]
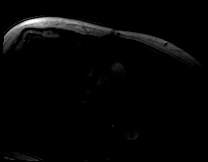

[Series 44: aortic valve flow · axial · 6.0mm · 1.73mm/px · 1 of 30 slices shown (2 of 6)]
[im 1/30]
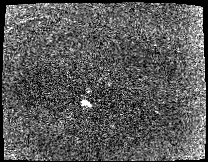

[Series 45: aortic valve flow · axial · 6.0mm · 1.73mm/px · 1 of 30 slices shown (3 of 6)]
[im 1/30]
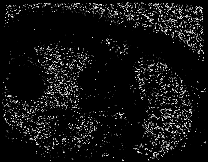

[Series 46: pulm valve flow · oblique · 6.0mm · 1.73mm/px · 1 of 30 slices shown (1 of 3)]
[im 1/30]
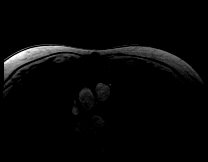

[Series 47: pulm valve flow · oblique · 6.0mm · 1.73mm/px · 1 of 30 slices shown (2 of 3)]
[im 1/30]
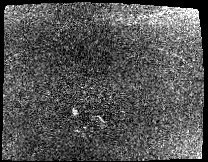

[Series 48: pulm valve flow · oblique · 6.0mm · 1.73mm/px · 1 of 30 slices shown (3 of 3)]
[im 1/30]
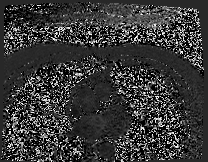

[Series 49: aortic valve flow · axial · 6.0mm · 1.73mm/px · 1 of 30 slices shown (4 of 6)]
[im 1/30]
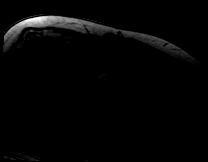

[Series 50: aortic valve flow · axial · 6.0mm · 1.73mm/px · 1 of 28 slices shown (5 of 6)]
[im 1/28]
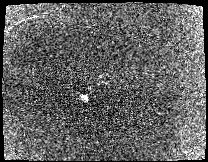

[Series 51: aortic valve flow · axial · 6.0mm · 1.73mm/px · 1 of 30 slices shown (6 of 6)]
[im 1/30]
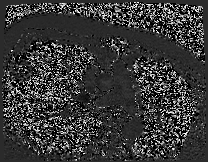

[Series 53: lge_single shot sa · oblique · 8.0mm · 1.98mm/px · 1 of 18 slices shown (1 of 2)]
[im 1/18]
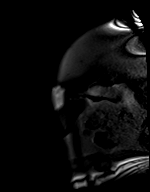

[Series 54: lge_single shot sa · oblique · 8.0mm · 1.98mm/px · 1 of 18 slices shown (2 of 2)]
[im 1/18]
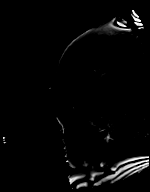

[Series 57: lge_single shot 4 · axial · 6.0mm · 1.98mm/px · 1 of 1 slices shown (1 of 2)]
[im 1/1]
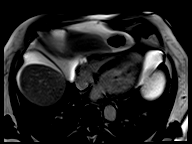

[Series 58: lge_single shot 4 · axial · 6.0mm · 1.98mm/px · 1 of 1 slices shown (2 of 2)]
[im 1/1]
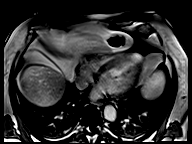

[45 of 48 positions shown; findings below may reference images not displayed]

FINDINGS: Left ventricle:

-Mild dilatation

-Severe systolic dysfunction. Hypokinesis of mid
anterior/anterolateral, apical anterior/septal/lateral. Mid
inferolateral akinesis. Apical aneurysm.

-Normal ECV 28%

-Subendocardial LGE with <50% transmurality suggesting viability in
mid anterior/anterolateral/inferolateral, apical
septal/anterior/inferior/lateral. Subendocardial LGE with >50%
transmurality suggesting nonviability at apex. Mid inferolateral
wall thickness 4mm, suggesting nonviability

LV EF: 26% (Normal 56-78%)

Absolute volumes:

LV EDV: 219mL (Normal 77-195 mL)

LV ESV: 162mL (Normal 19-72 mL)

LV SV: 57mL (Normal 51-133 mL)

CO: 5.2L/min (Normal 2.8-8.8 L/min)

Indexed volumes:

LV EDV: 102mL/sq-m (Normal 47-92 mL/sq-m)

LV ESV: 75mL/sq-m (Normal 13-30 mL/sq-m)

LV SV: 26mL/sq-m (Normal 32-62 mL/sq-m)

CI: 2.4L/min/sq-m (Normal 1.7-4.2 L/min/sq-m)

Right ventricle: Normal size and systolic function

RV EF:  50% (Normal 47-74%)

Absolute volumes:

RV EDV: 106mL (Normal 88-227 mL)

RV ESV: 52mL (Normal 23-103 mL)

RV SV: 53mL (Normal 52-138 mL)

CO: 4.9L/min (Normal 2.8-8.8 L/min)

Indexed volumes:

RV EDV: 49mL/sq-m (Normal 55-105 mL/sq-m)

RV ESV: 24mL/sq-m (Normal 15-43 mL/sq-m)

RV SV: 25mL/sq-m (Normal 32-64 mL/sq-m)

CI: 2.3L/min/sq-m (Normal 1.7-4.2 L/min/sq-m)

Left atrium: Mild enlargement

Right atrium: Normal size

Mitral valve: Mild regurgitation (regurgitant fraction 19%)

Aortic valve: Trivial regurgitation

Tricuspid valve: No regurgitation

Pulmonic valve: No regurgitation

Aorta: Mild dilatation of ascending aorta measuring up to 38mm

Pericardium: Normal
IMPRESSION: 1. Mild LV dilatation with severe systolic dysfunction (EF 26%).
There is an apical aneurysm. Hypokinesis of mid
anterior/anterolateral walls and apical anterior/septal/lateral
walls. Akinesis of mid inferolateral wall

2. Subendocardial late gadolinium enhancement consistent with prior
infarcts in LAD and LCX territories. LGE is <50% transmural
suggesting viability in mid anterior/anterolateral walls and apical
septal/anterior/inferior/lateral walls. LGE >50% transmural
suggesting nonviability at apex. In addition, there is significant
thinning in mid inferolateral wall (wall thickness 4mm), suggesting
nonviability.

3.  Normal RV size and systolic function (EF 50%)

4.  Mild mitral regurgitation (regurgitant fraction 19%)

## 2022-02-14 ENCOUNTER — Telehealth: Payer: Self-pay | Admitting: *Deleted

## 2022-02-14 DIAGNOSIS — R931 Abnormal findings on diagnostic imaging of heart and coronary circulation: Secondary | ICD-10-CM

## 2022-02-14 NOTE — Telephone Encounter (Signed)
Laurine Blazer, LPN  1/88/6773  7:36 PM EDT Back to Top    Notified, copy to pcp.  Agrees to doing the limited echo.  Order entered & sent to [facility] for scheduling

## 2022-02-14 NOTE — Telephone Encounter (Signed)
-----   Message from Bernita Raisin, RN sent at 02/10/2022  9:19 AM EDT -----  ----- Message ----- From: Arnoldo Lenis, MD Sent: 02/09/2022  12:29 PM EDT To: Berlinda Last, CMA  Echo shows heart pumping funciton remains mildly decreased around 40-45% (normal is 50-60%). Some limited visualization at the tip of the heart, needs a limited echo with contrast to better evaluate potential apical thrombus  Zandra Abts MD

## 2022-02-17 ENCOUNTER — Telehealth: Payer: Self-pay | Admitting: Cardiology

## 2022-02-17 DIAGNOSIS — E1169 Type 2 diabetes mellitus with other specified complication: Secondary | ICD-10-CM | POA: Diagnosis not present

## 2022-02-17 DIAGNOSIS — D72820 Lymphocytosis (symptomatic): Secondary | ICD-10-CM | POA: Diagnosis not present

## 2022-02-17 DIAGNOSIS — E039 Hypothyroidism, unspecified: Secondary | ICD-10-CM | POA: Diagnosis not present

## 2022-02-17 DIAGNOSIS — I509 Heart failure, unspecified: Secondary | ICD-10-CM | POA: Diagnosis not present

## 2022-02-17 DIAGNOSIS — E875 Hyperkalemia: Secondary | ICD-10-CM | POA: Diagnosis not present

## 2022-02-17 DIAGNOSIS — Z6833 Body mass index (BMI) 33.0-33.9, adult: Secondary | ICD-10-CM | POA: Diagnosis not present

## 2022-02-17 DIAGNOSIS — E7801 Familial hypercholesterolemia: Secondary | ICD-10-CM | POA: Diagnosis not present

## 2022-02-17 DIAGNOSIS — R030 Elevated blood-pressure reading, without diagnosis of hypertension: Secondary | ICD-10-CM | POA: Diagnosis not present

## 2022-02-17 NOTE — Telephone Encounter (Signed)
Checking percert on the following patient for testing scheduled at [facility]    LIMITED ECHO WITH CONTRAST   02/27/2022

## 2022-02-20 ENCOUNTER — Ambulatory Visit: Payer: PPO | Admitting: Orthopedic Surgery

## 2022-02-20 ENCOUNTER — Ambulatory Visit (INDEPENDENT_AMBULATORY_CARE_PROVIDER_SITE_OTHER): Payer: PPO

## 2022-02-20 ENCOUNTER — Encounter: Payer: Self-pay | Admitting: Orthopedic Surgery

## 2022-02-20 VITALS — BP 149/79 | HR 82 | Ht 69.5 in | Wt 225.0 lb

## 2022-02-20 DIAGNOSIS — M8440XA Pathological fracture, unspecified site, initial encounter for fracture: Secondary | ICD-10-CM | POA: Diagnosis not present

## 2022-02-20 DIAGNOSIS — M25562 Pain in left knee: Secondary | ICD-10-CM

## 2022-02-20 DIAGNOSIS — M1712 Unilateral primary osteoarthritis, left knee: Secondary | ICD-10-CM

## 2022-02-20 MED ORDER — METHYLPREDNISOLONE ACETATE 40 MG/ML IJ SUSP
40.0000 mg | Freq: Once | INTRAMUSCULAR | Status: AC
  Administered 2022-02-20: 40 mg via INTRA_ARTICULAR

## 2022-02-20 NOTE — Progress Notes (Addendum)
Chief Complaint  Patient presents with   Knee Pain    Left since 02/10/22    HPI: 76 year old male presents with acute onset of left knee pain  This 76 year old male who is here with his wife who is helping with the history of his knee.  She says he has had problems with the knee for a long time he is hopped around when it bothers him he uses a cane on occasion.  However he was in his usual state of health until he helped another relative may be his son load some cinderblocks, the next morning he could not stand or bear weight he had pain and swelling he went back into a knee brace he got from [facility] and started using crutches he comes in partial weightbearing complaining of medial knee discomfort when he is weightbearing no pain at rest  Most of the pain is on the medial side of the joint  Past Medical History:  Diagnosis Date   CAD (coronary artery disease)    Cancer (HCC)    CHF (congestive heart failure) (HCC)    Hypertension    Ischemic cardiomyopathy    PSVT (paroxysmal supraventricular tachycardia) (HCC)     BP (!) 149/79   Pulse 82   Ht 5' 9.5" (1.765 m)   Wt 225 lb (102.1 kg)   BMI 32.75 kg/m    General appearance: Well-developed well-nourished no gross deformities  Cardiovascular normal pulse and perfusion normal color without edema  Neurologically no sensation loss or deficits or pathologic reflexes  Psychological: Awake alert and oriented x3 mood and affect normal  Skin no lacerations or ulcerations no nodularity no palpable masses, no erythema or nodularity  Musculoskeletal: Gait as described  Varus alignment left knee skin normal tenderness medial femoral condyle nontender joint line and tibial plateau small effusion noted ligaments all stable  Imaging internal imaging: See report: Patient has osteoarthritis of the left knee with narrowing of the medial compartment he has khellin criteria grade 2 arthritis.  A/P  Encounter Diagnoses  Name  Primary?   Acute pain of left knee Yes   Primary osteoarthritis of left knee    Insufficiency fracture left distal femur med fem condyle      Most likely has an insufficiency stress type fracture of the left distal femoral medial femoral condyle perhaps meniscal tear along with his arthritis  Recommend protected weightbearing in a new brace economy hinged brace with crutches for 6 weeks and then reevaluate.  May need MRI  Injection for pain relief  Procedure note left knee injection   verbal consent was obtained to inject left knee joint  Timeout was completed to confirm the site of injection  The medications used were depomedrol 40 mg and 1% lidocaine 3 cc Anesthesia was provided by ethyl chloride and the skin was prepped with alcohol.  After cleaning the skin with alcohol a 20-gauge needle was used to inject the left knee joint. There were no complications. A sterile bandage was applied.

## 2022-02-26 MED ORDER — METHYLPREDNISOLONE ACETATE 40 MG/ML IJ SUSP
40.0000 mg | Freq: Once | INTRAMUSCULAR | Status: AC
  Administered 2022-02-20: 40 mg via INTRA_ARTICULAR

## 2022-02-26 NOTE — Addendum Note (Signed)
Addended byCandice Camp on: 02/26/2022 11:45 AM   Modules accepted: Orders

## 2022-02-27 ENCOUNTER — Ambulatory Visit (HOSPITAL_COMMUNITY)
Admission: RE | Admit: 2022-02-27 | Discharge: 2022-02-27 | Disposition: A | Discharge status: Home / Self Care | Payer: PPO | Source: Ambulatory Visit | Attending: Cardiology | Admitting: Cardiology

## 2022-02-27 DIAGNOSIS — I10 Essential (primary) hypertension: Secondary | ICD-10-CM

## 2022-02-27 DIAGNOSIS — R931 Abnormal findings on diagnostic imaging of heart and coronary circulation: Secondary | ICD-10-CM

## 2022-02-27 MED ORDER — PERFLUTREN LIPID MICROSPHERE
1.0000 mL | INTRAVENOUS | Status: AC | PRN
  Administered 2022-02-27: 3 mL via INTRAVENOUS

## 2022-02-27 NOTE — Progress Notes (Signed)
*  PRELIMINARY RESULTS* Echocardiogram Limited 2-D Echocardiogram  has been performed with [facility].  Kenneth Randall 02/27/2022, 1:37 PM

## 2022-03-18 DIAGNOSIS — E039 Hypothyroidism, unspecified: Secondary | ICD-10-CM | POA: Diagnosis not present

## 2022-03-20 ENCOUNTER — Encounter: Payer: Self-pay | Admitting: Orthopedic Surgery

## 2022-03-20 ENCOUNTER — Ambulatory Visit: Payer: PPO | Admitting: Orthopedic Surgery

## 2022-03-20 ENCOUNTER — Other Ambulatory Visit: Payer: Self-pay | Admitting: Cardiology

## 2022-03-20 DIAGNOSIS — M1712 Unilateral primary osteoarthritis, left knee: Secondary | ICD-10-CM

## 2022-03-20 DIAGNOSIS — M8440XA Pathological fracture, unspecified site, initial encounter for fracture: Secondary | ICD-10-CM

## 2022-03-20 DIAGNOSIS — M25562 Pain in left knee: Secondary | ICD-10-CM | POA: Diagnosis not present

## 2022-03-20 NOTE — Progress Notes (Signed)
Chief Complaint  Patient presents with   Knee Pain    LT knee 4 wk follow up//knee is about the same    Encounter Diagnoses  Name Primary?   Acute pain of left knee Yes   Primary osteoarthritis of left knee    Insufficiency fracture left distal femur med fem condyle      Kenneth Randall is here today 4 weeks after bracing and and crutches for his insufficiency fracture arthritis and acute pain left knee  His overall pain is better but he says anytime he pivots and twists on the left knee he has increased and severe pain on the medial joint  Exam reveals he does have less pain over the femur and tibia but he has a positive McMurray's sign and tenderness over the medial joint line with effusion  I think he should get the MRI to rule out the meniscal tear as a confounding variable continue his protected weightbearing and follow-up after his MRI he should continue bracing and supportive gait  Encounter Diagnoses  Name Primary?   Acute pain of left knee Yes   Primary osteoarthritis of left knee    Insufficiency fracture left distal femur med fem condyle

## 2022-03-20 NOTE — Patient Instructions (Signed)
While we are working on your approval for MRI please go ahead and call to schedule your appointment with [facility] within at least one (1) week.   Central Scheduling [phone]  

## 2022-04-08 ENCOUNTER — Ambulatory Visit (HOSPITAL_COMMUNITY)
Admission: RE | Admit: 2022-04-08 | Discharge: 2022-04-08 | Disposition: A | Discharge status: Home / Self Care | Payer: PPO | Source: Ambulatory Visit | Attending: Orthopedic Surgery | Admitting: Orthopedic Surgery

## 2022-04-08 DIAGNOSIS — M25562 Pain in left knee: Secondary | ICD-10-CM | POA: Insufficient documentation

## 2022-04-11 ENCOUNTER — Ambulatory Visit: Payer: PPO | Admitting: Orthopedic Surgery

## 2022-04-11 ENCOUNTER — Encounter: Payer: Self-pay | Admitting: Orthopedic Surgery

## 2022-04-11 DIAGNOSIS — S83242A Other tear of medial meniscus, current injury, left knee, initial encounter: Secondary | ICD-10-CM | POA: Diagnosis not present

## 2022-04-11 DIAGNOSIS — M1712 Unilateral primary osteoarthritis, left knee: Secondary | ICD-10-CM | POA: Diagnosis not present

## 2022-04-11 DIAGNOSIS — S83209A Unspecified tear of unspecified meniscus, current injury, unspecified knee, initial encounter: Secondary | ICD-10-CM

## 2022-04-11 NOTE — Progress Notes (Signed)
Chief Complaint  Patient presents with   Knee Pain    LT Review MRI   76 year old male had some knee pain off-and-on intermittently limped here and there used a cane at times but was in a reasonable state of health in terms of his left knee until he was lifting some cinderblocks and then had a change in the function and ability to ambulate.  He has improved slightly with crutches and bracing  We also felt that he had an insufficiency fracture of the medial femoral condyle  I reviewed his MRI  He has some loose fragments of cartilage and may be an osteochondral fragment along with osteoarthritis and torn medial meniscus  We discussed the options of knee replacement versus arthroscopy  We have to factor in his coronary artery disease recent cath in 2021 which she says was stable and he was functioning very well  I still would like to get cardiology involved and have sent Dr. Harl Bowie note  He will think about which surgery he wants to do we will get cardiology's input  He will continue on his crutches and bracing for right now He is not anywhere near where we want him to be because he has not returned to his previous level of function prior to the episode of lifting the cinderblocks

## 2022-04-30 ENCOUNTER — Encounter: Payer: Self-pay | Admitting: Cardiology

## 2022-04-30 ENCOUNTER — Ambulatory Visit: Payer: PPO | Attending: Cardiology | Admitting: Cardiology

## 2022-04-30 VITALS — BP 128/70 | HR 84 | Ht 69.5 in | Wt 223.4 lb

## 2022-04-30 DIAGNOSIS — I350 Nonrheumatic aortic (valve) stenosis: Secondary | ICD-10-CM

## 2022-04-30 DIAGNOSIS — I10 Essential (primary) hypertension: Secondary | ICD-10-CM | POA: Diagnosis not present

## 2022-04-30 DIAGNOSIS — I5022 Chronic systolic (congestive) heart failure: Secondary | ICD-10-CM | POA: Diagnosis not present

## 2022-04-30 DIAGNOSIS — I2510 Atherosclerotic heart disease of native coronary artery without angina pectoris: Secondary | ICD-10-CM

## 2022-04-30 DIAGNOSIS — E782 Mixed hyperlipidemia: Secondary | ICD-10-CM | POA: Diagnosis not present

## 2022-04-30 MED ORDER — FUROSEMIDE 20 MG PO TABS: 20.0000 mg | ORAL_TABLET | Freq: Every day | ORAL | 2 refills | Status: DC

## 2022-04-30 NOTE — Patient Instructions (Signed)
Medication Instructions:  Your physician has recommended you make the following change in your medication:  Take lasix 20 mg once a day, may take additional 20 mg as needed for severe swelling Continue all other medications as directed  Labwork: none  Testing/Procedures: none  Follow-Up:  Your physician recommends that you schedule a follow-up appointment in: 6 months  Any Other Special Instructions Will Be Listed Below (If Applicable).  If you need a refill on your cardiac medications before your next appointment, please call your pharmacy.

## 2022-04-30 NOTE — Progress Notes (Signed)
Clinical Summary Mr. Garber is a 76 y.o.male seen today for follow up of the following medical problems.    1. Chronic systolic HF/ICM - from [facility] notes 12/2019 echo showed LVEF 20-25%, new diagnosis for patient - at that time had significant volume overload, succesfully diuresed and started on medical therapy.   02/2020 cath: occluded LAD, occluded LCX, occluded RCA. SVG-RPDA, SVG-D2/distal LAD patent, sequential graft to OM1 patent. Mean PA 20, PCWP 10, LVEDP 7, CI 2.5  - 03/2020 CMRI: consistent with prior scar, LVEF 30%  - 01/2021 echo LVEF 40-45% -hyperkalemia on entresto alone, also with aldactone alone.    02/2022 echo: LVEF 40-45%, apex akinetic  - no chest pains, no SOB/DOE. Occasional LE edema.     2. PSVT - noted on prior zio patch done at [facility]   --denies any palpitations     3. CAD - history of 5 vessel CABG in 1999 - recent cath as reported above, patent grafts.    -no recent chest pains.    4. HTN - compliant with meds      5. Aortic stenosis - 01/2021 echo mean grade 18, AVA VTI 1.02. Mild to moderate AS.   - 01/2022 echo: mod AS mean grad 20, AVA VTI  0.87, DI 0.31, SVI 25    6. Hyperlipidemia - he ison crestor '20mg'$  daily.   - 10/2020 TC 151 TG 209 HDL 33 LDL 83 - labs followed by pcp   7. Preop evaluation - considering knee replacement - limited with knee, uses crutches to get around. Can walk up flight of stairs without significant symptoms.    Past Medical History:  Diagnosis Date   CAD (coronary artery disease)    Cancer (HCC)    CHF (congestive heart failure) (HCC)    Hypertension    Ischemic cardiomyopathy    PSVT (paroxysmal supraventricular tachycardia) (HCC)      No Known Allergies   Current Outpatient Medications  Medication Sig Dispense Refill   acetaminophen (TYLENOL) 650 MG CR tablet Take 1,300 mg by mouth every 8 (eight) hours as needed for pain.     aspirin EC 81 MG tablet Take 1 tablet (81 mg total) by mouth daily.  Swallow whole.     Coenzyme Q10 (COQ10) 100 MG CAPS Take 100 mg by mouth daily.     furosemide (LASIX) 20 MG tablet TAKE 1 TABLET BY MOUTH EVERY DAY 90 tablet 2   glipiZIDE (GLUCOTROL XL) 10 MG 24 hr tablet Take 10 mg by mouth daily.     hydrALAZINE (APRESOLINE) 25 MG tablet TAKE 1 TABLET BY MOUTH TWICE A DAY 180 tablet 1   isosorbide mononitrate (IMDUR) 30 MG 24 hr tablet Take 0.5 tablets (15 mg total) by mouth daily. 15 tablet 6   JARDIANCE 10 MG TABS tablet Take 10 mg by mouth daily.     levothyroxine (SYNTHROID) 25 MCG tablet Take 25 mcg by mouth daily.     metFORMIN (GLUCOPHAGE-XR) 500 MG 24 hr tablet Take 1,000 mg by mouth 2 (two) times daily.     metoprolol succinate (TOPROL-XL) 100 MG 24 hr tablet Take 1.5 tablets (150 mg total) by mouth daily. Take with or immediately following a meal. 135 tablet 1   rosuvastatin (CRESTOR) 20 MG tablet SMARTSIG:1 Tablet(s) By Mouth Every Evening     No current facility-administered medications for this visit.     Past Surgical History:  Procedure Laterality Date   RIGHT/LEFT HEART CATH AND CORONARY/GRAFT ANGIOGRAPHY  N/A 02/08/2020   Procedure: RIGHT/LEFT HEART CATH AND CORONARY/GRAFT ANGIOGRAPHY;  Surgeon: Burnell Blanks, MD;  Location: [facility];  Service: Cardiovascular;  Laterality: N/A;   SKIN CANCER EXCISION       No Known Allergies    Family History  Problem Relation Age of Onset   Heart attack Mother 50   Rheum arthritis Mother    Diabetes Father    Heart disease Father        died after bypass surgery   Cancer Brother        jaw    Stroke Paternal Grandmother      Social History Mr. Marsalis reports that he quit smoking about 32 years ago. His smoking use included cigarettes. He has never used smokeless tobacco. Mr. Gomes reports current alcohol use.   Review of Systems CONSTITUTIONAL: No weight loss, fever, chills, weakness or fatigue.  HEENT: Eyes: No visual loss, blurred vision, double vision or  yellow sclerae.No hearing loss, sneezing, congestion, runny nose or sore throat.  SKIN: No rash or itching.  CARDIOVASCULAR: per hpi RESPIRATORY: No shortness of breath, cough or sputum.  GASTROINTESTINAL: No anorexia, nausea, vomiting or diarrhea. No abdominal pain or blood.  GENITOURINARY: No burning on urination, no polyuria NEUROLOGICAL: No headache, dizziness, syncope, paralysis, ataxia, numbness or tingling in the extremities. No change in bowel or bladder control.  MUSCULOSKELETAL: No muscle, back pain, joint pain or stiffness.  LYMPHATICS: No enlarged nodes. No history of splenectomy.  PSYCHIATRIC: No history of depression or anxiety.  ENDOCRINOLOGIC: No reports of sweating, cold or heat intolerance. No polyuria or polydipsia.  Marland Kitchen   Physical Examination Today's Vitals   04/30/22 0807  BP: 128/70  Pulse: 84  SpO2: 97%  Weight: 223 lb 6.4 oz (101.3 kg)  Height: 5' 9.5" (1.765 m)   Body mass index is 32.52 kg/m.  Gen: resting comfortably, no acute distress HEENT: no scleral icterus, pupils equal round and reactive, no palptable cervical adenopathy,  CV: RRR, no mrg, no jvd Resp: Clear to auscultation bilaterally GI: abdomen is soft, non-tender, non-distended, normal bowel sounds, no hepatosplenomegaly MSK: extremities are warm, no edema.  Skin: warm, no rash Neuro:  no focal deficits Psych: appropriate affect   Diagnostic Studies  Echo: 12/2019 Summary 1. Technically difficult study due to body habitus. 2. Echo contrast utilized to enhance endocardial border definition. 3. The left ventricle is mildly to moderately dilated in size with upper normal wall thickness. 4. The left ventricular systolic function is severely decreased, LVEF is visually estimated at 20-25%. 5. There is moderate mitral valve regurgitation. 6. There is mild to moderate aortic valve stenosis. 7. There is mild aortic regurgitation. 8. The left atrium is moderately dilated in size. 9. The right  ventricle is normal in size, with normal systolic function. 10. The right atrium is mildly dilated in size.   02/2020 RHC/LHC Prox RCA to Mid RCA lesion is 50% stenosed. Mid RCA lesion is 100% stenosed. SVG graft was visualized by angiography and is normal in caliber. Ost LAD to Prox LAD lesion is 100% stenosed. SVG graft was visualized by angiography and is normal in caliber. Prox Cx lesion is 100% stenosed. Mid LAD to Dist LAD lesion is 50% stenosed.   1. Severe triple vessel CAD s/p 5V CABG with 5/5 patent bypass grafts. 2. The LAD is occluded proximally. The LAD and diagonal fill from the patent vein graft. The LIMA does not appear to have been used for bypass. 3. The  Circumflex is occluded in the mid vessel. The patent sequential vein graft fills the proximal and distal OM branches 4. The RCA is occluded in the mid to distal vessel. The distal RCA/PDA and PLA fills from left to right collaterals and from the patent vein graft 5. Normal filling pressures.   Recommendations: Continue medical management of CAD.    11/2019 [facility] Zio patch: min HR 57, Max HR 255, Avg HF 101.4.8% PVCs. Rare supraventricular ectopy, some runs of atach. Short runs of NSVT   03/2020 CMRI IMPRESSION: 1. Mild LV dilatation with severe systolic dysfunction (EF 96%). 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 Hypokinesis of mid anterior/anterolateral walls and apical anterior/septal/lateral walls. Akinesis of mid inferolateral wall   2. Subendocardial late gadolinium enhancement consistent with prior infarcts in LAD and LCX territories. LGE is <50% transmural suggesting viability in mid anterior/anterolateral walls and apical septal/anterior/inferior/lateral walls. LGE >50% transmural suggesting nonviability at apex. In addition, there is significant thinning in mid inferolateral wall (wall thickness 75m), suggesting nonviability.   3.  Normal RV size and systolic function (EF 528%   4.  Mild mitral regurgitation  (regurgitant fraction 19%)     01/2021 echo 1. Left ventricular ejection fraction, by estimation, is 40 to 45%. The  left ventricle has mildly decreased function. The left ventricle  demonstrates regional wall motion abnormalities (see scoring  diagram/findings for description). The left ventricular   internal cavity size was mildly to moderately dilated. Left ventricular  diastolic parameters are consistent with Grade I diastolic dysfunction  (impaired relaxation). Reduced global longitudinal strain of -10.3%. Would  consider future studies with  Definity contrast.   2. Right ventricular systolic function is normal. The right ventricular  size is normal. Tricuspid regurgitation signal is inadequate for assessing  PA pressure.   3. Left atrial size was mildly dilated.   4. The mitral valve is grossly normal. Mild to moderate mitral valve  regurgitation.   5. The aortic valve has an indeterminant number of cusps. There is  moderate calcification of the aortic valve. Aortic valve regurgitation is  mild. Moderate aortic valve stenosis. Aortic regurgitation PHT measures  758 msec. Aortic valve mean gradient  measures 18.0 mmHg. Dimentionless index 0.36.   6. The inferior vena cava is normal in size with greater than 50%  respiratory variability, suggesting right atrial pressure of 3 mmHg.    01/2022 echo  1. Acoustic shadowing at LV apex - cannot exclude relatively large,  rounded mural thormbus. Suggest Definity contrast study to clarify.   2. Left ventricular ejection fraction, by estimation, is 40 to 45%. The  left ventricle has mildly decreased function. The left ventricle  demonstrates regional wall motion abnormalities (see scoring  diagram/findings for description). The left ventricular   internal cavity size was mildly dilated. Left ventricular diastolic  parameters are consistent with Grade II diastolic dysfunction  (pseudonormalization). Elevated left ventricular end-diastolic  pressure.  The average left ventricular global longitudinal  strain is -11.7 %. The global longitudinal strain is abnormal.   3. Right ventricular systolic function is normal. The right ventricular  size is normal. The estimated right ventricular systolic pressure is 136.6 mmHg.   4. Left atrial size was mildly dilated.   5. The mitral valve is degenerative. Mild to moderate mitral valve  regurgitation.   6. The aortic valve is tricuspid. There is moderate calcification of the  aortic valve. Aortic valve regurgitation is mild. Moderate aortic valve  stenosis. Aortic regurgitation PHT measures 416 msec. Aortic  valve mean  gradient measures 20.0 mmHg.  Dimentionless index 0.31.   7. The inferior vena cava is normal in size with greater than 50%  respiratory variability, suggesting right atrial pressure of 3 mmHg.    02/2022 echo 1. Left ventricular ejection fraction, by estimation, is 40 to 45%. The  left ventricle has mildly decreased function. The left ventricle  demonstrates regional wall motion abnormalities (see scoring  diagram/findings for description).   2. No apical thrombus seen    Assessment and Plan   1. Chronic systolic HF - new diagnosis based on 12/2019 echo at [facility], LVEF 20-25% - cath with patent grafts, normal filling pressures  - repeat echo shows LVEF is improving, now 40-45% - hyperkalemia on aldactone, then recurrent hyperkalemia on entresto. Off both medications, at this time avoiding ACE/ARB/ARNI/MRAs - no recent symptoms, we will continue current meds. He is euvolemic today.    2. CAD - no recent symptoms, 2021 cath with patent grafts -continue current meds - EKG today shows SR, no specific ischemic changes   3. Aortic stenosis - moderate by recent echo,repeat echo next year   4. HTN -at goal, continue current meds   5. Hyperlipidemia - request pcp labs, continue crestor  6. Preoperative evaluation - considering knee surgery - chronic cardiac  conditions are stable. Recent LVEF 40-45%, cath 2021 patent grafts. Tolerates greater than 4 METs without symptoms. From cardiac standpoint no limitation on considering knee surgery, possibly replacement.     Arnoldo Lenis, M.D.

## 2022-05-02 ENCOUNTER — Telehealth: Payer: Self-pay | Admitting: Radiology

## 2022-05-02 NOTE — Telephone Encounter (Signed)
-----   Message from Carole Civil, MD sent at 05/01/2022  8:46 PM EDT ----- OK GREAT WILL GET ON THE [place] ... TY ----- Message ----- From: Arnoldo Lenis, MD Sent: 04/30/2022   8:47 AM EDT To: Carole Civil, MD  Dorothyann Peng,  [place] patient. Chronic cardiac conditions are stable, from cardiac standpoint would be ok for any type of knee procedure you guys decide to proceed with   Carlyle Dolly MD

## 2022-05-02 NOTE — Telephone Encounter (Signed)
Have put this in the chart, Dr Harl Bowie has cleared for surgery. Patient is deciding if he wants scope or total

## 2022-05-13 ENCOUNTER — Other Ambulatory Visit: Payer: Self-pay | Admitting: Cardiology

## 2022-05-21 DIAGNOSIS — Z6832 Body mass index (BMI) 32.0-32.9, adult: Secondary | ICD-10-CM | POA: Diagnosis not present

## 2022-05-21 DIAGNOSIS — E1169 Type 2 diabetes mellitus with other specified complication: Secondary | ICD-10-CM | POA: Diagnosis not present

## 2022-05-21 DIAGNOSIS — I509 Heart failure, unspecified: Secondary | ICD-10-CM | POA: Diagnosis not present

## 2022-05-21 DIAGNOSIS — E7801 Familial hypercholesterolemia: Secondary | ICD-10-CM | POA: Diagnosis not present

## 2022-05-21 DIAGNOSIS — R030 Elevated blood-pressure reading, without diagnosis of hypertension: Secondary | ICD-10-CM | POA: Diagnosis not present

## 2022-05-21 DIAGNOSIS — E039 Hypothyroidism, unspecified: Secondary | ICD-10-CM | POA: Diagnosis not present

## 2022-05-21 DIAGNOSIS — D72820 Lymphocytosis (symptomatic): Secondary | ICD-10-CM | POA: Diagnosis not present

## 2022-06-05 DIAGNOSIS — D1801 Hemangioma of skin and subcutaneous tissue: Secondary | ICD-10-CM | POA: Diagnosis not present

## 2022-06-05 DIAGNOSIS — L400 Psoriasis vulgaris: Secondary | ICD-10-CM | POA: Diagnosis not present

## 2022-06-05 DIAGNOSIS — L814 Other melanin hyperpigmentation: Secondary | ICD-10-CM | POA: Diagnosis not present

## 2022-06-05 DIAGNOSIS — Z789 Other specified health status: Secondary | ICD-10-CM | POA: Diagnosis not present

## 2022-06-05 DIAGNOSIS — Z85828 Personal history of other malignant neoplasm of skin: Secondary | ICD-10-CM | POA: Diagnosis not present

## 2022-06-05 DIAGNOSIS — L820 Inflamed seborrheic keratosis: Secondary | ICD-10-CM | POA: Diagnosis not present

## 2022-06-05 DIAGNOSIS — L821 Other seborrheic keratosis: Secondary | ICD-10-CM | POA: Diagnosis not present

## 2022-06-05 DIAGNOSIS — L538 Other specified erythematous conditions: Secondary | ICD-10-CM | POA: Diagnosis not present

## 2022-06-05 DIAGNOSIS — L570 Actinic keratosis: Secondary | ICD-10-CM | POA: Diagnosis not present

## 2022-06-05 DIAGNOSIS — Z08 Encounter for follow-up examination after completed treatment for malignant neoplasm: Secondary | ICD-10-CM | POA: Diagnosis not present

## 2022-06-23 ENCOUNTER — Other Ambulatory Visit: Payer: Self-pay | Admitting: *Deleted

## 2022-06-23 NOTE — Patient Outreach (Signed)
  Care Coordination   06/23/2022  Name: ASCENCION COYE MRN: [id number] DOB: 06-11-1946   Care Coordination Outreach Attempts:  An unsuccessful telephone outreach was attempted today to offer the patient information about available care coordination services as a benefit of their health plan. HIPAA compliant message left on voicemail, providing contact information for CSW, encouraging patient to return CSW's call at his earliest convenience.  Follow Up Plan:  Additional outreach attempts will be made to offer the patient care coordination information and services.   Encounter Outcome:  No Answer.   Care Coordination Interventions Activated:  No.    Care Coordination Interventions:  No, not indicated.    Nat Christen, BSW, MSW, LCSW  Licensed Education officer, environmental [facility]  Mailing [place] [address] [place], [place] [address] Physical Address-[address] [place], [place] [address] Toll Free Main # [phone] Fax # [phone] Cell # [phone] [email or web address]

## 2022-06-30 ENCOUNTER — Other Ambulatory Visit: Payer: Self-pay | Admitting: *Deleted

## 2022-06-30 NOTE — Patient Outreach (Signed)
  Care Coordination   06/30/2022  Name: JORIAN WILLHOITE MRN: [id number] DOB: Sep 16, 1945   Care Coordination Outreach Attempts:  A second unsuccessful outreach was attempted today to offer the patient with information about available care coordination services as a benefit of their health plan.   HIPAA compliant message left on voicemail, providing contact information for CSW, encouraging patient to return CSW's call at his earliest convenience.  Follow Up Plan:  Additional outreach attempts will be made to offer the patient care coordination information and services.   Encounter Outcome:  No Answer.   Care Coordination Interventions:  No, not indicated.    Nat Christen, BSW, MSW, LCSW  Licensed Education officer, environmental [facility]  Mailing [place] [address], [place], [place] [address] Physical Address-[address], [place], [place] [address] Toll Free Main # [phone] Fax # [phone] Cell # [phone] [email or web address]

## 2022-07-02 ENCOUNTER — Telehealth: Payer: Self-pay | Admitting: *Deleted

## 2022-07-02 NOTE — Patient Outreach (Signed)
  Care Coordination   07/02/2022 Name: Kenneth Randall MRN: [id number] DOB: 09/07/1945   Care Coordination Outreach Attempts:  A third unsuccessful outreach was attempted today to offer the patient with information about available care coordination services as a benefit of their health plan.   Follow Up Plan:  No further outreach attempts will be made at this time. We have been unable to contact the patient to offer or enroll patient in care coordination services  Encounter Outcome:  No Answer   Care Coordination Interventions:  No, not indicated    SIG Olivia Pavelko C. Myrtie Neither, MSN, [facility] Gerontological Nurse Practitioner [facility] [phone]

## 2022-08-08 ENCOUNTER — Other Ambulatory Visit: Payer: Self-pay | Admitting: Cardiology

## 2022-09-11 ENCOUNTER — Other Ambulatory Visit: Payer: Self-pay | Admitting: Cardiology

## 2022-10-02 ENCOUNTER — Encounter: Payer: Self-pay | Admitting: Radiology

## 2022-10-16 DIAGNOSIS — L309 Dermatitis, unspecified: Secondary | ICD-10-CM | POA: Diagnosis not present

## 2022-11-27 ENCOUNTER — Encounter: Payer: Self-pay | Admitting: Cardiology

## 2022-11-27 ENCOUNTER — Ambulatory Visit: Payer: PPO | Attending: Cardiology | Admitting: Cardiology

## 2022-11-27 VITALS — BP 128/70 | HR 80 | Ht 69.5 in | Wt 225.2 lb

## 2022-11-27 DIAGNOSIS — I10 Essential (primary) hypertension: Secondary | ICD-10-CM | POA: Diagnosis not present

## 2022-11-27 DIAGNOSIS — I350 Nonrheumatic aortic (valve) stenosis: Secondary | ICD-10-CM | POA: Diagnosis not present

## 2022-11-27 DIAGNOSIS — I5022 Chronic systolic (congestive) heart failure: Secondary | ICD-10-CM | POA: Diagnosis not present

## 2022-11-27 DIAGNOSIS — E782 Mixed hyperlipidemia: Secondary | ICD-10-CM | POA: Diagnosis not present

## 2022-11-27 DIAGNOSIS — I2510 Atherosclerotic heart disease of native coronary artery without angina pectoris: Secondary | ICD-10-CM

## 2022-11-27 NOTE — Patient Instructions (Signed)
Medication Instructions:  Continue all current medications.   Labwork: none  Testing/Procedures: Your physician has requested that you have an echocardiogram. Echocardiography is a painless test that uses sound waves to create images of your heart. It provides your doctor with information about the size and shape of your heart and how well your heart's chambers and valves are working. This procedure takes approximately one hour. There are no restrictions for this procedure. Please do NOT wear cologne, perfume, aftershave, or lotions (deodorant is allowed). Please arrive 15 minutes prior to your appointment time.  DUE IN JUNE   Follow-Up: 6 months   Any Other Special Instructions Will Be Listed Below (If Applicable).   If you need a refill on your cardiac medications before your next appointment, please call your pharmacy.

## 2022-11-27 NOTE — Progress Notes (Signed)
Clinical Summary Mr. Sime is a 77 y.o.male seen today for follow up of the following medical problems.    1. Chronic systolic HF/ICM - from [facility] notes 12/2019 echo showed LVEF 20-25%, new diagnosis for patient - at that time had significant volume overload, succesfully diuresed and started on medical therapy.   02/2020 cath: occluded LAD, occluded LCX, occluded RCA. SVG-RPDA, SVG-D2/distal LAD patent, sequential graft to OM1 patent. Mean PA 20, PCWP 10, LVEDP 7, CI 2.5  - 03/2020 [facility]: consistent with prior scar, LVEF 30%  - 01/2021 echo LVEF 40-45%  -hyperkalemia on entresto alone, also with aldactone alone.    02/2022 echo: LVEF 40-45%, apex akinetic  - no recent chest pains, no SOB/DOE - compliant with meds     2. PSVT - noted on prior zio patch done at [facility]   --no palpitatoins     3. CAD - history of 5 vessel CABG in 1999 -02/2020 cath: occluded LAD, occluded LCX, occluded RCA. SVG-RPDA, SVG-D2/distal LAD patent, sequential graft to OM1 patent. Mean PA 20, PCWP 10, LVEDP 7, CI 2.5   -no chest pains   4. HTN - he is compliant with meds      5. Aortic stenosis - 01/2021 echo mean grade 18, AVA VTI 1.02. Mild to moderate AS.   - 01/2022 echo: mod AS mean grad 20, AVA VTI  0.87, DI 0.31, SVI 25 - no symptoms     6. Hyperlipidemia - he ison crestor 20mg  daily.   - 10/2020 TC 151 TG 209 HDL 33 LDL 83 - labs followed by pcp  - upcoming labs in May -muscle aches on crestor 40mg , tolerating 20mg           Past Medical History:  Diagnosis Date   CAD (coronary artery disease)    Cancer    CHF (congestive heart failure)    Hypertension    Ischemic cardiomyopathy    PSVT (paroxysmal supraventricular tachycardia)      No Known Allergies   Current Outpatient Medications  Medication Sig Dispense Refill   acetaminophen (TYLENOL) 650 MG CR tablet Take 1,300 mg by mouth every 8 (eight) hours as needed for pain.     aspirin EC 81 MG tablet Take 1 tablet (81 mg  total) by mouth daily. Swallow whole.     Coenzyme Q10 (COQ10) 100 MG CAPS Take 100 mg by mouth daily.     furosemide (LASIX) 20 MG tablet Take 1 tablet (20 mg total) by mouth daily. May take additional 20 mg PRN severe swelling 90 tablet 2   glipiZIDE (GLUCOTROL XL) 5 MG 24 hr tablet Take 5 mg by mouth daily.     hydrALAZINE (APRESOLINE) 25 MG tablet TAKE 1 TABLET BY MOUTH TWICE A DAY 180 tablet 1   isosorbide mononitrate (IMDUR) 30 MG 24 hr tablet TAKE 1/2 OF A TABLET (15 MG TOTAL) BY MOUTH DAILY 45 tablet 3   JARDIANCE 10 MG TABS tablet Take 10 mg by mouth daily.     levothyroxine (SYNTHROID) 50 MCG tablet Take 75 mcg by mouth daily.     metFORMIN (GLUCOPHAGE-XR) 500 MG 24 hr tablet Take 500 mg by mouth 2 (two) times daily.     metoprolol succinate (TOPROL-XL) 100 MG 24 hr tablet TAKE 1.5 TABLETS (150 MG TOTAL) BY MOUTH DAILY. TAKE WITH OR IMMEDIATELY FOLLOWING A MEAL. 135 tablet 3   rosuvastatin (CRESTOR) 40 MG tablet Take 0.5 tablets by mouth daily.     No current  facility-administered medications for this visit.     Past Surgical History:  Procedure Laterality Date   RIGHT/LEFT HEART CATH AND CORONARY/GRAFT ANGIOGRAPHY N/A 02/08/2020   Procedure: RIGHT/LEFT HEART CATH AND CORONARY/GRAFT ANGIOGRAPHY;  Surgeon: Kathleene Hazel, MD;  Location: [facility] INVASIVE CV LAB;  Service: Cardiovascular;  Laterality: N/A;   SKIN CANCER EXCISION       No Known Allergies    Family History  Problem Relation Age of Onset   Heart attack Mother 15   Rheum arthritis Mother    Diabetes Father    Heart disease Father        died after bypass surgery   Cancer Brother        jaw    Stroke Paternal Grandmother      Social History Mr. Clyne reports that he quit smoking about 32 years ago. His smoking use included cigarettes. He has never used smokeless tobacco. Mr. Ayyad reports current alcohol use.   Review of Systems CONSTITUTIONAL: No weight loss, fever, chills, weakness or fatigue.   HEENT: Eyes: No visual loss, blurred vision, double vision or yellow sclerae.No hearing loss, sneezing, congestion, runny nose or sore throat.  SKIN: No rash or itching.  CARDIOVASCULAR: per hpi RESPIRATORY: No shortness of breath, cough or sputum.  GASTROINTESTINAL: No anorexia, nausea, vomiting or diarrhea. No abdominal pain or blood.  GENITOURINARY: No burning on urination, no polyuria NEUROLOGICAL: No headache, dizziness, syncope, paralysis, ataxia, numbness or tingling in the extremities. No change in bowel or bladder control.  MUSCULOSKELETAL: No muscle, back pain, joint pain or stiffness.  LYMPHATICS: No enlarged nodes. No history of splenectomy.  PSYCHIATRIC: No history of depression or anxiety.  ENDOCRINOLOGIC: No reports of sweating, cold or heat intolerance. No polyuria or polydipsia.  Marland Kitchen   Physical Examination Vitals:   11/27/22 0945 11/27/22 1023  BP: (!) 140/84 128/70  Pulse: 80   SpO2: 97%    Filed Weights   11/27/22 0945  Weight: 225 lb 3.2 oz (102.2 kg)    Gen: resting comfortably, no acute distress HEENT: no scleral icterus, pupils equal round and reactive, no palptable cervical adenopathy,  CV: RRR, 2/6 systolic murmur rusb, no jvd Resp: Clear to auscultation bilaterally GI: abdomen is soft, non-tender, non-distended, normal bowel sounds, no hepatosplenomegaly MSK: extremities are warm, no edema.  Skin: warm, no rash Neuro:  no focal deficits Psych: appropriate affect   Diagnostic Studies  Echo: 12/2019 Summary 1. Technically difficult study due to body habitus. 2. Echo contrast utilized to enhance endocardial border definition. 3. The left ventricle is mildly to moderately dilated in size with upper normal wall thickness. 4. The left ventricular systolic function is severely decreased, LVEF is visually estimated at 20-25%. 5. There is moderate mitral valve regurgitation. 6. There is mild to moderate aortic valve stenosis. 7. There is mild aortic  regurgitation. 8. The left atrium is moderately dilated in size. 9. The right ventricle is normal in size, with normal systolic function. 10. The right atrium is mildly dilated in size.   02/2020 RHC/LHC Prox RCA to Mid RCA lesion is 50% stenosed. Mid RCA lesion is 100% stenosed. SVG graft was visualized by angiography and is normal in caliber. Ost LAD to Prox LAD lesion is 100% stenosed. SVG graft was visualized by angiography and is normal in caliber. Prox Cx lesion is 100% stenosed. Mid LAD to Dist LAD lesion is 50% stenosed.   1. Severe triple vessel CAD s/p 5V CABG with 5/5 patent bypass grafts. 2. The  LAD is occluded proximally. The LAD and diagonal fill from the patent vein graft. The LIMA does not appear to have been used for bypass. 3. The Circumflex is occluded in the mid vessel. The patent sequential vein graft fills the proximal and distal OM branches 4. The RCA is occluded in the mid to distal vessel. The distal RCA/PDA and PLA fills from left to right collaterals and from the patent vein graft 5. Normal filling pressures.   Recommendations: Continue medical management of CAD.    11/2019 [facility] Zio patch: min HR 57, Max HR 255, Avg HF 101.4.8% PVCs. Rare supraventricular ectopy, some runs of atach. Short runs of NSVT   03/2020 [facility]RI IMPRESSION: 1. Mild LV dilatation with severe systolic dysfunction (EF 26%). 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 Hypokinesis of mid anterior/anterolateral walls and apical anterior/septal/lateral walls. Akinesis of mid inferolateral wall   2. Subendocardial late gadolinium enhancement consistent with prior infarcts in LAD and LCX territories. LGE is <50% transmural suggesting viability in mid anterior/anterolateral walls and apical septal/anterior/inferior/lateral walls. LGE >50% transmural suggesting nonviability at apex. In addition, there is significant thinning in mid inferolateral wall (wall thickness 4mm), suggesting nonviability.   3.  Normal  RV size and systolic function (EF 50%)   4.  Mild mitral regurgitation (regurgitant fraction 19%)     01/2021 echo 1. Left ventricular ejection fraction, by estimation, is 40 to 45%. The  left ventricle has mildly decreased function. The left ventricle  demonstrates regional wall motion abnormalities (see scoring  diagram/findings for description). The left ventricular   internal cavity size was mildly to moderately dilated. Left ventricular  diastolic parameters are consistent with Grade I diastolic dysfunction  (impaired relaxation). Reduced global longitudinal strain of -10.3%. Would  consider future studies with  Definity contrast.   2. Right ventricular systolic function is normal. The right ventricular  size is normal. Tricuspid regurgitation signal is inadequate for assessing  PA pressure.   3. Left atrial size was mildly dilated.   4. The mitral valve is grossly normal. Mild to moderate mitral valve  regurgitation.   5. The aortic valve has an indeterminant number of cusps. There is  moderate calcification of the aortic valve. Aortic valve regurgitation is  mild. Moderate aortic valve stenosis. Aortic regurgitation PHT measures  758 msec. Aortic valve mean gradient  measures 18.0 mmHg. Dimentionless index 0.36.   6. The inferior vena cava is normal in size with greater than 50%  respiratory variability, suggesting right atrial pressure of 3 mmHg.    01/2022 echo  1. Acoustic shadowing at LV apex - cannot exclude relatively large,  rounded mural thormbus. Suggest Definity contrast study to clarify.   2. Left ventricular ejection fraction, by estimation, is 40 to 45%. The  left ventricle has mildly decreased function. The left ventricle  demonstrates regional wall motion abnormalities (see scoring  diagram/findings for description). The left ventricular   internal cavity size was mildly dilated. Left ventricular diastolic  parameters are consistent with Grade II diastolic  dysfunction  (pseudonormalization). Elevated left ventricular end-diastolic pressure.  The average left ventricular global longitudinal  strain is -11.7 %. The global longitudinal strain is abnormal.   3. Right ventricular systolic function is normal. The right ventricular  size is normal. The estimated right ventricular systolic pressure is 19.6  mmHg.   4. Left atrial size was mildly dilated.   5. The mitral valve is degenerative. Mild to moderate mitral valve  regurgitation.   6. The aortic valve is  tricuspid. There is moderate calcification of the  aortic valve. Aortic valve regurgitation is mild. Moderate aortic valve  stenosis. Aortic regurgitation PHT measures 416 msec. Aortic valve mean  gradient measures 20.0 mmHg.  Dimentionless index 0.31.   7. The inferior vena cava is normal in size with greater than 50%  respiratory variability, suggesting right atrial pressure of 3 mmHg.      02/2022 echo 1. Left ventricular ejection fraction, by estimation, is 40 to 45%. The  left ventricle has mildly decreased function. The left ventricle  demonstrates regional wall motion abnormalities (see scoring  diagram/findings for description).   2. No apical thrombus seen      Assessment and Plan  1. Chronic systolic HF - new diagnosis based on 12/2019 echo at [facility], LVEF 20-25% - cath with patent grafts, normal filling pressures  - repeat echo shows LVEF is improving, now 40-45% - hyperkalemia on aldactone, then recurrent hyperkalemia on entresto. Off both medications, at this time avoiding ACE/ARB/ARNI/MRAs  - euvolemic today without symptoms, continue current meds   2. CAD - 2021 cath with patent grafts -no symptoms, continue current meds   3. Aortic stenosis - repeat echo 01/2023   4. HTN -he is at goal based on manual check, continue current meds   5. Hyperlipidemia - f/u upcoming labs. Goal LDL at least <70, did not tolerate higher crestor dosing. May need to consider adding  zetia pending labs.    F/u 6 months      Antoine Poche, M.D

## 2022-12-04 DIAGNOSIS — L814 Other melanin hyperpigmentation: Secondary | ICD-10-CM | POA: Diagnosis not present

## 2022-12-04 DIAGNOSIS — L821 Other seborrheic keratosis: Secondary | ICD-10-CM | POA: Diagnosis not present

## 2022-12-04 DIAGNOSIS — L570 Actinic keratosis: Secondary | ICD-10-CM | POA: Diagnosis not present

## 2022-12-04 DIAGNOSIS — D1801 Hemangioma of skin and subcutaneous tissue: Secondary | ICD-10-CM | POA: Diagnosis not present

## 2022-12-04 DIAGNOSIS — Z08 Encounter for follow-up examination after completed treatment for malignant neoplasm: Secondary | ICD-10-CM | POA: Diagnosis not present

## 2022-12-04 DIAGNOSIS — B358 Other dermatophytoses: Secondary | ICD-10-CM | POA: Diagnosis not present

## 2022-12-04 DIAGNOSIS — Z85828 Personal history of other malignant neoplasm of skin: Secondary | ICD-10-CM | POA: Diagnosis not present

## 2022-12-09 ENCOUNTER — Telehealth: Payer: Self-pay | Admitting: Cardiology

## 2022-12-09 NOTE — Telephone Encounter (Signed)
Informed and reports he is unsure what his HR ranges at home but will starting checking it daily for the next week. Reports will be taking terbinafine for 2 weeks for a fungal infection. Advised to call office with average HR readings. Verbalized understanding.

## 2022-12-09 NOTE — Telephone Encounter (Signed)
Terbinafine may increase the concentration of metoprolol. Please find out what his resting heart rate is. Last time he was in the office it was 80, in which case no changes would be needed.

## 2022-12-09 NOTE — Telephone Encounter (Signed)
Pt came into the office stating his dermatologist wants him to start taking Terbinafine HCL 250mg  Tablet but there is an interaction with his Metoprolol   Please give pt a call @ [phone]

## 2022-12-11 DIAGNOSIS — E559 Vitamin D deficiency, unspecified: Secondary | ICD-10-CM | POA: Diagnosis not present

## 2022-12-11 DIAGNOSIS — Z0001 Encounter for general adult medical examination with abnormal findings: Secondary | ICD-10-CM | POA: Diagnosis not present

## 2022-12-11 DIAGNOSIS — E7801 Familial hypercholesterolemia: Secondary | ICD-10-CM | POA: Diagnosis not present

## 2022-12-11 DIAGNOSIS — R5382 Chronic fatigue, unspecified: Secondary | ICD-10-CM | POA: Diagnosis not present

## 2022-12-11 DIAGNOSIS — E1165 Type 2 diabetes mellitus with hyperglycemia: Secondary | ICD-10-CM | POA: Diagnosis not present

## 2022-12-11 DIAGNOSIS — I10 Essential (primary) hypertension: Secondary | ICD-10-CM | POA: Diagnosis not present

## 2022-12-11 DIAGNOSIS — E039 Hypothyroidism, unspecified: Secondary | ICD-10-CM | POA: Diagnosis not present

## 2022-12-11 DIAGNOSIS — E875 Hyperkalemia: Secondary | ICD-10-CM | POA: Diagnosis not present

## 2022-12-18 DIAGNOSIS — E7801 Familial hypercholesterolemia: Secondary | ICD-10-CM | POA: Diagnosis not present

## 2022-12-18 DIAGNOSIS — I509 Heart failure, unspecified: Secondary | ICD-10-CM | POA: Diagnosis not present

## 2022-12-18 DIAGNOSIS — R030 Elevated blood-pressure reading, without diagnosis of hypertension: Secondary | ICD-10-CM | POA: Diagnosis not present

## 2022-12-18 DIAGNOSIS — E1169 Type 2 diabetes mellitus with other specified complication: Secondary | ICD-10-CM | POA: Diagnosis not present

## 2022-12-18 DIAGNOSIS — Z0001 Encounter for general adult medical examination with abnormal findings: Secondary | ICD-10-CM | POA: Diagnosis not present

## 2022-12-18 DIAGNOSIS — Z6833 Body mass index (BMI) 33.0-33.9, adult: Secondary | ICD-10-CM | POA: Diagnosis not present

## 2022-12-18 DIAGNOSIS — E039 Hypothyroidism, unspecified: Secondary | ICD-10-CM | POA: Diagnosis not present

## 2022-12-31 ENCOUNTER — Encounter: Payer: Self-pay | Admitting: Cardiology

## 2023-01-05 ENCOUNTER — Encounter: Payer: Self-pay | Admitting: *Deleted

## 2023-01-07 NOTE — Telephone Encounter (Signed)
I was the HRs from May that was on original submitted form that is scanned is, are there additional numbers. Essentialyl would just want heart rate to stay above 50s  Dominga Ferry MD

## 2023-01-07 NOTE — Telephone Encounter (Signed)
Correction I saw the HRs from May, typo on original message  Dominga Ferry MD

## 2023-02-10 ENCOUNTER — Other Ambulatory Visit: Payer: Self-pay | Admitting: Cardiology

## 2023-02-13 ENCOUNTER — Ambulatory Visit (HOSPITAL_COMMUNITY)
Admission: RE | Admit: 2023-02-13 | Discharge: 2023-02-13 | Disposition: A | Discharge status: Home / Self Care | Payer: PPO | Source: Ambulatory Visit | Attending: Cardiology | Admitting: Cardiology

## 2023-02-13 DIAGNOSIS — I350 Nonrheumatic aortic (valve) stenosis: Secondary | ICD-10-CM | POA: Insufficient documentation

## 2023-02-13 LAB — ECHOCARDIOGRAM COMPLETE
AR max vel: 1.05 cm2
AV Area VTI: 0.96 cm2
AV Area mean vel: 0.93 cm2
AV Mean grad: 20 mmHg
AV Peak grad: 29.8 mmHg
Ao pk vel: 2.73 m/s
Area-P 1/2: 4.31 cm2
S' Lateral: 4.9 cm

## 2023-02-13 MED ORDER — PERFLUTREN LIPID MICROSPHERE
1.0000 mL | INTRAVENOUS | Status: AC | PRN
  Administered 2023-02-13: 4 mL via INTRAVENOUS

## 2023-02-13 NOTE — Progress Notes (Signed)
*  PRELIMINARY RESULTS* Echocardiogram 2D Echocardiogram has been performed with Definity.  Stacey Drain 02/13/2023, 11:29 AM

## 2023-02-24 ENCOUNTER — Encounter: Payer: Self-pay | Admitting: *Deleted

## 2023-03-05 ENCOUNTER — Other Ambulatory Visit: Payer: Self-pay | Admitting: Cardiology

## 2023-03-10 ENCOUNTER — Ambulatory Visit: Payer: PPO | Attending: Cardiology | Admitting: Cardiology

## 2023-03-10 VITALS — BP 138/90 | HR 88 | Ht 70.0 in | Wt 223.6 lb

## 2023-03-10 DIAGNOSIS — I498 Other specified cardiac arrhythmias: Secondary | ICD-10-CM | POA: Diagnosis not present

## 2023-03-10 DIAGNOSIS — I10 Essential (primary) hypertension: Secondary | ICD-10-CM

## 2023-03-10 DIAGNOSIS — I5022 Chronic systolic (congestive) heart failure: Secondary | ICD-10-CM

## 2023-03-10 DIAGNOSIS — I255 Ischemic cardiomyopathy: Secondary | ICD-10-CM

## 2023-03-10 MED ORDER — ROSUVASTATIN CALCIUM 20 MG PO TABS: 20.0000 mg | ORAL_TABLET | Freq: Every day | ORAL | 1 refills | Status: DC

## 2023-03-10 NOTE — H&P (View-Only) (Signed)
 Clinical Summary Mr. Eschbach is a 77 y.o.male seen today for follow up of the following medical problems.    1. Chronic systolic HF/ICM - from [facility] notes 12/2019 echo showed LVEF 20-25%, new diagnosis for patient - at that time had significant volume overload, succesfully diuresed and started on medical therapy.   02/2020 cath: occluded LAD, occluded LCX, occluded RCA. SVG-RPDA, SVG-D2/distal LAD patent, sequential graft to OM1 patent. Mean PA 20, PCWP 10, LVEDP 7, CI 2.5  - 03/2020 [facility]: consistent with prior scar, LVEF 30%  - 01/2021 echo LVEF 40-45%   -hyperkalemia on entresto alone, also with aldactone alone.    02/2022 echo: LVEF 40-45%, apex akinetic  02/2023 echo: LVEF 30-35%, grade II dd, mod to severe low flow low gradient AS mean grad 20, AVA VTI 0.97 DI 0.25, mod to severe AI   - no SOB/DOE, no recent chest pains - sedentary lifestyle - compliant with meds      2. PSVT - noted on prior zio patch done at [facility]      3. CAD - history of 5 vessel CABG in 1999 -02/2020 cath: occluded LAD, occluded LCX, occluded RCA. SVG-RPDA, SVG-D2/distal LAD patent, sequential graft to OM1 patent. Mean PA 20, PCWP 10, LVEDP 7, CI 2.5   -denies any recent chest pains.      4. HTN - he is compliant with meds      5. Aortic stenosis - 01/2021 echo mean grade 18, AVA VTI 1.02. Mild to moderate AS.   - 01/2022 echo: mod AS mean grad 20, AVA VTI  0.87, DI 0.31, SVI 25  7 /2024 echo: LVEF 30-35%, grade II dd, mod to severe low flow low gradient AS mean grad 20, AVA VTI 0.97 DI 0.25, mod to severe AI   6. Hyperlipidemia -muscle aches on crestor 40mg , tolerating 20mg           Past Medical History:  Diagnosis Date   CAD (coronary artery disease)    Cancer (HCC)    CHF (congestive heart failure) (HCC)    Hypertension    Ischemic cardiomyopathy    PSVT (paroxysmal supraventricular tachycardia)      No Known Allergies   Current Outpatient Medications  Medication Sig Dispense  Refill   acetaminophen (TYLENOL) 650 MG CR tablet Take 1,300 mg by mouth every 8 (eight) hours as needed for pain.     aspirin EC 81 MG tablet Take 1 tablet (81 mg total) by mouth daily. Swallow whole.     Coenzyme Q10 (COQ10) 100 MG CAPS Take 100 mg by mouth daily.     furosemide (LASIX) 20 MG tablet TAKE 1 TABLET (20 MG TOTAL) BY MOUTH DAILY. MAY TAKE ADDITIONAL 20 MG AS NEEDED FOR SWELLING 90 tablet 2   glipiZIDE (GLUCOTROL XL) 5 MG 24 hr tablet Take 5 mg by mouth daily.     hydrALAZINE (APRESOLINE) 25 MG tablet TAKE 1 TABLET BY MOUTH TWICE A DAY 180 tablet 1   isosorbide mononitrate (IMDUR) 30 MG 24 hr tablet TAKE 1/2 OF A TABLET (15 MG TOTAL) BY MOUTH DAILY 45 tablet 3   JARDIANCE 10 MG TABS tablet Take 10 mg by mouth daily.     levothyroxine (SYNTHROID) 50 MCG tablet Take 75 mcg by mouth daily.     metFORMIN (GLUCOPHAGE-XR) 500 MG 24 hr tablet Take 500 mg by mouth 2 (two) times daily.     metoprolol succinate (TOPROL-XL) 100 MG 24 hr tablet TAKE 1.5 TABLETS (150  MG TOTAL) BY MOUTH DAILY. TAKE WITH OR IMMEDIATELY FOLLOWING A MEAL. 135 tablet 3   rosuvastatin (CRESTOR) 40 MG tablet Take 0.5 tablets by mouth daily.     No current facility-administered medications for this visit.     Past Surgical History:  Procedure Laterality Date   RIGHT/LEFT HEART CATH AND CORONARY/GRAFT ANGIOGRAPHY N/A 02/08/2020   Procedure: RIGHT/LEFT HEART CATH AND CORONARY/GRAFT ANGIOGRAPHY;  Surgeon: Kathleene Hazel, MD;  Location: [facility] INVASIVE CV [facility];  Service: Cardiovascular;  Laterality: N/A;   SKIN CANCER EXCISION       No Known Allergies    Family History  Problem Relation Age of Onset   Heart attack Mother 29   Rheum arthritis Mother    Diabetes Father    Heart disease Father        died after bypass surgery   Cancer Brother        jaw    Stroke Paternal Grandmother      Social History Mr. Kirchberg reports that he quit smoking about 33 years ago. His smoking use included cigarettes.  He has never used smokeless tobacco. Mr. Ebel reports current alcohol use.   Review of Systems CONSTITUTIONAL: No weight loss, fever, chills, weakness or fatigue.  HEENT: Eyes: No visual loss, blurred vision, double vision or yellow sclerae.No hearing loss, sneezing, congestion, runny nose or sore throat.  SKIN: No rash or itching.  CARDIOVASCULAR: per hpi RESPIRATORY: No shortness of breath, cough or sputum.  GASTROINTESTINAL: No anorexia, nausea, vomiting or diarrhea. No abdominal pain or blood.  GENITOURINARY: No burning on urination, no polyuria NEUROLOGICAL: No headache, dizziness, syncope, paralysis, ataxia, numbness or tingling in the extremities. No change in bowel or bladder control.  MUSCULOSKELETAL: No muscle, back pain, joint pain or stiffness.  LYMPHATICS: No enlarged nodes. No history of splenectomy.  PSYCHIATRIC: No history of depression or anxiety.  ENDOCRINOLOGIC: No reports of sweating, cold or heat intolerance. No polyuria or polydipsia.  Marland Kitchen   Physical Examination Today's Vitals   76/06/24 1456  BP: (!) 138/90  Pulse: 88  SpO2: 95%  Weight: 223 lb 9.6 oz (101.4 kg)  Height: 5\' 10"  (1.778 m)  PainSc: 0-No pain   Body mass index is 32.08 kg/m.  Gen: resting comfortably, no acute distress HEENT: no scleral icterus, pupils equal round and reactive, no palptable cervical adenopathy,  CV: RRR, 3/6 systolic murmur rusb, no jvd Resp: Clear to auscultation bilaterally GI: abdomen is soft, non-tender, non-distended, normal bowel sounds, no hepatosplenomegaly MSK: extremities are warm, no edema.  Skin: warm, no rash Neuro:  no focal deficits Psych: appropriate affect   Diagnostic Studies  Echo: 12/2019 Summary 1. Technically difficult study due to body habitus. 2. Echo contrast utilized to enhance endocardial border definition. 3. The left ventricle is mildly to moderately dilated in size with upper normal wall thickness. 4. The left ventricular systolic  function is severely decreased, LVEF is visually estimated at 20-25%. 5. There is moderate mitral valve regurgitation. 6. There is mild to moderate aortic valve stenosis. 7. There is mild aortic regurgitation. 8. The left atrium is moderately dilated in size. 9. The right ventricle is normal in size, with normal systolic function. 10. The right atrium is mildly dilated in size.   02/2020 RHC/LHC Prox RCA to Mid RCA lesion is 50% stenosed. Mid RCA lesion is 100% stenosed. SVG graft was visualized by angiography and is normal in caliber. Ost LAD to Prox LAD lesion is 100% stenosed. SVG graft was visualized by  angiography and is normal in caliber. Prox Cx lesion is 100% stenosed. Mid LAD to Dist LAD lesion is 50% stenosed.   1. Severe triple vessel CAD s/p 5V CABG with 5/5 patent bypass grafts. 2. The LAD is occluded proximally. The LAD and diagonal fill from the patent vein graft. The LIMA does not appear to have been used for bypass. 3. The Circumflex is occluded in the mid vessel. The patent sequential vein graft fills the proximal and distal OM branches 4. The RCA is occluded in the mid to distal vessel. The distal RCA/PDA and PLA fills from left to right collaterals and from the patent vein graft 5. Normal filling pressures.   Recommendations: Continue medical management of CAD.    11/2019 [facility] Zio patch: min HR 57, Max HR 255, Avg HF 101.4.8% PVCs. Rare supraventricular ectopy, some runs of atach. Short runs of NSVT   03/2020 [facility]RI IMPRESSION: 1. Mild LV dilatation with severe systolic dysfunction (EF 26%). 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 Hypokinesis of mid anterior/anterolateral walls and apical anterior/septal/lateral walls. Akinesis of mid inferolateral wall   2. Subendocardial late gadolinium enhancement consistent with prior infarcts in LAD and LCX territories. LGE is <50% transmural suggesting viability in mid anterior/anterolateral walls and  apical septal/anterior/inferior/lateral walls. LGE >50% transmural suggesting nonviability at apex. In addition, there is significant thinning in mid inferolateral wall (wall thickness 4mm), suggesting nonviability.   3.  Normal RV size and systolic function (EF 50%)   4.  Mild mitral regurgitation (regurgitant fraction 19%)     01/2021 echo 1. Left ventricular ejection fraction, by estimation, is 40 to 45%. The  left ventricle has mildly decreased function. The left ventricle  demonstrates regional wall motion abnormalities (see scoring  diagram/findings for description). The left ventricular   internal cavity size was mildly to moderately dilated. Left ventricular  diastolic parameters are consistent with Grade I diastolic dysfunction  (impaired relaxation). Reduced global longitudinal strain of -10.3%. Would  consider future studies with  Definity contrast.   2. Right ventricular systolic function is normal. The right ventricular  size is normal. Tricuspid regurgitation signal is inadequate for assessing  PA pressure.   3. Left atrial size was mildly dilated.   4. The mitral valve is grossly normal. Mild to moderate mitral valve  regurgitation.   5. The aortic valve has an indeterminant number of cusps. There is  moderate calcification of the aortic valve. Aortic valve regurgitation is  mild. Moderate aortic valve stenosis. Aortic regurgitation PHT measures  758 msec. Aortic valve mean gradient  measures 18.0 mmHg. Dimentionless index 0.36.   6. The inferior vena cava is normal in size with greater than 50%  respiratory variability, suggesting right atrial pressure of 3 mmHg.    01/2022 echo  1. Acoustic shadowing at LV apex - cannot exclude relatively large,  rounded mural thormbus. Suggest Definity contrast study to clarify.   2. Left ventricular ejection fraction, by estimation, is 40 to 45%. The  left ventricle has mildly decreased function. The left ventricle   demonstrates regional wall motion abnormalities (see scoring  diagram/findings for description). The left ventricular   internal cavity size was mildly dilated. Left ventricular diastolic  parameters are consistent with Grade II diastolic dysfunction  (pseudonormalization). Elevated left ventricular end-diastolic pressure.  The average left ventricular global longitudinal  strain is -11.7 %. The global longitudinal strain is abnormal.   3. Right ventricular systolic function is normal. The right ventricular  size is normal. The estimated right  ventricular systolic pressure is 19.6  mmHg.   4. Left atrial size was mildly dilated.   5. The mitral valve is degenerative. Mild to moderate mitral valve  regurgitation.   6. The aortic valve is tricuspid. There is moderate calcification of the  aortic valve. Aortic valve regurgitation is mild. Moderate aortic valve  stenosis. Aortic regurgitation PHT measures 416 msec. Aortic valve mean  gradient measures 20.0 mmHg.  Dimentionless index 0.31.   7. The inferior vena cava is normal in size with greater than 50%  respiratory variability, suggesting right atrial pressure of 3 mmHg.      02/2022 echo 1. Left ventricular ejection fraction, by estimation, is 40 to 45%. The  left ventricle has mildly decreased function. The left ventricle  demonstrates regional wall motion abnormalities (see scoring  diagram/findings for description).   2. No apical thrombus seen      Assessment and Plan   1. Chronic systolic HF - routine echo to follow his aortic valve shows his LVEF has declined - prevously 40-45%, now down to 30-35% - he denies any new significant symptoms though sedentary lifestyle  - given history of CAD with prior CABG will plan for cath to reevalaute his coronary anatomy - his aortic valve meets criteria for low flow low gradient severe AS which is another possible etiology of EF drop. At time of cath would assess aortic gradient, may  need to consider CT aortic valve calcium score vs dobutamine echo.  - would plan for RHC/LHC with aortic valve gradients when patient calls [place] with his available dates  - hyperkalemia on aldactone, then recurrent hyperkalemia on entresto. Off both medications, at this time avoiding ACE/ARB/ARNI/MRAs      2. CAD - 2021 cath with patent grafts -no recent symptoms but with drop in LVEF would plan cath. Patient to call us back when know his schedule.    3. Aortic stenosis - is aortic valve meets criteria for low flow low gradient severe AS which is another possible etiology of EF drop. At time of cath would assess aortic gradient, may need to consider CT aortic valve calcium score vs dobutamine echo     Antoine Poche, M.D.

## 2023-03-10 NOTE — Progress Notes (Signed)
Clinical Summary Kenneth Randall is a 77 y.o.male seen today for follow up of the following medical problems.    1. Chronic systolic HF/ICM - from [facility] notes 12/2019 echo showed LVEF 20-25%, new diagnosis for patient - at that time had significant volume overload, succesfully diuresed and started on medical therapy.   02/2020 cath: occluded LAD, occluded LCX, occluded RCA. SVG-RPDA, SVG-D2/distal LAD patent, sequential graft to OM1 patent. Mean PA 20, PCWP 10, LVEDP 7, CI 2.5  - 03/2020 [facility]: consistent with prior scar, LVEF 30%  - 01/2021 echo LVEF 40-45%   -hyperkalemia on entresto alone, also with aldactone alone.    02/2022 echo: LVEF 40-45%, apex akinetic  02/2023 echo: LVEF 30-35%, grade II dd, mod to severe low flow low gradient AS mean grad 20, AVA VTI 0.97 DI 0.25, mod to severe AI   - no SOB/DOE, no recent chest pains - sedentary lifestyle - compliant with meds      2. PSVT - noted on prior zio patch done at [facility]      3. CAD - history of 5 vessel CABG in 1999 -02/2020 cath: occluded LAD, occluded LCX, occluded RCA. SVG-RPDA, SVG-D2/distal LAD patent, sequential graft to OM1 patent. Mean PA 20, PCWP 10, LVEDP 7, CI 2.5   -denies any recent chest pains.      4. HTN - he is compliant with meds      5. Aortic stenosis - 01/2021 echo mean grade 18, AVA VTI 1.02. Mild to moderate AS.   - 01/2022 echo: mod AS mean grad 20, AVA VTI  0.87, DI 0.31, SVI 25  7 /2024 echo: LVEF 30-35%, grade II dd, mod to severe low flow low gradient AS mean grad 20, AVA VTI 0.97 DI 0.25, mod to severe AI   6. Hyperlipidemia -muscle aches on crestor 40mg , tolerating 20mg           Past Medical History:  Diagnosis Date   CAD (coronary artery disease)    Cancer (HCC)    CHF (congestive heart failure) (HCC)    Hypertension    Ischemic cardiomyopathy    PSVT (paroxysmal supraventricular tachycardia)      No Known Allergies   Current Outpatient Medications  Medication Sig Dispense  Refill   acetaminophen (TYLENOL) 650 MG CR tablet Take 1,300 mg by mouth every 8 (eight) hours as needed for pain.     aspirin EC 81 MG tablet Take 1 tablet (81 mg total) by mouth daily. Swallow whole.     Coenzyme Q10 (COQ10) 100 MG CAPS Take 100 mg by mouth daily.     furosemide (LASIX) 20 MG tablet TAKE 1 TABLET (20 MG TOTAL) BY MOUTH DAILY. MAY TAKE ADDITIONAL 20 MG AS NEEDED FOR SWELLING 90 tablet 2   glipiZIDE (GLUCOTROL XL) 5 MG 24 hr tablet Take 5 mg by mouth daily.     hydrALAZINE (APRESOLINE) 25 MG tablet TAKE 1 TABLET BY MOUTH TWICE A DAY 180 tablet 1   isosorbide mononitrate (IMDUR) 30 MG 24 hr tablet TAKE 1/2 OF A TABLET (15 MG TOTAL) BY MOUTH DAILY 45 tablet 3   JARDIANCE 10 MG TABS tablet Take 10 mg by mouth daily.     levothyroxine (SYNTHROID) 50 MCG tablet Take 75 mcg by mouth daily.     metFORMIN (GLUCOPHAGE-XR) 500 MG 24 hr tablet Take 500 mg by mouth 2 (two) times daily.     metoprolol succinate (TOPROL-XL) 100 MG 24 hr tablet TAKE 1.5 TABLETS (150  MG TOTAL) BY MOUTH DAILY. TAKE WITH OR IMMEDIATELY FOLLOWING A MEAL. 135 tablet 3   rosuvastatin (CRESTOR) 40 MG tablet Take 0.5 tablets by mouth daily.     No current facility-administered medications for this visit.     Past Surgical History:  Procedure Laterality Date   RIGHT/LEFT HEART CATH AND CORONARY/GRAFT ANGIOGRAPHY N/A 02/08/2020   Procedure: RIGHT/LEFT HEART CATH AND CORONARY/GRAFT ANGIOGRAPHY;  Surgeon: Kathleene Hazel, MD;  Location: [facility] INVASIVE CV LAB;  Service: Cardiovascular;  Laterality: N/A;   SKIN CANCER EXCISION       No Known Allergies    Family History  Problem Relation Age of Onset   Heart attack Mother 29   Rheum arthritis Mother    Diabetes Father    Heart disease Father        died after bypass surgery   Cancer Brother        jaw    Stroke Paternal Grandmother      Social History Kenneth Randall reports that he quit smoking about 33 years ago. His smoking use included cigarettes.  He has never used smokeless tobacco. Kenneth Randall reports current alcohol use.   Review of Systems CONSTITUTIONAL: No weight loss, fever, chills, weakness or fatigue.  HEENT: Eyes: No visual loss, blurred vision, double vision or yellow sclerae.No hearing loss, sneezing, congestion, runny nose or sore throat.  SKIN: No rash or itching.  CARDIOVASCULAR: per hpi RESPIRATORY: No shortness of breath, cough or sputum.  GASTROINTESTINAL: No anorexia, nausea, vomiting or diarrhea. No abdominal pain or blood.  GENITOURINARY: No burning on urination, no polyuria NEUROLOGICAL: No headache, dizziness, syncope, paralysis, ataxia, numbness or tingling in the extremities. No change in bowel or bladder control.  MUSCULOSKELETAL: No muscle, back pain, joint pain or stiffness.  LYMPHATICS: No enlarged nodes. No history of splenectomy.  PSYCHIATRIC: No history of depression or anxiety.  ENDOCRINOLOGIC: No reports of sweating, cold or heat intolerance. No polyuria or polydipsia.  Marland Kitchen   Physical Examination Today's Vitals   03/10/23 1456  BP: (!) 138/90  Pulse: 88  SpO2: 95%  Weight: 223 lb 9.6 oz (101.4 kg)  Height: 5\' 10"  (1.778 m)  PainSc: 0-No pain   Body mass index is 32.08 kg/m.  Gen: resting comfortably, no acute distress HEENT: no scleral icterus, pupils equal round and reactive, no palptable cervical adenopathy,  CV: RRR, 3/6 systolic murmur rusb, no jvd Resp: Clear to auscultation bilaterally GI: abdomen is soft, non-tender, non-distended, normal bowel sounds, no hepatosplenomegaly MSK: extremities are warm, no edema.  Skin: warm, no rash Neuro:  no focal deficits Psych: appropriate affect   Diagnostic Studies  Echo: 12/2019 Summary 1. Technically difficult study due to body habitus. 2. Echo contrast utilized to enhance endocardial border definition. 3. The left ventricle is mildly to moderately dilated in size with upper normal wall thickness. 4. The left ventricular systolic  function is severely decreased, LVEF is visually estimated at 20-25%. 5. There is moderate mitral valve regurgitation. 6. There is mild to moderate aortic valve stenosis. 7. There is mild aortic regurgitation. 8. The left atrium is moderately dilated in size. 9. The right ventricle is normal in size, with normal systolic function. 10. The right atrium is mildly dilated in size.   02/2020 RHC/LHC Prox RCA to Mid RCA lesion is 50% stenosed. Mid RCA lesion is 100% stenosed. SVG graft was visualized by angiography and is normal in caliber. Ost LAD to Prox LAD lesion is 100% stenosed. SVG graft was visualized by  angiography and is normal in caliber. Prox Cx lesion is 100% stenosed. Mid LAD to Dist LAD lesion is 50% stenosed.   1. Severe triple vessel CAD s/p 5V CABG with 5/5 patent bypass grafts. 2. The LAD is occluded proximally. The LAD and diagonal fill from the patent vein graft. The LIMA does not appear to have been used for bypass. 3. The Circumflex is occluded in the mid vessel. The patent sequential vein graft fills the proximal and distal OM branches 4. The RCA is occluded in the mid to distal vessel. The distal RCA/PDA and PLA fills from left to right collaterals and from the patent vein graft 5. Normal filling pressures.   Recommendations: Continue medical management of CAD.    11/2019 [facility] Zio patch: min HR 57, Max HR 255, Avg HF 101.4.8% PVCs. Rare supraventricular ectopy, some runs of atach. Short runs of NSVT   03/2020 [facility]RI IMPRESSION: 1. Mild LV dilatation with severe systolic dysfunction (EF 26%). 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 Hypokinesis of mid anterior/anterolateral walls and apical anterior/septal/lateral walls. Akinesis of mid inferolateral wall   2. Subendocardial late gadolinium enhancement consistent with prior infarcts in LAD and LCX territories. LGE is <50% transmural suggesting viability in mid anterior/anterolateral walls and  apical septal/anterior/inferior/lateral walls. LGE >50% transmural suggesting nonviability at apex. In addition, there is significant thinning in mid inferolateral wall (wall thickness 4mm), suggesting nonviability.   3.  Normal RV size and systolic function (EF 50%)   4.  Mild mitral regurgitation (regurgitant fraction 19%)     01/2021 echo 1. Left ventricular ejection fraction, by estimation, is 40 to 45%. The  left ventricle has mildly decreased function. The left ventricle  demonstrates regional wall motion abnormalities (see scoring  diagram/findings for description). The left ventricular   internal cavity size was mildly to moderately dilated. Left ventricular  diastolic parameters are consistent with Grade I diastolic dysfunction  (impaired relaxation). Reduced global longitudinal strain of -10.3%. Would  consider future studies with  Definity contrast.   2. Right ventricular systolic function is normal. The right ventricular  size is normal. Tricuspid regurgitation signal is inadequate for assessing  PA pressure.   3. Left atrial size was mildly dilated.   4. The mitral valve is grossly normal. Mild to moderate mitral valve  regurgitation.   5. The aortic valve has an indeterminant number of cusps. There is  moderate calcification of the aortic valve. Aortic valve regurgitation is  mild. Moderate aortic valve stenosis. Aortic regurgitation PHT measures  758 msec. Aortic valve mean gradient  measures 18.0 mmHg. Dimentionless index 0.36.   6. The inferior vena cava is normal in size with greater than 50%  respiratory variability, suggesting right atrial pressure of 3 mmHg.    01/2022 echo  1. Acoustic shadowing at LV apex - cannot exclude relatively large,  rounded mural thormbus. Suggest Definity contrast study to clarify.   2. Left ventricular ejection fraction, by estimation, is 40 to 45%. The  left ventricle has mildly decreased function. The left ventricle   demonstrates regional wall motion abnormalities (see scoring  diagram/findings for description). The left ventricular   internal cavity size was mildly dilated. Left ventricular diastolic  parameters are consistent with Grade II diastolic dysfunction  (pseudonormalization). Elevated left ventricular end-diastolic pressure.  The average left ventricular global longitudinal  strain is -11.7 %. The global longitudinal strain is abnormal.   3. Right ventricular systolic function is normal. The right ventricular  size is normal. The estimated right  ventricular systolic pressure is 19.6  mmHg.   4. Left atrial size was mildly dilated.   5. The mitral valve is degenerative. Mild to moderate mitral valve  regurgitation.   6. The aortic valve is tricuspid. There is moderate calcification of the  aortic valve. Aortic valve regurgitation is mild. Moderate aortic valve  stenosis. Aortic regurgitation PHT measures 416 msec. Aortic valve mean  gradient measures 20.0 mmHg.  Dimentionless index 0.31.   7. The inferior vena cava is normal in size with greater than 50%  respiratory variability, suggesting right atrial pressure of 3 mmHg.      02/2022 echo 1. Left ventricular ejection fraction, by estimation, is 40 to 45%. The  left ventricle has mildly decreased function. The left ventricle  demonstrates regional wall motion abnormalities (see scoring  diagram/findings for description).   2. No apical thrombus seen      Assessment and Plan   1. Chronic systolic HF - routine echo to follow his aortic valve shows his LVEF has declined - prevously 40-45%, now down to 30-35% - he denies any new significant symptoms though sedentary lifestyle  - given history of CAD with prior CABG will plan for cath to reevalaute his coronary anatomy - his aortic valve meets criteria for low flow low gradient severe AS which is another possible etiology of EF drop. At time of cath would assess aortic gradient, may  need to consider CT aortic valve calcium score vs dobutamine echo.  - would plan for RHC/LHC with aortic valve gradients when patient calls [place] with his available dates  - hyperkalemia on aldactone, then recurrent hyperkalemia on entresto. Off both medications, at this time avoiding ACE/ARB/ARNI/MRAs      2. CAD - 2021 cath with patent grafts -no recent symptoms but with drop in LVEF would plan cath. Patient to call us back when know his schedule.    3. Aortic stenosis - is aortic valve meets criteria for low flow low gradient severe AS which is another possible etiology of EF drop. At time of cath would assess aortic gradient, may need to consider CT aortic valve calcium score vs dobutamine echo     Antoine Poche, M.D.

## 2023-03-10 NOTE — Patient Instructions (Addendum)
Medication Instructions:   Crestor 20 mg refilled today  Continue all other medications.     Labwork:  none  Testing/Procedures:  Your physician has requested that you have a cardiac catheterization. Cardiac catheterization is used to diagnose and/or treat various heart conditions. Doctors may recommend this procedure for a number of different reasons. The most common reason is to evaluate chest pain. Chest pain can be a symptom of coronary artery disease (CAD), and cardiac catheterization can show whether plaque is narrowing or blocking your heart's arteries. This procedure is also used to evaluate the valves, as well as measure the blood flow and oxygen levels in different parts of your heart. For further information please visit [email or web address] Please follow instruction sheet, as given.  PATIENT WILL CALL BACK WITH DATE HE CAN GO   Follow-Up:  1 month after cath   Any Other Special Instructions Will Be Listed Below (If Applicable).   If you need a refill on your cardiac medications before your next appointment, please call your pharmacy.

## 2023-03-23 ENCOUNTER — Encounter: Payer: Self-pay | Admitting: *Deleted

## 2023-03-23 ENCOUNTER — Other Ambulatory Visit: Payer: Self-pay | Admitting: *Deleted

## 2023-03-23 ENCOUNTER — Telehealth: Payer: Self-pay | Admitting: Cardiovascular Disease

## 2023-03-23 DIAGNOSIS — I10 Essential (primary) hypertension: Secondary | ICD-10-CM

## 2023-03-23 DIAGNOSIS — I5022 Chronic systolic (congestive) heart failure: Secondary | ICD-10-CM

## 2023-03-23 DIAGNOSIS — Z01812 Encounter for preprocedural laboratory examination: Secondary | ICD-10-CM

## 2023-03-23 NOTE — Telephone Encounter (Signed)
Checking percert on the following patient for   LEFT & RIGHT HEART CATH   03/30/2023

## 2023-03-25 ENCOUNTER — Other Ambulatory Visit (HOSPITAL_COMMUNITY)
Admission: RE | Admit: 2023-03-25 | Discharge: 2023-03-25 | Disposition: A | Discharge status: Home / Self Care | Payer: PPO | Source: Ambulatory Visit | Attending: Cardiology | Admitting: Cardiology

## 2023-03-25 DIAGNOSIS — Z01812 Encounter for preprocedural laboratory examination: Secondary | ICD-10-CM | POA: Insufficient documentation

## 2023-03-25 DIAGNOSIS — I10 Essential (primary) hypertension: Secondary | ICD-10-CM | POA: Insufficient documentation

## 2023-03-25 DIAGNOSIS — I5022 Chronic systolic (congestive) heart failure: Secondary | ICD-10-CM | POA: Insufficient documentation

## 2023-03-25 LAB — BASIC METABOLIC PANEL
Anion gap: 8 (ref 5–15)
BUN: 24 mg/dL — ABNORMAL HIGH (ref 8–23)
CO2: 25 mmol/L (ref 22–32)
Calcium: 9.3 mg/dL (ref 8.9–10.3)
Chloride: 104 mmol/L (ref 98–111)
Creatinine, Ser: 1.06 mg/dL (ref 0.61–1.24)
GFR, Estimated: >60 mL/min (ref >60)
Glucose, Bld: 202 mg/dL — ABNORMAL HIGH (ref 70–99)
Potassium: 5.3 mmol/L — ABNORMAL HIGH (ref 3.5–5.1)
Sodium: 137 mmol/L (ref 135–145)

## 2023-03-25 LAB — CBC
HCT: 48.5 % (ref 39.0–52.0)
Hemoglobin: 15.4 g/dL (ref 13.0–17.0)
MCH: 30.3 pg (ref 26.0–34.0)
MCHC: 31.8 g/dL (ref 30.0–36.0)
MCV: 95.3 fL (ref 80.0–100.0)
Platelets: 213 10*3/uL (ref 150–400)
RBC: 5.09 MIL/uL (ref 4.22–5.81)
RDW: 13.6 % (ref 11.5–15.5)
WBC: 10.5 10*3/uL (ref 4.0–10.5)
nRBC: 0 % (ref 0.0–0.2)

## 2023-03-26 ENCOUNTER — Telehealth: Payer: Self-pay | Admitting: *Deleted

## 2023-03-26 NOTE — Telephone Encounter (Addendum)
Cardiac Catheterization scheduled at [facility] for: Monday March 30, 2023 7:30 AM Arrival time [facility] Main Entrance A at: 5:30 AM  Nothing to eat after midnight prior to procedure, clear liquids until 5 AM day of procedure.   Medication instructions: -Hold:  Metformin-day of procedure and 48 hours post procedure  Glipizide/Jardiance-AM of procedure  Lasix-AM of procedure -Other usual morning medications can be taken with sips of water including aspirin 81 mg.  Plan to go home the same day, you will only stay overnight if medically necessary.  You must have responsible adult to drive you home.  Someone must be with you the first 24 hours after you arrive home.  Reviewed procedure instructions with patient.

## 2023-03-30 ENCOUNTER — Encounter (HOSPITAL_COMMUNITY)
Admission: RE | Disposition: A | Discharge status: Home / Self Care | Payer: Self-pay | Source: Home / Self Care | Attending: Cardiovascular Disease

## 2023-03-30 ENCOUNTER — Ambulatory Visit (HOSPITAL_COMMUNITY)
Admission: RE | Admit: 2023-03-30 | Discharge: 2023-03-30 | Disposition: A | Discharge status: Home / Self Care | Payer: PPO | Attending: Cardiovascular Disease | Admitting: Cardiovascular Disease

## 2023-03-30 ENCOUNTER — Other Ambulatory Visit: Payer: Self-pay

## 2023-03-30 DIAGNOSIS — I420 Dilated cardiomyopathy: Secondary | ICD-10-CM | POA: Diagnosis not present

## 2023-03-30 DIAGNOSIS — Z79899 Other long term (current) drug therapy: Secondary | ICD-10-CM | POA: Insufficient documentation

## 2023-03-30 DIAGNOSIS — E785 Hyperlipidemia, unspecified: Secondary | ICD-10-CM | POA: Insufficient documentation

## 2023-03-30 DIAGNOSIS — I110 Hypertensive heart disease with heart failure: Secondary | ICD-10-CM | POA: Insufficient documentation

## 2023-03-30 DIAGNOSIS — I2582 Chronic total occlusion of coronary artery: Secondary | ICD-10-CM | POA: Diagnosis not present

## 2023-03-30 DIAGNOSIS — I350 Nonrheumatic aortic (valve) stenosis: Secondary | ICD-10-CM

## 2023-03-30 DIAGNOSIS — I5022 Chronic systolic (congestive) heart failure: Secondary | ICD-10-CM | POA: Insufficient documentation

## 2023-03-30 DIAGNOSIS — I255 Ischemic cardiomyopathy: Secondary | ICD-10-CM | POA: Insufficient documentation

## 2023-03-30 DIAGNOSIS — I2510 Atherosclerotic heart disease of native coronary artery without angina pectoris: Secondary | ICD-10-CM | POA: Insufficient documentation

## 2023-03-30 DIAGNOSIS — Z951 Presence of aortocoronary bypass graft: Secondary | ICD-10-CM | POA: Diagnosis not present

## 2023-03-30 DIAGNOSIS — I471 Supraventricular tachycardia, unspecified: Secondary | ICD-10-CM | POA: Diagnosis not present

## 2023-03-30 HISTORY — PX: RIGHT/LEFT HEART CATH AND CORONARY/GRAFT ANGIOGRAPHY: CATH118267

## 2023-03-30 LAB — POCT I-STAT EG7
Acid-base deficit: 3 mmol/L — ABNORMAL HIGH (ref 0.0–2.0)
Bicarbonate: 22.1 mmol/L (ref 20.0–28.0)
Calcium, Ion: 1.19 mmol/L (ref 1.15–1.40)
HCT: 45 % (ref 39.0–52.0)
Hemoglobin: 15.3 g/dL (ref 13.0–17.0)
O2 Saturation: 63 %
Potassium: 4.4 mmol/L (ref 3.5–5.1)
Sodium: 140 mmol/L (ref 135–145)
TCO2: 23 mmol/L (ref 22–32)
pCO2, Ven: 39.4 mmHg — ABNORMAL LOW (ref 44–60)
pH, Ven: 7.357 (ref 7.25–7.43)
pO2, Ven: 34 mmHg (ref 32–45)

## 2023-03-30 LAB — POCT I-STAT 7, (LYTES, BLD GAS, ICA,H+H)
Acid-base deficit: 5 mmol/L — ABNORMAL HIGH (ref 0.0–2.0)
Bicarbonate: 18.6 mmol/L — ABNORMAL LOW (ref 20.0–28.0)
Calcium, Ion: 1.05 mmol/L — ABNORMAL LOW (ref 1.15–1.40)
HCT: 42 % (ref 39.0–52.0)
Hemoglobin: 14.3 g/dL (ref 13.0–17.0)
O2 Saturation: 94 %
Potassium: 4.1 mmol/L (ref 3.5–5.1)
Sodium: 142 mmol/L (ref 135–145)
TCO2: 19 mmol/L — ABNORMAL LOW (ref 22–32)
pCO2 arterial: 30 mmHg — ABNORMAL LOW (ref 32–48)
pH, Arterial: 7.401 (ref 7.35–7.45)
pO2, Arterial: 68 mmHg — ABNORMAL LOW (ref 83–108)

## 2023-03-30 LAB — GLUCOSE, CAPILLARY
Glucose-Capillary: 142 mg/dL — ABNORMAL HIGH (ref 70–99)
Glucose-Capillary: 126 mg/dL — ABNORMAL HIGH (ref 70–99)

## 2023-03-30 SURGERY — RIGHT/LEFT HEART CATH AND CORONARY/GRAFT ANGIOGRAPHY
Anesthesia: LOCAL

## 2023-03-30 MED ORDER — SODIUM CHLORIDE 0.9% FLUSH: 3.0000 mL | INTRAVENOUS | Status: DC | PRN

## 2023-03-30 MED ORDER — SODIUM CHLORIDE 0.9 % IV SOLN: INTRAVENOUS | Status: DC

## 2023-03-30 MED ORDER — HEPARIN SODIUM (PORCINE) 1000 UNIT/ML IJ SOLN
INTRAMUSCULAR | Status: AC
  Filled 2023-03-30: qty 10

## 2023-03-30 MED ORDER — LIDOCAINE HCL (PF) 1 % IJ SOLN
INTRAMUSCULAR | Status: DC | PRN
  Administered 2023-03-30 (×2): 2 mL

## 2023-03-30 MED ORDER — MIDAZOLAM HCL 2 MG/2ML IJ SOLN
INTRAMUSCULAR | Status: AC
  Filled 2023-03-30: qty 2

## 2023-03-30 MED ORDER — FENTANYL CITRATE (PF) 100 MCG/2ML IJ SOLN
INTRAMUSCULAR | Status: AC
  Filled 2023-03-30: qty 2

## 2023-03-30 MED ORDER — SODIUM CHLORIDE 0.9 % IV SOLN: INTRAVENOUS | Status: AC

## 2023-03-30 MED ORDER — ASPIRIN 81 MG PO CHEW: 81.0000 mg | CHEWABLE_TABLET | ORAL | Status: DC

## 2023-03-30 MED ORDER — FENTANYL CITRATE (PF) 100 MCG/2ML IJ SOLN
INTRAMUSCULAR | Status: DC | PRN
  Administered 2023-03-30: 25 ug via INTRAVENOUS

## 2023-03-30 MED ORDER — VERAPAMIL HCL 2.5 MG/ML IV SOLN
INTRAVENOUS | Status: DC | PRN
  Administered 2023-03-30: 10 mL via INTRA_ARTERIAL

## 2023-03-30 MED ORDER — HEPARIN SODIUM (PORCINE) 1000 UNIT/ML IJ SOLN
INTRAMUSCULAR | Status: DC | PRN
  Administered 2023-03-30: 5000 [IU] via INTRAVENOUS

## 2023-03-30 MED ORDER — ONDANSETRON HCL 4 MG/2ML IJ SOLN: 4.0000 mg | Freq: Four times a day (QID) | INTRAMUSCULAR | Status: DC | PRN

## 2023-03-30 MED ORDER — LABETALOL HCL 5 MG/ML IV SOLN: 10.0000 mg | INTRAVENOUS | Status: DC | PRN

## 2023-03-30 MED ORDER — SODIUM CHLORIDE 0.9% FLUSH: 3.0000 mL | Freq: Two times a day (BID) | INTRAVENOUS | Status: DC

## 2023-03-30 MED ORDER — MIDAZOLAM HCL 2 MG/2ML IJ SOLN
INTRAMUSCULAR | Status: DC | PRN
  Administered 2023-03-30 (×2): .5 mg via INTRAVENOUS

## 2023-03-30 MED ORDER — SODIUM CHLORIDE 0.9 % IV SOLN: 250.0000 mL | INTRAVENOUS | Status: DC | PRN

## 2023-03-30 MED ORDER — HYDRALAZINE HCL 20 MG/ML IJ SOLN: 10.0000 mg | INTRAMUSCULAR | Status: DC | PRN

## 2023-03-30 MED ORDER — HEPARIN (PORCINE) IN NACL 1000-0.9 UT/500ML-% IV SOLN
INTRAVENOUS | Status: DC | PRN
  Administered 2023-03-30 (×2): 500 mL

## 2023-03-30 MED ORDER — LIDOCAINE HCL (PF) 1 % IJ SOLN
INTRAMUSCULAR | Status: AC
  Filled 2023-03-30: qty 30

## 2023-03-30 MED ORDER — VERAPAMIL HCL 2.5 MG/ML IV SOLN
INTRAVENOUS | Status: AC
  Filled 2023-03-30: qty 2

## 2023-03-30 MED ORDER — IOHEXOL 350 MG/ML SOLN
INTRAVENOUS | Status: DC | PRN
  Administered 2023-03-30: 30 mL

## 2023-03-30 MED ORDER — ACETAMINOPHEN 325 MG PO TABS: 650.0000 mg | ORAL_TABLET | ORAL | Status: DC | PRN

## 2023-03-30 SURGICAL SUPPLY — 11 items
CATH BALLN WEDGE 5F 110CM (CATHETERS) IMPLANT
CATH INFINITI 5FR MULTPACK ANG (CATHETERS) IMPLANT
DEVICE RAD COMP TR BAND LRG (VASCULAR PRODUCTS) IMPLANT
GLIDESHEATH SLEND SS 6F .021 (SHEATH) IMPLANT
GUIDEWIRE INQWIRE 1.5J.035X260 (WIRE) IMPLANT
INQWIRE 1.5J .035X260CM (WIRE) ×1
KIT SYRINGE INJ CVI SPIKEX1 (MISCELLANEOUS) IMPLANT
PACK CARDIAC CATHETERIZATION (CUSTOM PROCEDURE TRAY) ×2 IMPLANT
SET ATX-X65L (MISCELLANEOUS) IMPLANT
SHEATH GLIDE SLENDER 4/5FR (SHEATH) IMPLANT
SHEATH PROBE COVER 6X72 (BAG) IMPLANT

## 2023-03-30 NOTE — Interval H&P Note (Signed)
History and Physical Interval Note:  03/30/2023 7:26 AM  Kenneth Randall  has presented today for surgery, with the diagnosis of heart failure - aortic stenosis.  The various methods of treatment have been discussed with the patient and family. After consideration of risks, benefits and other options for treatment, the patient has consented to  Procedure(s): RIGHT/LEFT HEART CATH AND CORONARY/GRAFT ANGIOGRAPHY (N/A) as a surgical intervention.  The patient's history has been reviewed, patient examined, no change in status, stable for surgery.  I have reviewed the patient's chart and labs.  Questions were answered to the patient's satisfaction.    Cath Lab Visit (complete for each Cath Lab visit)  Clinical Evaluation Leading to the Procedure:   ACS: No.  Non-ACS:    Anginal Classification: No Symptoms  Anti-ischemic medical therapy: Maximal Therapy (2 or more classes of medications)  Non-Invasive Test Results: No non-invasive testing performed  Prior CABG: Previous CABG        Verne Carrow

## 2023-03-30 NOTE — Progress Notes (Signed)
Patient and wife was given discharge instructions. Both verbalized understanding. 

## 2023-03-30 NOTE — Discharge Instructions (Signed)

## 2023-03-31 ENCOUNTER — Encounter (HOSPITAL_COMMUNITY): Payer: Self-pay | Admitting: Cardiovascular Disease

## 2023-04-01 ENCOUNTER — Encounter: Payer: Self-pay | Admitting: Surgery

## 2023-04-01 ENCOUNTER — Ambulatory Visit (HOSPITAL_COMMUNITY)
Admission: RE | Admit: 2023-04-01 | Discharge: 2023-04-01 | Disposition: A | Discharge status: Home / Self Care | Payer: PPO | Source: Ambulatory Visit

## 2023-04-01 DIAGNOSIS — I253 Aneurysm of heart: Secondary | ICD-10-CM | POA: Diagnosis not present

## 2023-04-01 DIAGNOSIS — K639 Disease of intestine, unspecified: Secondary | ICD-10-CM | POA: Diagnosis not present

## 2023-04-01 DIAGNOSIS — I350 Nonrheumatic aortic (valve) stenosis: Secondary | ICD-10-CM | POA: Insufficient documentation

## 2023-04-01 DIAGNOSIS — Z951 Presence of aortocoronary bypass graft: Secondary | ICD-10-CM | POA: Insufficient documentation

## 2023-04-01 DIAGNOSIS — I2510 Atherosclerotic heart disease of native coronary artery without angina pectoris: Secondary | ICD-10-CM | POA: Insufficient documentation

## 2023-04-01 DIAGNOSIS — Z01818 Encounter for other preprocedural examination: Secondary | ICD-10-CM | POA: Diagnosis not present

## 2023-04-01 DIAGNOSIS — I700 Atherosclerosis of aorta: Secondary | ICD-10-CM | POA: Diagnosis not present

## 2023-04-01 DIAGNOSIS — R590 Localized enlarged lymph nodes: Secondary | ICD-10-CM | POA: Diagnosis not present

## 2023-04-01 DIAGNOSIS — K4090 Unilateral inguinal hernia, without obstruction or gangrene, not specified as recurrent: Secondary | ICD-10-CM | POA: Insufficient documentation

## 2023-04-01 DIAGNOSIS — Z01810 Encounter for preprocedural cardiovascular examination: Secondary | ICD-10-CM | POA: Diagnosis not present

## 2023-04-01 MED ORDER — METOPROLOL TARTRATE 5 MG/5ML IV SOLN: 10.0000 mg | Freq: Once | INTRAVENOUS | Status: DC

## 2023-04-01 MED ORDER — DILTIAZEM HCL 25 MG/5ML IV SOLN
INTRAVENOUS | Status: AC
  Filled 2023-04-01: qty 5

## 2023-04-01 MED ORDER — IOHEXOL 350 MG/ML SOLN
95.0000 mL | Freq: Once | INTRAVENOUS | Status: AC | PRN
  Administered 2023-04-01: 95 mL via INTRAVENOUS

## 2023-04-01 MED ORDER — DILTIAZEM HCL 25 MG/5ML IV SOLN
5.0000 mg | Freq: Once | INTRAVENOUS | Status: AC
  Administered 2023-04-01: 5 mg via INTRAVENOUS

## 2023-04-01 MED ORDER — METOPROLOL TARTRATE 5 MG/5ML IV SOLN
INTRAVENOUS | Status: AC
  Filled 2023-04-01: qty 5

## 2023-04-01 MED ORDER — METOPROLOL TARTRATE 5 MG/5ML IV SOLN
5.0000 mg | Freq: Once | INTRAVENOUS | Status: AC
  Administered 2023-04-01: 5 mg via INTRAVENOUS

## 2023-04-01 MED ORDER — METOPROLOL TARTRATE 5 MG/5ML IV SOLN
2.5000 mg | Freq: Once | INTRAVENOUS | Status: AC
  Administered 2023-04-01: 2.5 mg via INTRAVENOUS

## 2023-04-02 ENCOUNTER — Institutional Professional Consult (permissible substitution): Payer: PPO | Admitting: Surgery

## 2023-04-02 ENCOUNTER — Encounter: Payer: Self-pay | Admitting: Surgery

## 2023-04-02 VITALS — BP 124/69 | HR 94 | Resp 20 | Ht 70.0 in | Wt 220.0 lb

## 2023-04-02 DIAGNOSIS — I350 Nonrheumatic aortic (valve) stenosis: Secondary | ICD-10-CM

## 2023-04-02 NOTE — Progress Notes (Signed)
Pre Surgical Assessment: 5 M Walk Test  36M=16.7ft  5 Meter Walk Test- trial 1: 9.55 seconds 5 Meter Walk Test- trial 2: 10.33 seconds 5 Meter Walk Test- trial 3: 12.38 seconds 5 Meter Walk Test Average: 10.75 seconds

## 2023-04-02 NOTE — Progress Notes (Signed)
Patient ID: Kenneth Randall, male   DOB: 03-06-1946, 77 y.o.   MRN: [id number]  [facility]       [address]       [place] [address]             [phone]          CARDIOTHORACIC SURGERY CONSULTATION REPORT  PCP is Selinda Flavin, MD Referring Provider is Verne Carrow, MD Primary Cardiologist is Dina Rich, MD  Reason for consultation: Severe aortic stenosis and moderate to severe aortic insufficiency  HPI:  The patient is a 77 year old gentleman with a history of hypertension, hyperlipidemia, coronary artery disease status post CABG x 5 by me in 1999, Chronic systolic HF/ICM with an EF of 20 to 25% by echo at [facility] in 12/2019, and aortic stenosis.  When he presented in 2021 with significant volume overload he was treated with diuresis and started on medical therapy including Entresto.  His ejection fraction increased to 40 to 45% but he developed hyperkalemia and the Entresto had to be stopped.  An echocardiogram in July 2023 showed an ejection fraction of 40 to 45% with apical akinesis.  2D echocardiogram in July 2024 showed a drop in his ejection fraction to 30 to 35% with grade 2 diastolic dysfunction.  The mean gradient across the calcified aortic valve was 20 mmHg with a valve area by VTI of 0.97 cm and a dimensionless index of 0.25.  There is moderate to severe aortic insufficiency.  This was felt to be consistent with moderate to severe low-flow/low gradient aortic stenosis.  He is here today with his wife.  He is fairly sedentary and still trying to recover after a broken left femur last year.  He does mow his grass with a riding lawnmower and uses a weed eater.  He reports some exertional fatigue but denies any shortness of breath with exertion.  He denies any chest pain or pressure.  Has had no dizziness or syncope.  He denies orthopnea.  He has had lower extremity edema that is worse on the right  than the left and improved with Lasix.  Past Medical History:  Diagnosis Date   CAD (coronary artery disease)    Cancer (HCC)    CHF (congestive heart failure) (HCC)    Hypertension    Ischemic cardiomyopathy    PSVT (paroxysmal supraventricular tachycardia)     Past Surgical History:  Procedure Laterality Date   RIGHT/LEFT HEART CATH AND CORONARY/GRAFT ANGIOGRAPHY N/A 02/08/2020   Procedure: RIGHT/LEFT HEART CATH AND CORONARY/GRAFT ANGIOGRAPHY;  Surgeon: Kathleene Hazel, MD;  Location: [facility];  Service: Cardiovascular;  Laterality: N/A;   RIGHT/LEFT HEART CATH AND CORONARY/GRAFT ANGIOGRAPHY N/A 03/30/2023   Procedure: RIGHT/LEFT HEART CATH AND CORONARY/GRAFT ANGIOGRAPHY;  Surgeon: Kathleene Hazel, MD;  Location: [facility];  Service: Cardiovascular;  Laterality: N/A;   SKIN CANCER EXCISION      Family History  Problem Relation Age of Onset   Heart attack Mother 1   Rheum arthritis Mother    Diabetes Father    Heart disease Father        died after bypass surgery   Cancer Brother        jaw    Stroke Paternal Grandmother     Social History   Socioeconomic History   Marital status: Married    Spouse name: Not on file   Number of children: Not on  file   Years of education: Not on file   Highest education level: Not on file  Occupational History   Not on file  Tobacco Use   Smoking status: Former    Current packs/day: 0.00    Types: Cigarettes    Quit date: 01/17/1990    Years since quitting: 33.2   Smokeless tobacco: Never  Vaping Use   Vaping status: Never Used  Substance and Sexual Activity   Alcohol use: Yes    Comment: rarely - beer   Drug use: Never   Sexual activity: Not on file  Other Topics Concern   Not on file  Social History Narrative   Not on file   Social Determinants of Health   Financial Resource Strain: Not on file  Food Insecurity: Not on file  Transportation Needs: Not on file  Physical Activity: Not  on file  Stress: Not on file  Social Connections: Not on file  Intimate Partner Violence: Not on file    Prior to Admission medications   Medication Sig Start Date End Date Taking? Authorizing Provider  acetaminophen (TYLENOL) 650 MG CR tablet Take 1,300 mg by mouth every 8 (eight) hours as needed for pain.   Yes [provider]  aspirin EC 81 MG tablet Take 1 tablet (81 mg total) by mouth daily. Swallow whole. 04/02/20  Yes Netta Neat., NP  Coenzyme Q10 (COQ10) 100 MG CAPS Take 100 mg by mouth daily.   Yes [provider]  furosemide (LASIX) 20 MG tablet TAKE 1 TABLET (20 MG TOTAL) BY MOUTH DAILY. MAY TAKE ADDITIONAL 20 MG AS NEEDED FOR SWELLING 02/10/23  Yes Branch, Dorothe Pea, MD  glipiZIDE (GLUCOTROL XL) 5 MG 24 hr tablet Take 5 mg by mouth daily. 04/15/22  Yes [provider]  hydrALAZINE (APRESOLINE) 25 MG tablet TAKE 1 TABLET BY MOUTH TWICE A DAY 03/05/23  Yes Jonelle Sidle, MD  isosorbide mononitrate (IMDUR) 30 MG 24 hr tablet TAKE 1/2 OF A TABLET (15 MG TOTAL) BY MOUTH DAILY 05/13/22  Yes Branch, Dorothe Pea, MD  JARDIANCE 10 MG TABS tablet Take 10 mg by mouth daily. 04/13/21  Yes [provider]  levothyroxine (SYNTHROID) 50 MCG tablet Take 50 mcg by mouth daily. 03/24/22  Yes [provider]  metFORMIN (GLUCOPHAGE-XR) 500 MG 24 hr tablet Take 500 mg by mouth 2 (two) times daily.   Yes [provider]  metoprolol succinate (TOPROL-XL) 100 MG 24 hr tablet TAKE 1.5 TABLETS (150 MG TOTAL) BY MOUTH DAILY. TAKE WITH OR IMMEDIATELY FOLLOWING A MEAL. 08/08/22  Yes Branch, Dorothe Pea, MD  rosuvastatin (CRESTOR) 20 MG tablet Take 1 tablet (20 mg total) by mouth daily. 03/10/23  Yes BranchDorothe Pea, MD    Current Outpatient Medications  Medication Sig Dispense Refill   acetaminophen (TYLENOL) 650 MG CR tablet Take 1,300 mg by mouth every 8 (eight) hours as needed for pain.     aspirin EC 81 MG tablet Take 1 tablet (81 mg total) by  mouth daily. Swallow whole.     Coenzyme Q10 (COQ10) 100 MG CAPS Take 100 mg by mouth daily.     furosemide (LASIX) 20 MG tablet TAKE 1 TABLET (20 MG TOTAL) BY MOUTH DAILY. MAY TAKE ADDITIONAL 20 MG AS NEEDED FOR SWELLING 90 tablet 2   glipiZIDE (GLUCOTROL XL) 5 MG 24 hr tablet Take 5 mg by mouth daily.     hydrALAZINE (APRESOLINE) 25 MG tablet TAKE 1 TABLET BY MOUTH TWICE  A DAY 180 tablet 1   isosorbide mononitrate (IMDUR) 30 MG 24 hr tablet TAKE 1/2 OF A TABLET (15 MG TOTAL) BY MOUTH DAILY 45 tablet 3   JARDIANCE 10 MG TABS tablet Take 10 mg by mouth daily.     levothyroxine (SYNTHROID) 50 MCG tablet Take 50 mcg by mouth daily.     metFORMIN (GLUCOPHAGE-XR) 500 MG 24 hr tablet Take 500 mg by mouth 2 (two) times daily.     metoprolol succinate (TOPROL-XL) 100 MG 24 hr tablet TAKE 1.5 TABLETS (150 MG TOTAL) BY MOUTH DAILY. TAKE WITH OR IMMEDIATELY FOLLOWING A MEAL. 135 tablet 3   rosuvastatin (CRESTOR) 20 MG tablet Take 1 tablet (20 mg total) by mouth daily. 90 tablet 1   No current facility-administered medications for this visit.    No Known Allergies    Review of Systems:   General:  normal appetite, + decreased energy, no weight gain, no weight loss, no fever  Cardiac:  no chest pain with exertion, no chest pain at rest, no SOB with mild exertion, no resting SOB, no PND, no orthopnea, no palpitations, no arrhythmia, no atrial fibrillation, + LE edema, no dizzy spells, no syncope  Respiratory:  no shortness of breath, no home oxygen, no productive cough, no dry cough, no bronchitis, no wheezing, no hemoptysis, no asthma, no pain with inspiration or cough, no sleep apnea, no CPAP at night  GI:   no difficulty swallowing, no reflux, no frequent heartburn, no hiatal hernia, no abdominal pain, no constipation, no diarrhea, no hematochezia, no hematemesis, no melena  GU:   no dysuria,  no frequency, no urinary tract infection, no hematuria, no enlarged prostate, no kidney stones, no kidney  disease  Vascular:  no pain suggestive of claudication, no pain in feet, no leg cramps, no varicose veins, no DVT, no non-healing foot ulcer  Neuro:   no stroke, no TIA's, no seizures, no headaches, no temporary blindness one eye,  no slurred speech, no peripheral neuropathy, no chronic pain, no instability of gait, no memory/cognitive dysfunction  Musculoskeletal: + arthritis, no joint swelling, no myalgias, no difficulty walking, + reduced mobility and uses a cane.  Skin:   no rash, no itching, no skin infections, no pressure sores or ulcerations  Psych:   no anxiety, no depression, no nervousness, no unusual recent stress  Eyes:   no blurry vision, no floaters, no recent vision changes, no glasses or contacts  ENT:   no hearing loss, no loose or painful teeth, no dentures, last saw dentist 04/2021  Hematologic:  no easy bruising, no abnormal bleeding, no clotting disorder, no frequent epistaxis  Endocrine:  + diabetes, does check CBG's at home     Physical Exam:   BP 124/69   Pulse 94   Resp 20   Ht 5\' 10"  (1.778 m)   Wt 220 lb (99.8 kg)   SpO2 96% Comment: RA  BMI 31.57 kg/m   General:  Obese gentleman,  well-appearing  HEENT:  Unremarkable, NCAT, PERLA, EOMI  Neck:   no JVD, no bruits, no adenopathy or thyromegaly  Chest:   clear to auscultation, symmetrical breath sounds, no wheezes, no rhonchi   CV:   RRR, 3/6 systolic murmur RSB, no diastolic murmur  Abdomen:  soft, non-tender, no masses   Extremities:  warm, well-perfused, pedal pulses not palpable, mild bilateral lower extremity edema  Rectal/GU  Deferred  Neuro:   Grossly non-focal and symmetrical throughout  Skin:   Clean and dry,  no rashes, no breakdown  Diagnostic Tests:   ECHOCARDIOGRAM REPORT       Patient Name:   RYOTARO ROMANS [facility] Date of Exam: 02/13/2023 Medical Rec #:  [id number]      Height:       69.5 in Accession #:    [id number]     Weight:       225.2 lb Date of Birth:  [birth year]     BSA:          2.184  m Patient Age:    76 years       BP:           130/70 mmHg Patient Gender: M              HR:           79 bpm. [facility]  Procedure: 2D Echo, Cardiac Doppler and Color Doppler  Indications:    Aortic stenosis I35.0   History:        Patient has prior history of Echocardiogram examinations, most                 recent 02/27/2022. Cardiomyopathy and CHF, Arrythmias:Atrial                 arrhythmia; Risk Factors:Former Smoker, Hypertension, Diabetes                 and Dyslipidemia.   Sonographer:    Celesta Gentile RCS Referring Phys: [id number] Dorothe Pea BRANCH  IMPRESSIONS    1. Left ventricular ejection fraction, by estimation, is 30 to 35%. The left ventricle has moderately decreased function. The left ventricle demonstrates global hypokinesis. The left ventricular internal cavity size was mildly dilated. Left ventricular diastolic parameters are consistent with Grade II diastolic dysfunction (pseudonormalization). Elevated left atrial pressure.  2. Right ventricular systolic function is normal. The right ventricular size is normal. Tricuspid regurgitation signal is inadequate for assessing PA pressure.  3. Left atrial size was severely dilated.  4. The mitral valve is normal in structure. Trivial mitral valve regurgitation. No evidence of mitral stenosis.  5. Moderate to severe low flow low gradient aortic stenosis. AVA VTI 0.96, mean gradient 20, DI 0.25. The aortic valve has an indeterminant number of cusps. There is moderate calcification of the aortic valve. There is moderate thickening of the aortic valve. Aortic valve regurgitation is moderate to severe.  6. The inferior vena cava is normal in size with greater than 50% respiratory variability, suggesting right atrial pressure of 3 mmHg.  FINDINGS  Left Ventricle: Global hypokinesis, the apex is akinetic. Left ventricular ejection fraction, by estimation, is 30 to 35%. The left ventricle has  moderately decreased function. The left ventricle demonstrates global hypokinesis. Definity contrast agent was given IV to delineate the left ventricular endocardial borders. The left ventricular internal cavity size was mildly dilated. There is no left ventricular hypertrophy. Left ventricular diastolic parameters are consistent with Grade II diastolic dysfunction (pseudonormalization). Elevated left atrial pressure.  Right Ventricle: The right ventricular size is normal. Right vetricular wall thickness was not well visualized. Right ventricular systolic function is normal. Tricuspid regurgitation signal is inadequate for assessing PA pressure.  Left Atrium: Left atrial size was severely dilated.  Right Atrium: Right atrial size was normal in size.  Pericardium: There is no evidence of pericardial effusion.  Mitral Valve: The mitral valve is normal in structure. Trivial mitral valve regurgitation. No evidence of mitral valve stenosis.  Tricuspid Valve: The  tricuspid valve is normal in structure. Tricuspid valve regurgitation is not demonstrated. No evidence of tricuspid stenosis.  Aortic Valve: Moderate to severe low flow low gradient aortic stenosis. AVA VTI 0.96, mean gradient 20, DI 0.25. The aortic valve has an indeterminant number of cusps. There is moderate calcification of the aortic valve. There is moderate thickening of the aortic valve. There is moderate aortic valve annular calcification. Aortic valve regurgitation is moderate to severe. Aortic valve mean gradient measures 20.0 mmHg. Aortic valve peak gradient measures 29.8 mmHg. Aortic valve area, by VTI measures 0.96 cm.  Pulmonic Valve: The pulmonic valve was not well visualized. Pulmonic valve regurgitation is not visualized. No evidence of pulmonic stenosis.  Aorta: The aortic root is normal in size and structure.  Venous: The inferior vena cava is normal in size with greater than 50% respiratory  variability, suggesting right atrial pressure of 3 mmHg.  IAS/Shunts: No atrial level shunt detected by color flow Doppler.    LEFT VENTRICLE PLAX 2D LVIDd:         6.40 cm   Diastology LVIDs:         4.90 cm   LV e' medial:    4.13 cm/s LV PW:         0.90 cm   LV E/e' medial:  26.2 LV IVS:        0.80 cm   LV e' lateral:   6.31 cm/s LVOT diam:     2.20 cm   LV E/e' lateral: 17.1 LV SV:         66 LV SV Index:   30 LVOT Area:     3.80 cm    RIGHT VENTRICLE RV S prime:     9.68 cm/s TAPSE (M-mode): 2.0 cm  LEFT ATRIUM              Index        RIGHT ATRIUM           Index LA diam:        4.50 cm  2.06 cm/m   RA Area:     20.00 cm LA Vol (A2C):   105.0 ml 48.08 ml/m  RA Volume:   54.00 ml  24.73 ml/m LA Vol (A4C):   99.9 ml  45.75 ml/m LA Biplane Vol: 107.0 ml 49.00 ml/m  AORTIC VALVE AV Area (Vmax):    1.05 cm AV Area (Vmean):   0.93 cm AV Area (VTI):     0.96 cm AV Vmax:           273.00 cm/s AV Vmean:          211.000 cm/s AV VTI:            0.687 m AV Peak Grad:      29.8 mmHg AV Mean Grad:      20.0 mmHg LVOT Vmax:         75.70 cm/s LVOT Vmean:        51.400 cm/s LVOT VTI:          0.173 m LVOT/AV VTI ratio: 0.25   AORTA Ao Root diam: 3.50 cm  MITRAL VALVE MV Area (PHT): 4.31 cm     SHUNTS MV Decel Time: 176 msec     Systemic VTI:  0.17 m MV E velocity: 108.00 cm/s  Systemic Diam: 2.20 cm MV A velocity: 88.70 cm/s MV E/A ratio:  1.22  Dina Rich MD Electronically signed by Dina Rich MD Signature Date/Time: 02/13/2023/12:08:28 PM  Final       Physicians  Panel Physicians Referring Physician Case Authorizing Physician  Kathleene Hazel, MD (Primary)     Procedures  RIGHT/LEFT HEART CATH AND CORONARY/GRAFT ANGIOGRAPHY   Conclusion      Ost LAD to Prox LAD lesion is 100% stenosed.   Mid LAD to Dist LAD lesion is 50% stenosed.   Prox Cx lesion is 100% stenosed.   Prox RCA to Mid RCA lesion is 50% stenosed.    Mid RCA lesion is 100% stenosed.   SVG and is normal in caliber.   SVG and is normal in caliber.    1. Severe triple vessel CAD s/p 5V CABG with 5/5 patent bypass grafts.  2. The LAD is known to be occluded proximally (not engaged today). The LAD and diagonal fill from the patent vein graft. The LIMA was not used for bypass.  3. The Circumflex is occluded in the proximal to mid vessel, known from prior cath and not engaged today. The patent sequential vein graft fills the proximal and distal OM branches 4. The RCA is occluded in the mid to distal vessel. The distal RCA/PDA and PLA fills from left to right collaterals and from the patent vein graft 5. Elevated right heart pressures   Recommendations: Medical management of CAD. Will continue workup for TAVR. We will arrange CT scans next and then get him in to see Dr. [name]. He will increase his Lasix to 40 mg daily.    Indications  Congestive dilated cardiomyopathy (HCC) [I42.0 (ICD-10-CM)]  Severe aortic stenosis [I35.0 (ICD-10-CM)]   Procedural Details  Technical Details Indication: Cardiomyopathy, severe low flow/low gradient aortic stenosis, CAD s/p 5V CABG  Procedure: The risks, benefits, complications, treatment options, and expected outcomes were discussed with the patient. The patient and/or family concurred with the proposed plan, giving informed consent. The patient was sedated with Versed and Fentanyl. The IV catheter in the right antecubital vein was changed for a 5 [place] sheath. Right heart catheterization performed with a balloon tipped catheter. The left wrist was prepped and draped in a sterile fashion. 1% lidocaine was used for local anesthesia. Using the modified Seldinger access technique, a 5 French sheath was placed in the left radial artery. 3 mg Verapamil was given through the sheath. Weight based IV heparin was given. Standard diagnostic catheters were used to perform selective coronary angiography. I did not engage the  left main as his LAD and circumflex are known to be occluded proximally from cath 3 years ago. I engaged the RCA and all vein grafts with the JR4 catheter. As noted in prior cath, the LIMA was not used as a graft. I did not cross the aortic valve. All catheter exchanges were performed over an exchange length guidewire.   The sheath was removed from the left radial artery and a hemostasis band was applied at the arteriotomy site on the left wrist.      Estimated blood loss <50 mL.   During this procedure medications were administered to achieve and maintain moderate conscious sedation while the patient's heart rate, blood pressure, and oxygen saturation were continuously monitored and I was present face-to-face 100% of this time.   Medications (Filter: Administrations occurring from 0739 to 0835 on 03/30/23)  important  Continuous medications are totaled by the amount administered until 03/30/23 0835.   fentaNYL (SUBLIMAZE) injection (mcg)  Total dose: 25 mcg Date/Time Rate/Dose/Volume Action   03/30/23 0742 25 mcg Given   midazolam (VERSED) injection (mg)  Total dose: 1 mg Date/Time Rate/Dose/Volume Action   03/30/23 0743 0.5 mg Given   0747 0.5 mg Given   Heparin (Porcine) in NaCl 1000-0.9 UT/500ML-% SOLN (mL)  Total volume: 1,000 mL Date/Time Rate/Dose/Volume Action   03/30/23 0745 500 mL Given   0745 500 mL Given   lidocaine (PF) (XYLOCAINE) 1 % injection (mL)  Total volume: 4 mL Date/Time Rate/Dose/Volume Action   03/30/23 0752 2 mL Given   0757 2 mL Given   Radial Cocktail/Verapamil only (mL)  Total volume: 10 mL Date/Time Rate/Dose/Volume Action   03/30/23 0804 10 mL Given   heparin sodium (porcine) injection (Units)  Total dose: 5,000 Units Date/Time Rate/Dose/Volume Action   03/30/23 0806 5,000 Units Given   iohexol (OMNIPAQUE) 350 MG/ML injection (mL)  Total volume: 30 mL Date/Time Rate/Dose/Volume Action   03/30/23 0824 30 mL Given    Sedation Time  Sedation  Time Physician-1: 34 minutes 3 seconds Contrast     Administrations occurring from 0739 to 0835 on 03/30/23:  Medication Name Total Dose  iohexol (OMNIPAQUE) 350 MG/ML injection 30 mL   Radiation/Fluoro  Fluoro time: 7.5 (min) DAP: 15872 (mGycm2) Cumulative Air Kerma: 257 (mGy) Coronary Findings  Diagnostic Dominance: Right Left Anterior Descending  Vessel is large.  Ost LAD to Prox LAD lesion is 100% stenosed. The lesion is chronically occluded.  Mid LAD to Dist LAD lesion is 50% stenosed.    Left Circumflex  Vessel is large.  Prox Cx lesion is 100% stenosed. The lesion is chronically occluded.    Right Coronary Artery  Vessel is large.  Prox RCA to Mid RCA lesion is 50% stenosed.  Mid RCA lesion is 100% stenosed. The lesion is chronically occluded.    Third Right Posterolateral Branch  Collaterals  3rd RPL filled by collaterals from 2nd Sept.      Saphenous Graft To RPDA  SVG and is normal in caliber.    Sequential Saphenous Graft To 2nd Diag, Dist LAD  SVG and is normal in caliber.    Sequential Graft To 1st Mrg, 3rd Mrg    Intervention   No interventions have been documented.   Coronary Diagrams  Diagnostic Dominance: Right  Intervention   Implants   No implant documentation for this case.   Syngo Images   Show images for CARDIAC CATHETERIZATION Images on Long Term Storage   Show images for Juriah, Lanahan "Rosanne Ashing" Link to Procedure Log  Procedure Log   Link to Procedure Log  Procedure Log    Hemo Data  Flowsheet Row Most Recent Value  Fick Cardiac Output 4.46 L/min  Fick Cardiac Output Index 2.05 (L/min)/BSA  RA A Wave 11 mmHg  RA V Wave 10 mmHg  RA Mean 8 mmHg  RV Systolic Pressure 72 mmHg  RV Diastolic Pressure 3 mmHg  RV EDP 10 mmHg  PA Systolic Pressure 67 mmHg  PA Diastolic Pressure 28 mmHg  PA Mean 43 mmHg  PW A Wave 28 mmHg  PW V Wave 45 mmHg  PW Mean 30 mmHg  AO Systolic Pressure 144 mmHg  AO Diastolic Pressure 76  mmHg  AO Mean 108 mmHg  QP/QS 1  TPVR Index 20.99 HRUI  TSVR Index 52.71 HRUI  PVR SVR Ratio 0.13  TPVR/TSVR Ratio 0.4    ADDENDUM REPORT: 04/02/2023 11:35   EXAM: OVER-READ INTERPRETATION  CT CHEST   The following report is an over-read performed by radiologist Dr. Jacob Moores [facility] on 04/02/2023. This over-read does not include  interpretation of cardiac or coronary anatomy or pathology. The cardiac TAVR interpretation by the cardiologist is attached.   COMPARISON:  None.   FINDINGS: Extracardiac findings will be described separately under dictation for contemporaneously obtained CTA chest, abdomen and pelvis.   IMPRESSION: Please see separate dictation for contemporaneously obtained CTA chest, abdomen and pelvis dated March 12, 2023 for full description of relevant extracardiac findings.     Electronically Signed   By: Allegra Lai M.D.   On: 04/02/2023 11:35    Addended by Renford Dills, MD on 04/02/2023 11:38 AM    Study Result  Narrative & Impression  CLINICAL DATA:  Aortic Valve pathology with assessment for TAVR   EXAM: Cardiac TAVR CT   TECHNIQUE: The patient was scanned on a Siemens Force 192 slice scanner. A 120 kV retrospective scan was triggered in the descending thoracic aorta at 111 HU's. Gantry rotation speed was 270 msecs and collimation was .9 mm. No beta blockade or nitro were given. The 3D data set was reconstructed in 5% intervals of the R-R cycle. Systolic and diastolic phases were analyzed on a dedicated work station using MPR, MIP and VRT modes. The patient received 95 cc of contrast.   FINDINGS: Aortic Valve: Severely thickened tri-leaflet aortic valve with moderate calcification and reduced excursion the planimeter valve area is 1.28 Sq cm consistent with moderate to severe aortic stenosis   LVOT calcification: None   Annular calcification: None   Aortic Valve Calcium Score: 776   Presence of  severe basal septal hypertrophy: No   Perimembranous septal diameter: 5.8 mm   Mitral Valve: Mild mitral annular calcification. 2D MVA 5.91 cm2. Mild thickening.   Aortic Annulus Measurements- 25%   Major annulus diameter: 26 mm   Minor annulus diameter: 23 mm   Annular perimeter: 76 mm   Annular area: 4.35 cm2   Aortic Root Measurements- 70% phase   Sinotubular Junction: 38 mm   Ascending Thoracic Aorta: 40 mm   Aortic Arch: 27 mm   Descending Thoracic Aorta: 29 mm   Aortic atherosclerosis.   Sinus of Valsalva Measurements:   Right coronary cusp width: 31 mm   Left coronary cusp width: 31 mm   Non coronary cusp width: 30 mm   Coronary Artery Height above Annulus:   Left main: 9 mm   Left SoV height: 19 mm   Right Coronary: 18 mm   Right SoV height: 21 mm   Optimum Fluoroscopic Angle for Delivery: LAO 5, CAU 7   Cusp overlay view angle: RAO 0, CAU 11   Valves for structural team consideration: Low end for a 26 mm Sapien Valve. Recommended over 29 mm Evolut Valve, though left system has been bypassed.   Non TAVR Valve Findings:   Coronary Arteries: Normal coronary origin. Study not completed with nitroglycerin. S/p CABG. Calcium score deferred. Vein graft markers noted.   Systemic veins:Normal IVC, SVC, and coronary sinus anatomy   Main Pulmonary artery: Severe dilation, 36 mm. This can be seen in pulmonary hypertension.   Pulmonic valve: Tri-leaflet valve.  Mild thickening.   Pulmonary veins: Normal variant pulmonary vein- right middle pulmonary vein.   Left atrial appendage: Patent   Interatrial septum: Grossly no communications.   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 No evidence of thrombus.   Left atrium: Grossly normal   Right ventricle: Normal size   Right atrium: Grossly normal   Pericardium: Rare calcification over the RV anterior pericardium.  Extra Cardiac Findings as per  separate reporting.   IMPRESSION: 1. Moderate to severe aortic stenosis. Findings pertinent to TAVR procedure are detailed above.   RECOMMENDATIONS:   The proposed cut-off value of 1,651 AU yielded a 93 % sensitivity and 75 % specificity in grading AS severity in patients with classical low-flow, low-gradient AS. Proposed different cut-off values to define severe AS for men and women as 2,065 AU and 1,274 AU, respectively. The joint European and American recommendations for the assessment of AS consider the aortic valve calcium score as a continuum - a very high calcium score suggests severe AS and a low calcium score suggests severe AS is unlikely.   Sunday Shams, et al. 2017 ESC/EACTS Guidelines for the management of valvular heart disease. Eur Heart J 5792113441   Coronary artery calcium (CAC) score is a strong predictor of incident coronary heart disease (CHD) and provides predictive information beyond traditional risk factors. CAC scoring is reasonable to use in the decision to withhold, postpone, or initiate statin therapy in intermediate-risk or selected borderline-risk asymptomatic adults (age 68-75 years and LDL-C >=70 to <190 mg/dL) who do not have diabetes or established atherosclerotic cardiovascular disease (ASCVD).* In intermediate-risk (10-year ASCVD risk >=7.5% to <20%) adults or selected borderline-risk (10-year ASCVD risk >=5% to <7.5%) adults in whom a CAC score is measured for the purpose of making a treatment decision the following recommendations have been made:   If CAC = 0, it is reasonable to withhold statin therapy and reassess in 5 to 10 years, as long as higher risk conditions are absent (diabetes mellitus, family history of premature CHD in first degree relatives (males <55 years; females <65 years), cigarette smoking, LDL >=190 mg/dL or other independent risk factors).   If CAC is 1 to 99, it is reasonable to initiate statin  therapy for patients >=40 years of age.   If CAC is >=100 or >=75th percentile, it is reasonable to initiate statin therapy at any age.   Cardiology referral should be considered for patients with CAC scores >=400 or >=75th percentile.   *2018 AHA/ACC/AACVPR/AAPA/ABC/ACPM/ADA/AGS/APhA/ASPC/NLA/PCNA Guideline on the Management of Blood Cholesterol: A Report of the American College of Cardiology/American Heart Association Task Force on Clinical Practice Guidelines. J Am Coll Cardiol. 2019;73(24):3168-3209.   Mahesh  Chandrasekhar   Electronically Signed: By: Riley Lam M.D. On: 04/01/2023 15:42       Narrative & Impression  CLINICAL DATA:  Preop evaluation for aortic valve replacement   EXAM: CT ANGIOGRAPHY CHEST, ABDOMEN AND PELVIS   TECHNIQUE: Non-contrast CT of the chest was initially obtained.   Multidetector CT imaging through the chest, abdomen and pelvis was performed using the standard protocol during bolus administration of intravenous contrast. Multiplanar reconstructed images and MIPs were obtained and reviewed to evaluate the vascular anatomy.   RADIATION DOSE REDUCTION: This exam was performed according to the departmental dose-optimization program which includes automated exposure control, adjustment of the mA and/or kV according to patient size and/or use of iterative reconstruction technique.   CONTRAST:  95mL OMNIPAQUE IOHEXOL 350 MG/ML SOLN   COMPARISON:  None Available.   FINDINGS: CTA CHEST FINDINGS   Cardiovascular: Normal heart size. 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 Thickening and calcifications of the aortic valve. No pericardial effusion. Normal caliber thoracic aorta with moderate atherosclerotic disease. Coronary artery calcifications status post CABG.   Mediastinum/Nodes: Esophagus and thyroid are unremarkable. Mildly enlarged subcarinal lymph node measuring 14 mm in short  axis,  likely reactive.   Lungs/Pleura: Central airways are patent. Scattered calcified pulmonary nodules. No consolidation, pleural effusion or pneumothorax.   Musculoskeletal: Prior median sternotomy. No chest wall abnormality. No acute or significant osseous findings.   CTA ABDOMEN AND PELVIS FINDINGS   Hepatobiliary: No focal liver abnormality is seen. No gallstones, gallbladder wall thickening, or biliary dilatation.   Pancreas: Unremarkable. No pancreatic ductal dilatation or surrounding inflammatory changes.   Spleen: Normal in size without focal abnormality.   Adrenals/Urinary Tract: Bilateral adrenal glands are unremarkable. No hydronephrosis or nephrolithiasis. Bladder is unremarkable.   Stomach/Bowel: Stomach is within normal limits. No evidence of bowel wall thickening, distention, or inflammatory changes. Calcified exophytic lesion of the descending colon measuring 2.2 x 1.8 cm on series 4, image 43   Vascular/lymphatic: Normal caliber abdominal aorta with moderate atherosclerotic disease. No enlarged lymph nodes seen in the or pelvis.   Reproductive: Prostate is unremarkable.   Other: Small fat containing right inguinal hernia. No abdominopelvic ascites.   Musculoskeletal: No acute or significant osseous findings.   VASCULAR MEASUREMENTS PERTINENT TO TAVR:   AORTA:   Minimal Aortic Diameter-16.0 mm   Severity of Aortic Calcification-moderate   RIGHT PELVIS:   Right Common Iliac Artery -   Minimal Diameter-6.1 mm   Tortuosity-mild   Calcification-moderate   Right External Iliac Artery -   Minimal Diameter-7.0 mm   Tortuosity-moderate   Calcification-moderate   Right Common Femoral Artery -   Minimal Diameter-6.1 mm   Tortuosity-none   Calcification-mild   LEFT PELVIS:   Left Common Iliac Artery -   Minimal Diameter-5.4 mm   Tortuosity-mild   Calcification-moderate   Left External Iliac Artery -   Minimal Diameter-5.0 mm    Tortuosity-moderate   Calcification-mild   Left Common Femoral Artery -   Minimal Diameter-5.6 mm   Tortuosity-none   Calcification-moderate   Review of the MIP images confirms the above findings.   IMPRESSION: 1. Vascular findings and measurements pertinent to potential TAVR procedure, as detailed above. 2. Thickening and calcification of the aortic valve, compatible with reported clinical history of aortic stenosis. 3. Moderate aortoiliac atherosclerosis. Coronary artery disease status post CABG. 4. 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 5. Calcified exophytic lesion of the descending colon measuring 2.2 cm, possibly sequela of prior fat infarction although neoplasm can not be excluded. Recommend comparison with more remote prior exams, if available, to ensure long-term stability. Alternatively, recommend follow-up CT of the abdomen and pelvis in 3 months to ensure stability.     Electronically Signed   By: Allegra Lai M.D.   On: 04/02/2023 12:39     Impression:  This 77 year old gentleman has stage D3 severe, low flow/low gradient aortic stenosis and moderate to severe aortic insufficiency with NYHA class II symptoms of exertional fatigue.  He has had a reduction in his ejection fraction over time to 30 to 35% with global hypokinesis and a dilated left ventricle.  I have personally reviewed his 2D echocardiogram, cardiac catheterization, and CTA studies.  His echo shows a trileaflet aortic valve with moderate calcification and thickening and restricted leaflet mobility.  The mean gradient was measured at 20 mmHg with a dimensionless index of 0.25 and aortic valve area by VTI of 0.96 cm.  Stroke-volume index is low at 30.  There is moderate to severe aortic insufficiency.  His cardiac catheterization shows occlusion of his native coronary arteries and 5 out of 5 patent bypass grafts.  I agree that aortic  valve replacement is indicated in this  patient for relief of his symptoms and to prevent progressive left ventricular deterioration.  Given his age and prior coronary bypass surgery I think that transcatheter aortic valve replacement would be the best option for treating him.  His gated cardiac CTA shows anatomy suitable for TAVR using a 26 mm SAPIEN 3 valve.  His abdominal and pelvic CTA shows adequate pelvic vascular anatomy to allow transfemoral insertion.  The patient and his wife were counseled at length regarding treatment alternatives for management of severe symptomatic aortic stenosis. The risks and benefits of surgical intervention has been discussed in detail. Long-term prognosis with medical therapy was discussed. Alternative approaches such as conventional surgical aortic valve replacement, transcatheter aortic valve replacement, and palliative medical therapy were compared and contrasted at length. This discussion was placed in the context of the patient's own specific clinical presentation and past medical history. All of their questions have been addressed.   Following the decision to proceed with transcatheter aortic valve replacement, a discussion was held regarding what types of management strategies would be attempted intraoperatively in the event of life-threatening complications, including whether or not the patient would be considered a candidate for the use of cardiopulmonary bypass and/or conversion to open sternotomy for attempted surgical intervention.  With prior coronary artery bypass graft surgery I do not think he is a candidate for emergent sternotomy to manage any intraoperative complications.  The patient is aware of the fact that transient use of cardiopulmonary bypass may be necessary. The patient has been advised of a variety of complications that might develop including but not limited to risks of death, stroke, paravalvular leak, aortic dissection or other major vascular complications, aortic annulus rupture,  device embolization, cardiac rupture or perforation, mitral regurgitation, acute myocardial infarction, arrhythmia, heart block or bradycardia requiring permanent pacemaker placement, congestive heart failure, respiratory failure, renal failure, pneumonia, infection, other late complications related to structural valve deterioration or migration, or other complications that might ultimately cause a temporary or permanent loss of functional independence or other long term morbidity. The patient provides full informed consent for the procedure as described and all questions were answered.      Plan:  He will be scheduled for transfemoral TAVR using a SAPIEN 3 valve on 04/14/2023.  I spent 60 minutes performing this consultation and > 50% of this time was spent face to face counseling and coordinating the care of this patient's severe symptomatic aortic stenosis and moderate to severe aortic insufficiency.   [name], MD 04/02/2023

## 2023-04-03 ENCOUNTER — Encounter: Payer: Self-pay | Admitting: Surgery

## 2023-04-03 ENCOUNTER — Other Ambulatory Visit: Payer: Self-pay

## 2023-04-03 DIAGNOSIS — I350 Nonrheumatic aortic (valve) stenosis: Secondary | ICD-10-CM

## 2023-04-03 NOTE — Progress Notes (Signed)
Procedure Type: Isolated AVR  PERIOPERATIVE OUTCOME ESTIMATE %  Operative Mortality 2.6%  Morbidity & Mortality 17.3%  Stroke 1.96%  Renal Failure 1.06%  Reoperation 5.58%  Prolonged Ventilation 11.1%  Deep Sternal Wound Infection 0.181%  Long Hospital Stay (>14 days) 5%  [facility] Stay (<6 days)* 38.6%

## 2023-04-10 ENCOUNTER — Ambulatory Visit (HOSPITAL_COMMUNITY)
Admission: RE | Admit: 2023-04-10 | Discharge: 2023-04-10 | Disposition: A | Discharge status: Home / Self Care | Payer: PPO | Source: Ambulatory Visit | Attending: Cardiovascular Disease | Admitting: Cardiovascular Disease

## 2023-04-10 ENCOUNTER — Encounter (HOSPITAL_COMMUNITY)
Admission: RE | Admit: 2023-04-10 | Discharge: 2023-04-10 | Disposition: A | Discharge status: Home / Self Care | Payer: PPO | Source: Ambulatory Visit | Attending: Cardiovascular Disease

## 2023-04-10 ENCOUNTER — Other Ambulatory Visit: Payer: Self-pay

## 2023-04-10 DIAGNOSIS — Z01818 Encounter for other preprocedural examination: Secondary | ICD-10-CM | POA: Diagnosis not present

## 2023-04-10 DIAGNOSIS — Z1152 Encounter for screening for COVID-19: Secondary | ICD-10-CM | POA: Insufficient documentation

## 2023-04-10 DIAGNOSIS — I350 Nonrheumatic aortic (valve) stenosis: Secondary | ICD-10-CM | POA: Insufficient documentation

## 2023-04-10 DIAGNOSIS — I517 Cardiomegaly: Secondary | ICD-10-CM | POA: Insufficient documentation

## 2023-04-10 LAB — URINALYSIS, ROUTINE W REFLEX MICROSCOPIC
Bacteria, UA: NONE SEEN
Bilirubin Urine: NEGATIVE
Glucose, UA: >=500 mg/dL — AB
Hgb urine dipstick: NEGATIVE
Ketones, ur: NEGATIVE mg/dL
Leukocytes,Ua: NEGATIVE
Nitrite: NEGATIVE
Protein, ur: NEGATIVE mg/dL
Specific Gravity, Urine: 1.008 (ref 1.005–1.030)
pH: 6 (ref 5.0–8.0)

## 2023-04-10 LAB — COMPREHENSIVE METABOLIC PANEL
ALT: 20 U/L (ref 0–44)
AST: 18 U/L (ref 15–41)
Albumin: 4.4 g/dL (ref 3.5–5.0)
Alkaline Phosphatase: 75 U/L (ref 38–126)
Anion gap: 10 (ref 5–15)
BUN: 22 mg/dL (ref 8–23)
CO2: 25 mmol/L (ref 22–32)
Calcium: 9.5 mg/dL (ref 8.9–10.3)
Chloride: 104 mmol/L (ref 98–111)
Creatinine, Ser: 1.24 mg/dL (ref 0.61–1.24)
GFR, Estimated: >60 mL/min (ref >60)
Glucose, Bld: 130 mg/dL — ABNORMAL HIGH (ref 70–99)
Potassium: 4.4 mmol/L (ref 3.5–5.1)
Sodium: 139 mmol/L (ref 135–145)
Total Bilirubin: 0.7 mg/dL (ref 0.3–1.2)
Total Protein: 8.2 g/dL — ABNORMAL HIGH (ref 6.5–8.1)

## 2023-04-10 LAB — TYPE AND SCREEN
ABO/RH(D): O POS
Antibody Screen: NEGATIVE

## 2023-04-10 LAB — CBC
HCT: 49.9 % (ref 39.0–52.0)
Hemoglobin: 15.7 g/dL (ref 13.0–17.0)
MCH: 29.1 pg (ref 26.0–34.0)
MCHC: 31.5 g/dL (ref 30.0–36.0)
MCV: 92.6 fL (ref 80.0–100.0)
Platelets: 276 10*3/uL (ref 150–400)
RBC: 5.39 MIL/uL (ref 4.22–5.81)
RDW: 13.3 % (ref 11.5–15.5)
WBC: 10.1 10*3/uL (ref 4.0–10.5)
nRBC: 0 % (ref 0.0–0.2)

## 2023-04-10 LAB — PROTIME-INR
INR: 1 (ref 0.8–1.2)
Prothrombin Time: 13.5 s (ref 11.4–15.2)

## 2023-04-10 LAB — SURGICAL PCR SCREEN
MRSA, PCR: NEGATIVE
Staphylococcus aureus: NEGATIVE

## 2023-04-10 NOTE — Progress Notes (Signed)
Patient signed all consents at PAT lab appointment. CHG soap and instructions were given to patient. CHG surgical prep reviewed with patient and all questions answered.  Patients chart send to anesthesia for review.  

## 2023-04-11 LAB — SARS CORONAVIRUS 2 (TAT 6-24 HRS): SARS Coronavirus 2: NEGATIVE

## 2023-04-13 MED ORDER — DEXMEDETOMIDINE HCL IN NACL 400 MCG/100ML IV SOLN
0.1000 ug/kg/h | INTRAVENOUS | Status: AC
  Administered 2023-04-14: 1 ug/kg/h via INTRAVENOUS
  Filled 2023-04-13: qty 100

## 2023-04-13 MED ORDER — NOREPINEPHRINE 4 MG/250ML-% IV SOLN
0.0000 ug/min | INTRAVENOUS | Status: AC
  Administered 2023-04-14: 2 ug/min via INTRAVENOUS
  Filled 2023-04-13: qty 250

## 2023-04-13 MED ORDER — MAGNESIUM SULFATE 50 % IJ SOLN
40.0000 meq | INTRAMUSCULAR | Status: DC
  Filled 2023-04-13 (×2): qty 9.85

## 2023-04-13 MED ORDER — POTASSIUM CHLORIDE 2 MEQ/ML IV SOLN
80.0000 meq | INTRAVENOUS | Status: DC
  Filled 2023-04-13 (×2): qty 40

## 2023-04-13 MED ORDER — CEFAZOLIN SODIUM-DEXTROSE 2-4 GM/100ML-% IV SOLN
2.0000 g | INTRAVENOUS | Status: AC
  Administered 2023-04-14: 2 g via INTRAVENOUS
  Filled 2023-04-13: qty 100

## 2023-04-13 MED ORDER — HEPARIN 30,000 UNITS/1000 ML (OHS) CELLSAVER SOLUTION
Status: DC
  Filled 2023-04-13 (×2): qty 1000

## 2023-04-13 NOTE — H&P (Signed)
[address]       [place] [address]             [phone]      Cardiothoracic Surgery Admission History and Physical   PCP is Selinda Flavin, MD Referring Provider is Verne Carrow, MD Primary Cardiologist is Dina Rich, MD   Reason for admission: Severe aortic stenosis and moderate to severe aortic insufficiency   HPI:   The patient is a 77 year old gentleman with a history of hypertension, hyperlipidemia, coronary artery disease status post CABG x 5 by me in 1999, chronic systolic HF/ICM with an EF of 20 to 25% by echo at [facility] in 12/2019, and aortic stenosis.  When he presented in 2021 with significant volume overload he was treated with diuresis and started on medical therapy including Entresto.  His ejection fraction increased to 40 to 45% but he developed hyperkalemia and the Entresto had to be stopped.  An echocardiogram in July 2023 showed an ejection fraction of 40 to 45% with apical akinesis.  2D echocardiogram in July 2024 showed a drop in his ejection fraction to 30 to 35% with grade 2 diastolic dysfunction.  The mean gradient across the calcified aortic valve was 20 mmHg with a valve area by VTI of 0.97 cm and a dimensionless index of 0.25.  There is moderate to severe aortic insufficiency.  This was felt to be consistent with moderate to severe low-flow/low gradient aortic stenosis.   He lives with his wife.  He is fairly sedentary and still trying to recover after a broken left femur last year.  He does mow his grass with a riding lawnmower and uses a weed eater.  He reports some exertional fatigue but denies any shortness of breath with exertion.  He denies any chest pain or pressure.  Has had no dizziness or syncope.  He denies orthopnea.  He has had lower extremity edema that is worse on the right than the left and improved with Lasix.       Past Medical History:  Diagnosis Date   CAD (coronary artery disease)     Cancer (HCC)     CHF  (congestive heart failure) (HCC)     Hypertension     Ischemic cardiomyopathy     PSVT (paroxysmal supraventricular tachycardia)                 Past Surgical History:  Procedure Laterality Date   RIGHT/LEFT HEART CATH AND CORONARY/GRAFT ANGIOGRAPHY N/A 02/08/2020    Procedure: RIGHT/LEFT HEART CATH AND CORONARY/GRAFT ANGIOGRAPHY;  Surgeon: Kathleene Hazel, MD;  Location: [facility];  Service: Cardiovascular;  Laterality: N/A;   RIGHT/LEFT HEART CATH AND CORONARY/GRAFT ANGIOGRAPHY N/A 03/30/2023    Procedure: RIGHT/LEFT HEART CATH AND CORONARY/GRAFT ANGIOGRAPHY;  Surgeon: Kathleene Hazel, MD;  Location: [facility] CV [facility];  Service: Cardiovascular;  Laterality: N/A;   SKIN CANCER EXCISION                   Family History  Problem Relation Age of Onset   Heart attack Mother 86   Rheum arthritis Mother     Diabetes Father     Heart disease Father          died after bypass surgery   Cancer Brother          jaw    Stroke Paternal Grandmother            Social History  Socioeconomic History   Marital status: Married      Spouse name: Not on file   Number of children: Not on file   Years of education: Not on file   Highest education level: Not on file  Occupational History   Not on file  Tobacco Use   Smoking status: Former      Current packs/day: 0.00      Types: Cigarettes      Quit date: 01/17/1990      Years since quitting: 33.2   Smokeless tobacco: Never  Vaping Use   Vaping status: Never Used  Substance and Sexual Activity   Alcohol use: Yes      Comment: rarely - beer   Drug use: Never   Sexual activity: Not on file  Other Topics Concern   Not on file  Social History Narrative   Not on file    Social Determinants of Health    Financial Resource Strain: Not on file  Food Insecurity: Not on file  Transportation Needs: Not on file  Physical Activity: Not on file  Stress: Not on file  Social Connections: Not on file   Intimate Partner Violence: Not on file             Prior to Admission medications   Medication Sig Start Date End Date Taking? Authorizing Provider  acetaminophen (TYLENOL) 650 MG CR tablet Take 1,300 mg by mouth every 8 (eight) hours as needed for pain.     Yes [provider]  aspirin EC 81 MG tablet Take 1 tablet (81 mg total) by mouth daily. Swallow whole. 04/02/20   Yes Netta Neat., NP  Coenzyme Q10 (COQ10) 100 MG CAPS Take 100 mg by mouth daily.     Yes [provider]  furosemide (LASIX) 20 MG tablet TAKE 1 TABLET (20 MG TOTAL) BY MOUTH DAILY. MAY TAKE ADDITIONAL 20 MG AS NEEDED FOR SWELLING 02/10/23   Yes Branch, Dorothe Pea, MD  glipiZIDE (GLUCOTROL XL) 5 MG 24 hr tablet Take 5 mg by mouth daily. 04/15/22   Yes [provider]  hydrALAZINE (APRESOLINE) 25 MG tablet TAKE 1 TABLET BY MOUTH TWICE A DAY 03/05/23   Yes Jonelle Sidle, MD  isosorbide mononitrate (IMDUR) 30 MG 24 hr tablet TAKE 1/2 OF A TABLET (15 MG TOTAL) BY MOUTH DAILY 05/13/22   Yes Branch, Dorothe Pea, MD  JARDIANCE 10 MG TABS tablet Take 10 mg by mouth daily. 04/13/21   Yes [provider]  levothyroxine (SYNTHROID) 50 MCG tablet Take 50 mcg by mouth daily. 03/24/22   Yes [provider]  metFORMIN (GLUCOPHAGE-XR) 500 MG 24 hr tablet Take 500 mg by mouth 2 (two) times daily.     Yes [provider]  metoprolol succinate (TOPROL-XL) 100 MG 24 hr tablet TAKE 1.5 TABLETS (150 MG TOTAL) BY MOUTH DAILY. TAKE WITH OR IMMEDIATELY FOLLOWING A MEAL. 08/08/22   Yes Branch, Dorothe Pea, MD  rosuvastatin (CRESTOR) 20 MG tablet Take 1 tablet (20 mg total) by mouth daily. 03/10/23   Yes BranchDorothe Pea, MD            Current Outpatient Medications  Medication Sig Dispense Refill   acetaminophen (TYLENOL) 650 MG CR tablet Take 1,300 mg by mouth every 8 (eight) hours as needed for pain.       aspirin EC 81 MG tablet Take 1 tablet (81 mg total) by mouth daily. Swallow whole.        Coenzyme  Q10 (COQ10) 100 MG CAPS Take 100 mg by mouth daily.       furosemide (LASIX) 20 MG tablet TAKE 1 TABLET (20 MG TOTAL) BY MOUTH DAILY. MAY TAKE ADDITIONAL 20 MG AS NEEDED FOR SWELLING 90 tablet 2   glipiZIDE (GLUCOTROL XL) 5 MG 24 hr tablet Take 5 mg by mouth daily.       hydrALAZINE (APRESOLINE) 25 MG tablet TAKE 1 TABLET BY MOUTH TWICE A DAY 180 tablet 1   isosorbide mononitrate (IMDUR) 30 MG 24 hr tablet TAKE 1/2 OF A TABLET (15 MG TOTAL) BY MOUTH DAILY 45 tablet 3   JARDIANCE 10 MG TABS tablet Take 10 mg by mouth daily.       levothyroxine (SYNTHROID) 50 MCG tablet Take 50 mcg by mouth daily.       metFORMIN (GLUCOPHAGE-XR) 500 MG 24 hr tablet Take 500 mg by mouth 2 (two) times daily.       metoprolol succinate (TOPROL-XL) 100 MG 24 hr tablet TAKE 1.5 TABLETS (150 MG TOTAL) BY MOUTH DAILY. TAKE WITH OR IMMEDIATELY FOLLOWING A MEAL. 135 tablet 3   rosuvastatin (CRESTOR) 20 MG tablet Take 1 tablet (20 mg total) by mouth daily. 90 tablet 1      No current facility-administered medications for this visit.        Allergies  No Known Allergies         Review of Systems:               General:                      normal appetite, + decreased energy, no weight gain, no weight loss, no fever             Cardiac:                       no chest pain with exertion, no chest pain at rest, no SOB with mild exertion, no resting SOB, no PND, no orthopnea, no palpitations, no arrhythmia, no atrial fibrillation, + LE edema, no dizzy spells, no syncope             Respiratory:                 no shortness of breath, no home oxygen, no productive cough, no dry cough, no bronchitis, no wheezing, no hemoptysis, no asthma, no pain with inspiration or cough, no sleep apnea, no CPAP at night             GI:                               no difficulty swallowing, no reflux, no frequent heartburn, no hiatal hernia, no abdominal pain, no constipation, no diarrhea, no hematochezia, no hematemesis, no  melena             GU:                              no dysuria,  no frequency, no urinary tract infection, no hematuria, no enlarged prostate, no kidney stones, no kidney disease             Vascular:                     no pain suggestive of claudication, no pain in feet,  no leg cramps, no varicose veins, no DVT, no non-healing foot ulcer             Neuro:                         no stroke, no TIA's, no seizures, no headaches, no temporary blindness one eye,  no slurred speech, no peripheral neuropathy, no chronic pain, no instability of gait, no memory/cognitive dysfunction             Musculoskeletal:         + arthritis, no joint swelling, no myalgias, no difficulty walking, + reduced mobility and uses a cane.             Skin:                            no rash, no itching, no skin infections, no pressure sores or ulcerations             Psych:                         no anxiety, no depression, no nervousness, no unusual recent stress             Eyes:                           no blurry vision, no floaters, no recent vision changes, no glasses or contacts             ENT:                            no hearing loss, no loose or painful teeth, no dentures, last saw dentist 04/2021             Hematologic:               no easy bruising, no abnormal bleeding, no clotting disorder, no frequent epistaxis             Endocrine:                   + diabetes, does check CBG's at home                            Physical Exam:               BP 124/69   Pulse 94   Resp 20   Ht 5\' 10"  (1.778 m)   Wt 220 lb (99.8 kg)   SpO2 96% Comment: RA  BMI 31.57 kg/m              General:                      Obese gentleman,  well-appearing             HEENT:                       Unremarkable, NCAT, PERLA, EOMI             Neck:                           no JVD, no bruits, no adenopathy or  thyromegaly             Chest:                          clear to auscultation, symmetrical breath sounds, no wheezes,  no rhonchi              CV:                              RRR, 3/6 systolic murmur RSB, no diastolic murmur             Abdomen:                    soft, non-tender, no masses              Extremities:                 warm, well-perfused, pedal pulses not palpable, mild bilateral lower extremity edema             Rectal/GU                   Deferred             Neuro:                         Grossly non-focal and symmetrical throughout             Skin:                            Clean and dry, no rashes, no breakdown   Diagnostic Tests:    ECHOCARDIOGRAM REPORT       Patient Name:   TINO VERHALEN [facility] Date of Exam: 02/13/2023 Medical Rec #:  [id number]      Height:       69.5 in Accession #:    [id number]     Weight:       225.2 lb Date of Birth:  01-Sep-1945     BSA:          2.184 m Patient Age:    76 years       BP:           130/70 mmHg Patient Gender: M              HR:           79 bpm. [facility]  Procedure: 2D Echo, Cardiac Doppler and Color Doppler  Indications:    Aortic stenosis I35.0   History:        Patient has prior history of Echocardiogram examinations, most                 recent 02/27/2022. Cardiomyopathy and CHF, Arrythmias:Atrial                 arrhythmia; Risk Factors:Former Smoker, Hypertension, Diabetes                 and Dyslipidemia.   Sonographer:    Celesta Gentile RCS Referring Phys: [id number] Dorothe Pea BRANCH  IMPRESSIONS    1. Left ventricular ejection fraction, by estimation, is 30 to 35%. The left ventricle has moderately decreased function. The left ventricle demonstrates global hypokinesis. The left ventricular internal cavity size was mildly dilated. Left ventricular diastolic parameters are consistent  with Grade II diastolic dysfunction (pseudonormalization). Elevated left atrial pressure.  2. Right ventricular systolic function is normal. The right ventricular size is normal. Tricuspid regurgitation signal is inadequate for  assessing PA pressure.  3. Left atrial size was severely dilated.  4. The mitral valve is normal in structure. Trivial mitral valve regurgitation. No evidence of mitral stenosis.  5. Moderate to severe low flow low gradient aortic stenosis. AVA VTI 0.96, mean gradient 20, DI 0.25. The aortic valve has an indeterminant number of cusps. There is moderate calcification of the aortic valve. There is moderate thickening of the aortic valve. Aortic valve regurgitation is moderate to severe.  6. The inferior vena cava is normal in size with greater than 50% respiratory variability, suggesting right atrial pressure of 3 mmHg.  FINDINGS  Left Ventricle: Global hypokinesis, the apex is akinetic. Left ventricular ejection fraction, by estimation, is 30 to 35%. The left ventricle has moderately decreased function. The left ventricle demonstrates global hypokinesis. Definity contrast agent was given IV to delineate the left ventricular endocardial borders. The left ventricular internal cavity size was mildly dilated. There is no left ventricular hypertrophy. Left ventricular diastolic parameters are consistent with Grade II diastolic dysfunction (pseudonormalization). Elevated left atrial pressure.  Right Ventricle: The right ventricular size is normal. Right vetricular wall thickness was not well visualized. Right ventricular systolic function is normal. Tricuspid regurgitation signal is inadequate for assessing PA pressure.  Left Atrium: Left atrial size was severely dilated.  Right Atrium: Right atrial size was normal in size.  Pericardium: There is no evidence of pericardial effusion.  Mitral Valve: The mitral valve is normal in structure. Trivial mitral valve regurgitation. No evidence of mitral valve stenosis.  Tricuspid Valve: The tricuspid valve is normal in structure. Tricuspid valve regurgitation is not demonstrated. No evidence of tricuspid stenosis.  Aortic Valve: Moderate to  severe low flow low gradient aortic stenosis. AVA VTI 0.96, mean gradient 20, DI 0.25. The aortic valve has an indeterminant number of cusps. There is moderate calcification of the aortic valve. There is moderate thickening of the aortic valve. There is moderate aortic valve annular calcification. Aortic valve regurgitation is moderate to severe. Aortic valve mean gradient measures 20.0 mmHg. Aortic valve peak gradient measures 29.8 mmHg. Aortic valve area, by VTI measures 0.96 cm.  Pulmonic Valve: The pulmonic valve was not well visualized. Pulmonic valve regurgitation is not visualized. No evidence of pulmonic stenosis.  Aorta: The aortic root is normal in size and structure.  Venous: The inferior vena cava is normal in size with greater than 50% respiratory variability, suggesting right atrial pressure of 3 mmHg.  IAS/Shunts: No atrial level shunt detected by color flow Doppler.    LEFT VENTRICLE PLAX 2D LVIDd:         6.40 cm   Diastology LVIDs:         4.90 cm   LV e' medial:    4.13 cm/s LV PW:         0.90 cm   LV E/e' medial:  26.2 LV IVS:        0.80 cm   LV e' lateral:   6.31 cm/s LVOT diam:     2.20 cm   LV E/e' lateral: 17.1 LV SV:         66 LV SV Index:   30 LVOT Area:     3.80 cm    RIGHT VENTRICLE RV S prime:     9.68 cm/s TAPSE (M-mode): 2.0 cm  LEFT ATRIUM              Index        RIGHT ATRIUM           Index LA diam:        4.50 cm  2.06 cm/m   RA Area:     20.00 cm LA Vol (A2C):   105.0 ml 48.08 ml/m  RA Volume:   54.00 ml  24.73 ml/m LA Vol (A4C):   99.9 ml  45.75 ml/m LA Biplane Vol: 107.0 ml 49.00 ml/m  AORTIC VALVE AV Area (Vmax):    1.05 cm AV Area (Vmean):   0.93 cm AV Area (VTI):     0.96 cm AV Vmax:           273.00 cm/s AV Vmean:          211.000 cm/s AV VTI:            0.687 m AV Peak Grad:      29.8 mmHg AV Mean Grad:      20.0 mmHg LVOT Vmax:         75.70 cm/s LVOT Vmean:        51.400 cm/s LVOT VTI:          0.173  m LVOT/AV VTI ratio: 0.25   AORTA Ao Root diam: 3.50 cm  MITRAL VALVE MV Area (PHT): 4.31 cm     SHUNTS MV Decel Time: 176 msec     Systemic VTI:  0.17 m MV E velocity: 108.00 cm/s  Systemic Diam: 2.20 cm MV A velocity: 88.70 cm/s MV E/A ratio:  1.22  Dina Rich MD Electronically signed by Dina Rich MD Signature Date/Time: 02/13/2023/12:08:28 PM       Final        Physicians   Panel Physicians Referring Physician Case Authorizing Physician  Kathleene Hazel, MD (Primary)        Procedures   RIGHT/LEFT HEART CATH AND CORONARY/GRAFT ANGIOGRAPHY    Conclusion       Ost LAD to Prox LAD lesion is 100% stenosed.   Mid LAD to Dist LAD lesion is 50% stenosed.   Prox Cx lesion is 100% stenosed.   Prox RCA to Mid RCA lesion is 50% stenosed.   Mid RCA lesion is 100% stenosed.   SVG and is normal in caliber.   SVG and is normal in caliber.    1. Severe triple vessel CAD s/p 5V CABG with 5/5 patent bypass grafts.  2. The LAD is known to be occluded proximally (not engaged today). The LAD and diagonal fill from the patent vein graft. The LIMA was not used for bypass.  3. The Circumflex is occluded in the proximal to mid vessel, known from prior cath and not engaged today. The patent sequential vein graft fills the proximal and distal OM branches 4. The RCA is occluded in the mid to distal vessel. The distal RCA/PDA and PLA fills from left to right collaterals and from the patent vein graft 5. Elevated right heart pressures   Recommendations: Medical management of CAD. Will continue workup for TAVR. We will arrange CT scans next and then get him in to see Dr. [name]. He will increase his Lasix to 40 mg daily.    Indications   Congestive dilated cardiomyopathy (HCC) [I42.0 (ICD-10-CM)]  Severe aortic stenosis [I35.0 (ICD-10-CM)]    Procedural Details   Technical Details Indication: Cardiomyopathy, severe low flow/low gradient aortic stenosis, CAD s/p 5V  CABG  Procedure: The risks, benefits, complications, treatment options, and expected outcomes were discussed with the patient. The patient and/or family concurred with the proposed plan, giving informed consent. The patient was sedated with Versed and Fentanyl. The IV catheter in the right antecubital vein was changed for a 5 [place] sheath. Right heart catheterization performed with a balloon tipped catheter. The left wrist was prepped and draped in a sterile fashion. 1% lidocaine was used for local anesthesia. Using the modified Seldinger access technique, a 5 French sheath was placed in the left radial artery. 3 mg Verapamil was given through the sheath. Weight based IV heparin was given. Standard diagnostic catheters were used to perform selective coronary angiography. I did not engage the left main as his LAD and circumflex are known to be occluded proximally from cath 3 years ago. I engaged the RCA and all vein grafts with the JR4 catheter. As noted in prior cath, the LIMA was not used as a graft. I did not cross the aortic valve. All catheter exchanges were performed over an exchange length guidewire.   The sheath was removed from the left radial artery and a hemostasis band was applied at the arteriotomy site on the left wrist.      Estimated blood loss <50 mL.   During this procedure medications were administered to achieve and maintain moderate conscious sedation while the patient's heart rate, blood pressure, and oxygen saturation were continuously monitored and I was present face-to-face 100% of this time.    Medications (Filter: Administrations occurring from 0739 to 0835 on 03/30/23)  important  Continuous medications are totaled by the amount administered until 03/30/23 0835.    fentaNYL (SUBLIMAZE) injection (mcg)  Total dose: 25 mcg Date/Time Rate/Dose/Volume Action    03/30/23 0742 25 mcg Given    midazolam (VERSED) injection (mg)  Total dose: 1 mg Date/Time Rate/Dose/Volume  Action    03/30/23 0743 0.5 mg Given    0747 0.5 mg Given    Heparin (Porcine) in NaCl 1000-0.9 UT/500ML-% SOLN (mL)  Total volume: 1,000 mL Date/Time Rate/Dose/Volume Action    03/30/23 0745 500 mL Given    0745 500 mL Given    lidocaine (PF) (XYLOCAINE) 1 % injection (mL)  Total volume: 4 mL Date/Time Rate/Dose/Volume Action    03/30/23 0752 2 mL Given    0757 2 mL Given    Radial Cocktail/Verapamil only (mL)  Total volume: 10 mL Date/Time Rate/Dose/Volume Action    03/30/23 0804 10 mL Given    heparin sodium (porcine) injection (Units)  Total dose: 5,000 Units Date/Time Rate/Dose/Volume Action    03/30/23 0806 5,000 Units Given    iohexol (OMNIPAQUE) 350 MG/ML injection (mL)  Total volume: 30 mL Date/Time Rate/Dose/Volume Action    03/30/23 0824 30 mL Given      Sedation Time   Sedation Time Physician-1: 34 minutes 3 seconds Contrast        Administrations occurring from 0739 to 0835 on 03/30/23:  Medication Name Total Dose  iohexol (OMNIPAQUE) 350 MG/ML injection 30 mL    Radiation/Fluoro   Fluoro time: 7.5 (min) DAP: 15872 (mGycm2) Cumulative Air Kerma: 257 (mGy) Coronary Findings   Diagnostic Dominance: Right Left Anterior Descending  Vessel is large.  Ost LAD to Prox LAD lesion is 100% stenosed. The lesion is chronically occluded.  Mid LAD to Dist LAD lesion is 50% stenosed.    Left Circumflex  Vessel is large.  Prox Cx lesion is 100% stenosed. The lesion is chronically occluded.  Right Coronary Artery  Vessel is large.  Prox RCA to Mid RCA lesion is 50% stenosed.  Mid RCA lesion is 100% stenosed. The lesion is chronically occluded.    Third Right Posterolateral Branch  Collaterals  3rd RPL filled by collaterals from 2nd Sept.       Saphenous Graft To RPDA  SVG and is normal in caliber.    Sequential Saphenous Graft To 2nd Diag, Dist LAD  SVG and is normal in caliber.    Sequential Graft To 1st Mrg, 3rd Mrg    Intervention    No  interventions have been documented.    Coronary Diagrams   Diagnostic Dominance: Right  Intervention    Implants    No implant documentation for this case.    Syngo Images    Show images for CARDIAC CATHETERIZATION Images on Long Term Storage    Show images for Salim, Alonge "Rosanne Ashing" Link to Procedure Log   Procedure Log    Link to Procedure Log   Procedure Log    Hemo Data   Flowsheet Row Most Recent Value  Fick Cardiac Output 4.46 L/min  Fick Cardiac Output Index 2.05 (L/min)/BSA  RA A Wave 11 mmHg  RA V Wave 10 mmHg  RA Mean 8 mmHg  RV Systolic Pressure 72 mmHg  RV Diastolic Pressure 3 mmHg  RV EDP 10 mmHg  PA Systolic Pressure 67 mmHg  PA Diastolic Pressure 28 mmHg  PA Mean 43 mmHg  PW A Wave 28 mmHg  PW V Wave 45 mmHg  PW Mean 30 mmHg  AO Systolic Pressure 144 mmHg  AO Diastolic Pressure 76 mmHg  AO Mean 108 mmHg  QP/QS 1  TPVR Index 20.99 HRUI  TSVR Index 52.71 HRUI  PVR SVR Ratio 0.13  TPVR/TSVR Ratio 0.4      ADDENDUM REPORT: 04/02/2023 11:35   EXAM: OVER-READ INTERPRETATION  CT CHEST   The following report is an over-read performed by radiologist Dr. Jacob Moores [facility] on 04/02/2023. This over-read does not include interpretation of cardiac or coronary anatomy or pathology. The cardiac TAVR interpretation by the cardiologist is attached.   COMPARISON:  None.   FINDINGS: Extracardiac findings will be described separately under dictation for contemporaneously obtained CTA chest, abdomen and pelvis.   IMPRESSION: Please see separate dictation for contemporaneously obtained CTA chest, abdomen and pelvis dated March 12, 2023 for full description of relevant extracardiac findings.     Electronically Signed   By: Allegra Lai M.D.   On: 04/02/2023 11:35    Addended by Renford Dills, MD on 04/02/2023 11:38 AM    Study Result   Narrative & Impression  CLINICAL DATA:  Aortic Valve pathology with  assessment for TAVR   EXAM: Cardiac TAVR CT   TECHNIQUE: The patient was scanned on a Siemens Force 192 slice scanner. A 120 kV retrospective scan was triggered in the descending thoracic aorta at 111 HU's. Gantry rotation speed was 270 msecs and collimation was .9 mm. No beta blockade or nitro were given. The 3D data set was reconstructed in 5% intervals of the R-R cycle. Systolic and diastolic phases were analyzed on a dedicated work station using MPR, MIP and VRT modes. The patient received 95 cc of contrast.   FINDINGS: Aortic Valve: Severely thickened tri-leaflet aortic valve with moderate calcification and reduced excursion the planimeter valve area is 1.28 Sq cm consistent with moderate to severe aortic stenosis   LVOT calcification: None   Annular calcification:  None   Aortic Valve Calcium Score: 776   Presence of severe basal septal hypertrophy: No   Perimembranous septal diameter: 5.8 mm   Mitral Valve: Mild mitral annular calcification. 2D MVA 5.91 cm2. Mild thickening.   Aortic Annulus Measurements- 25%   Major annulus diameter: 26 mm   Minor annulus diameter: 23 mm   Annular perimeter: 76 mm   Annular area: 4.35 cm2   Aortic Root Measurements- 70% phase   Sinotubular Junction: 38 mm   Ascending Thoracic Aorta: 40 mm   Aortic Arch: 27 mm   Descending Thoracic Aorta: 29 mm   Aortic atherosclerosis.   Sinus of Valsalva Measurements:   Right coronary cusp width: 31 mm   Left coronary cusp width: 31 mm   Non coronary cusp width: 30 mm   Coronary Artery Height above Annulus:   Left main: 9 mm   Left SoV height: 19 mm   Right Coronary: 18 mm   Right SoV height: 21 mm   Optimum Fluoroscopic Angle for Delivery: LAO 5, CAU 7   Cusp overlay view angle: RAO 0, CAU 11   Valves for structural team consideration: Low end for a 26 mm Sapien Valve. Recommended over 29 mm Evolut Valve, though left system has been bypassed.   Non TAVR Valve  Findings:   Coronary Arteries: Normal coronary origin. Study not completed with nitroglycerin. S/p CABG. Calcium score deferred. Vein graft markers noted.   Systemic veins:Normal IVC, SVC, and coronary sinus anatomy   Main Pulmonary artery: Severe dilation, 36 mm. This can be seen in pulmonary hypertension.   Pulmonic valve: Tri-leaflet valve.  Mild thickening.   Pulmonary veins: Normal variant pulmonary vein- right middle pulmonary vein.   Left atrial appendage: Patent   Interatrial septum: Grossly no communications.   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 No evidence of thrombus.   Left atrium: Grossly normal   Right ventricle: Normal size   Right atrium: Grossly normal   Pericardium: Rare calcification over the RV anterior pericardium.   Extra Cardiac Findings as per separate reporting.   IMPRESSION: 1. Moderate to severe aortic stenosis. Findings pertinent to TAVR procedure are detailed above.   RECOMMENDATIONS:   The proposed cut-off value of 1,651 AU yielded a 93 % sensitivity and 75 % specificity in grading AS severity in patients with classical low-flow, low-gradient AS. Proposed different cut-off values to define severe AS for men and women as 2,065 AU and 1,274 AU, respectively. The joint European and American recommendations for the assessment of AS consider the aortic valve calcium score as a continuum - a very high calcium score suggests severe AS and a low calcium score suggests severe AS is unlikely.   Sunday Shams, et al. 2017 ESC/EACTS Guidelines for the management of valvular heart disease. Eur Heart J [phone]   Coronary artery calcium (CAC) score is a strong predictor of incident coronary heart disease (CHD) and provides predictive information beyond traditional risk factors. CAC scoring is reasonable to use in the decision to withhold, postpone, or initiate statin therapy  in intermediate-risk or selected borderline-risk asymptomatic adults (age 106-75 years and LDL-C >=70 to <190 mg/dL) who do not have diabetes or established atherosclerotic cardiovascular disease (ASCVD).* In intermediate-risk (10-year ASCVD risk >=7.5% to <20%) adults or selected borderline-risk (10-year ASCVD risk >=5% to <7.5%) adults in whom a CAC score is measured for the purpose of making a treatment decision the following recommendations have been made:  If CAC = 0, it is reasonable to withhold statin therapy and reassess in 5 to 10 years, as long as higher risk conditions are absent (diabetes mellitus, family history of premature CHD in first degree relatives (males <55 years; females <65 years), cigarette smoking, LDL >=190 mg/dL or other independent risk factors).   If CAC is 1 to 99, it is reasonable to initiate statin therapy for patients >=23 years of age.   If CAC is >=100 or >=75th percentile, it is reasonable to initiate statin therapy at any age.   Cardiology referral should be considered for patients with CAC scores >=400 or >=75th percentile.   *2018 AHA/ACC/AACVPR/AAPA/ABC/ACPM/ADA/AGS/APhA/ASPC/NLA/PCNA Guideline on the Management of Blood Cholesterol: A Report of the American College of Cardiology/American Heart Association Task Force on Clinical Practice Guidelines. J Am Coll Cardiol. 2019;73(24):3168-3209.   Mahesh  Chandrasekhar   Electronically Signed: By: Riley Lam M.D. On: 04/01/2023 15:42          Narrative & Impression  CLINICAL DATA:  Preop evaluation for aortic valve replacement   EXAM: CT ANGIOGRAPHY CHEST, ABDOMEN AND PELVIS   TECHNIQUE: Non-contrast CT of the chest was initially obtained.   Multidetector CT imaging through the chest, abdomen and pelvis was performed using the standard protocol during bolus administration of intravenous contrast. Multiplanar reconstructed images and MIPs were obtained and reviewed to  evaluate the vascular anatomy.   RADIATION DOSE REDUCTION: This exam was performed according to the departmental dose-optimization program which includes automated exposure control, adjustment of the mA and/or kV according to patient size and/or use of iterative reconstruction technique.   CONTRAST:  95mL OMNIPAQUE IOHEXOL 350 MG/ML SOLN   COMPARISON:  None Available.   FINDINGS: CTA CHEST FINDINGS   Cardiovascular: Normal heart size. 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 Thickening and calcifications of the aortic valve. No pericardial effusion. Normal caliber thoracic aorta with moderate atherosclerotic disease. Coronary artery calcifications status post CABG.   Mediastinum/Nodes: Esophagus and thyroid are unremarkable. Mildly enlarged subcarinal lymph node measuring 14 mm in short axis, likely reactive.   Lungs/Pleura: Central airways are patent. Scattered calcified pulmonary nodules. No consolidation, pleural effusion or pneumothorax.   Musculoskeletal: Prior median sternotomy. No chest wall abnormality. No acute or significant osseous findings.   CTA ABDOMEN AND PELVIS FINDINGS   Hepatobiliary: No focal liver abnormality is seen. No gallstones, gallbladder wall thickening, or biliary dilatation.   Pancreas: Unremarkable. No pancreatic ductal dilatation or surrounding inflammatory changes.   Spleen: Normal in size without focal abnormality.   Adrenals/Urinary Tract: Bilateral adrenal glands are unremarkable. No hydronephrosis or nephrolithiasis. Bladder is unremarkable.   Stomach/Bowel: Stomach is within normal limits. No evidence of bowel wall thickening, distention, or inflammatory changes. Calcified exophytic lesion of the descending colon measuring 2.2 x 1.8 cm on series 4, image 43   Vascular/lymphatic: Normal caliber abdominal aorta with moderate atherosclerotic disease. No enlarged lymph nodes seen in the or pelvis.    Reproductive: Prostate is unremarkable.   Other: Small fat containing right inguinal hernia. No abdominopelvic ascites.   Musculoskeletal: No acute or significant osseous findings.   VASCULAR MEASUREMENTS PERTINENT TO TAVR:   AORTA:   Minimal Aortic Diameter-16.0 mm   Severity of Aortic Calcification-moderate   RIGHT PELVIS:   Right Common Iliac Artery -   Minimal Diameter-6.1 mm   Tortuosity-mild   Calcification-moderate   Right External Iliac Artery -   Minimal Diameter-7.0 mm   Tortuosity-moderate   Calcification-moderate   Right Common  Femoral Artery -   Minimal Diameter-6.1 mm   Tortuosity-none   Calcification-mild   LEFT PELVIS:   Left Common Iliac Artery -   Minimal Diameter-5.4 mm   Tortuosity-mild   Calcification-moderate   Left External Iliac Artery -   Minimal Diameter-5.0 mm   Tortuosity-moderate   Calcification-mild   Left Common Femoral Artery -   Minimal Diameter-5.6 mm   Tortuosity-none   Calcification-moderate   Review of the MIP images confirms the above findings.   IMPRESSION: 1. Vascular findings and measurements pertinent to potential TAVR procedure, as detailed above. 2. Thickening and calcification of the aortic valve, compatible with reported clinical history of aortic stenosis. 3. Moderate aortoiliac atherosclerosis. Coronary artery disease status post CABG. 4. 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 5. Calcified exophytic lesion of the descending colon measuring 2.2 cm, possibly sequela of prior fat infarction although neoplasm can not be excluded. Recommend comparison with more remote prior exams, if available, to ensure long-term stability. Alternatively, recommend follow-up CT of the abdomen and pelvis in 3 months to ensure stability.     Electronically Signed   By: Allegra Lai M.D.   On: 04/02/2023 12:39      Impression:   This 77 year old gentleman has stage D3  severe, low flow/low gradient aortic stenosis and moderate to severe aortic insufficiency with NYHA class II symptoms of exertional fatigue.  He has had a reduction in his ejection fraction over time to 30 to 35% with global hypokinesis and a dilated left ventricle.  I have personally reviewed his 2D echocardiogram, cardiac catheterization, and CTA studies.  His echo shows a trileaflet aortic valve with moderate calcification and thickening and restricted leaflet mobility.  The mean gradient was measured at 20 mmHg with a dimensionless index of 0.25 and aortic valve area by VTI of 0.96 cm.  Stroke-volume index is low at 30.  There is moderate to severe aortic insufficiency.  His cardiac catheterization shows occlusion of his native coronary arteries and 5 out of 5 patent bypass grafts.  I agree that aortic valve replacement is indicated in this patient for relief of his symptoms and to prevent progressive left ventricular deterioration.  Given his age and prior coronary bypass surgery I think that transcatheter aortic valve replacement would be the best option for treating him.  His gated cardiac CTA shows anatomy suitable for TAVR using a 26 mm SAPIEN 3 valve.  His abdominal and pelvic CTA shows adequate pelvic vascular anatomy to allow transfemoral insertion.   The patient and his wife were counseled at length regarding treatment alternatives for management of severe symptomatic aortic stenosis. The risks and benefits of surgical intervention has been discussed in detail. Long-term prognosis with medical therapy was discussed. Alternative approaches such as conventional surgical aortic valve replacement, transcatheter aortic valve replacement, and palliative medical therapy were compared and contrasted at length. This discussion was placed in the context of the patient's own specific clinical presentation and past medical history. All of their questions have been addressed.    Following the decision to  proceed with transcatheter aortic valve replacement, a discussion was held regarding what types of management strategies would be attempted intraoperatively in the event of life-threatening complications, including whether or not the patient would be considered a candidate for the use of cardiopulmonary bypass and/or conversion to open sternotomy for attempted surgical intervention.  With prior coronary artery bypass graft surgery I do not think he is a candidate for emergent sternotomy to manage  any intraoperative complications.  The patient has been advised of a variety of complications that might develop including but not limited to risks of death, stroke, paravalvular leak, aortic dissection or other major vascular complications, aortic annulus rupture, device embolization, cardiac rupture or perforation, mitral regurgitation, acute myocardial infarction, arrhythmia, heart block or bradycardia requiring permanent pacemaker placement, congestive heart failure, respiratory failure, renal failure, pneumonia, infection, other late complications related to structural valve deterioration or migration, or other complications that might ultimately cause a temporary or permanent loss of functional independence or other long term morbidity. The patient provides full informed consent for the procedure as described and all questions were answered.       Plan:   Transfemoral TAVR using a SAPIEN 3 valve.       [name], MD

## 2023-04-13 NOTE — Anesthesia Preprocedure Evaluation (Signed)
Anesthesia Evaluation  Patient identified by MRN, date of birth, ID band Patient awake    Reviewed: Allergy & Precautions, NPO status , Patient's Chart, lab work & pertinent test results  Airway Mallampati: III  TM Distance: >3 FB Neck ROM: Limited    Dental  (+) Dental Advisory Given, Teeth Intact   Pulmonary former smoker   Pulmonary exam normal breath sounds clear to auscultation       Cardiovascular hypertension, Pt. on home beta blockers and Pt. on medications + CAD and +CHF   Rhythm:Regular Rate:Normal + Systolic murmurs Echo 02/2023  1. Left ventricular ejection fraction, by estimation, is 30 to 35%. The left ventricle has moderately decreased function. The left ventricle demonstrates global hypokinesis. The left ventricular internal cavity size was mildly dilated. Left ventricular diastolic parameters are consistent with Grade II diastolic dysfunction (pseudonormalization). Elevated left atrial pressure.  2. Right ventricular systolic function is normal. The right ventricular size is normal. Tricuspid regurgitation signal is inadequate for assessing PA pressure.  3. Left atrial size was severely dilated.  4. The mitral valve is normal in structure. Trivial mitral valve regurgitation. No evidence of mitral stenosis.  5. Moderate to severe low flow low gradient aortic stenosis. AVA VTI 0.96, mean gradient 20, DI 0.25. The aortic valve has an indeterminant number of cusps. There is moderate calcification of the aortic valve. There is moderate thickening of the aortic valve. Aortic valve regurgitation is moderate to severe.  6. The inferior vena cava is normal in size with greater than 50%respiratory variability, suggesting right atrial pressure of 3 mmHg.      Neuro/Psych negative neurological ROS     GI/Hepatic negative GI ROS, Neg liver ROS,,,  Endo/Other  diabetes    Renal/GU negative Renal ROS      Musculoskeletal  (+) Arthritis ,    Abdominal  (+) + obese  Peds  Hematology negative hematology ROS (+)   Anesthesia Other Findings   Reproductive/Obstetrics                             Anesthesia Physical Anesthesia Plan  ASA: 4  Anesthesia Plan: MAC   Post-op Pain Management: Minimal or no pain anticipated   Induction:   PONV Risk Score and Plan: 1 and Ondansetron, Propofol infusion, TIVA and Treatment may vary due to age or medical condition  Airway Management Planned: Natural Airway  Additional Equipment: Arterial line  Intra-op Plan:   Post-operative Plan:   Informed Consent: I have reviewed the patients History and Physical, chart, labs and discussed the procedure including the risks, benefits and alternatives for the proposed anesthesia with the patient or authorized representative who has indicated his/her understanding and acceptance.     Dental advisory given  Plan Discussed with: CRNA  Anesthesia Plan Comments:        Anesthesia Quick Evaluation

## 2023-04-14 ENCOUNTER — Inpatient Hospital Stay (HOSPITAL_COMMUNITY): Payer: PPO | Admitting: Physician Assistant

## 2023-04-14 ENCOUNTER — Encounter (HOSPITAL_COMMUNITY): Payer: Self-pay | Admitting: Cardiovascular Disease

## 2023-04-14 ENCOUNTER — Inpatient Hospital Stay (HOSPITAL_COMMUNITY): Payer: PPO

## 2023-04-14 ENCOUNTER — Encounter (HOSPITAL_COMMUNITY)
Admission: RE | Disposition: A | Discharge status: Home / Self Care | Payer: Self-pay | Source: Home / Self Care | Attending: Surgery

## 2023-04-14 ENCOUNTER — Other Ambulatory Visit: Payer: Self-pay | Admitting: Physician Assistant

## 2023-04-14 ENCOUNTER — Inpatient Hospital Stay (HOSPITAL_COMMUNITY)
Admission: RE | Admit: 2023-04-14 | Discharge: 2023-04-15 | DRG: 266 | Disposition: A | Discharge status: Home / Self Care | Payer: PPO | Attending: Surgery | Admitting: Surgery

## 2023-04-14 ENCOUNTER — Other Ambulatory Visit: Payer: Self-pay

## 2023-04-14 DIAGNOSIS — I2510 Atherosclerotic heart disease of native coronary artery without angina pectoris: Secondary | ICD-10-CM | POA: Diagnosis not present

## 2023-04-14 DIAGNOSIS — Z952 Presence of prosthetic heart valve: Principal | ICD-10-CM

## 2023-04-14 DIAGNOSIS — I2582 Chronic total occlusion of coronary artery: Secondary | ICD-10-CM | POA: Diagnosis present

## 2023-04-14 DIAGNOSIS — Z823 Family history of stroke: Secondary | ICD-10-CM

## 2023-04-14 DIAGNOSIS — Z79899 Other long term (current) drug therapy: Secondary | ICD-10-CM

## 2023-04-14 DIAGNOSIS — I255 Ischemic cardiomyopathy: Secondary | ICD-10-CM | POA: Diagnosis not present

## 2023-04-14 DIAGNOSIS — Z87891 Personal history of nicotine dependence: Secondary | ICD-10-CM | POA: Diagnosis not present

## 2023-04-14 DIAGNOSIS — I5022 Chronic systolic (congestive) heart failure: Secondary | ICD-10-CM | POA: Diagnosis not present

## 2023-04-14 DIAGNOSIS — Z8249 Family history of ischemic heart disease and other diseases of the circulatory system: Secondary | ICD-10-CM

## 2023-04-14 DIAGNOSIS — Z8261 Family history of arthritis: Secondary | ICD-10-CM

## 2023-04-14 DIAGNOSIS — E875 Hyperkalemia: Secondary | ICD-10-CM | POA: Diagnosis present

## 2023-04-14 DIAGNOSIS — Z7984 Long term (current) use of oral hypoglycemic drugs: Secondary | ICD-10-CM

## 2023-04-14 DIAGNOSIS — I350 Nonrheumatic aortic (valve) stenosis: Secondary | ICD-10-CM

## 2023-04-14 DIAGNOSIS — I110 Hypertensive heart disease with heart failure: Secondary | ICD-10-CM | POA: Diagnosis present

## 2023-04-14 DIAGNOSIS — I10 Essential (primary) hypertension: Secondary | ICD-10-CM | POA: Diagnosis present

## 2023-04-14 DIAGNOSIS — E785 Hyperlipidemia, unspecified: Secondary | ICD-10-CM | POA: Diagnosis present

## 2023-04-14 DIAGNOSIS — I5023 Acute on chronic systolic (congestive) heart failure: Secondary | ICD-10-CM | POA: Diagnosis present

## 2023-04-14 DIAGNOSIS — I352 Nonrheumatic aortic (valve) stenosis with insufficiency: Secondary | ICD-10-CM | POA: Diagnosis not present

## 2023-04-14 DIAGNOSIS — L4050 Arthropathic psoriasis, unspecified: Secondary | ICD-10-CM | POA: Diagnosis not present

## 2023-04-14 DIAGNOSIS — I358 Other nonrheumatic aortic valve disorders: Secondary | ICD-10-CM | POA: Diagnosis not present

## 2023-04-14 DIAGNOSIS — I4719 Other supraventricular tachycardia: Secondary | ICD-10-CM | POA: Diagnosis not present

## 2023-04-14 DIAGNOSIS — Z006 Encounter for examination for normal comparison and control in clinical research program: Secondary | ICD-10-CM

## 2023-04-14 DIAGNOSIS — Z6830 Body mass index (BMI) 30.0-30.9, adult: Secondary | ICD-10-CM

## 2023-04-14 DIAGNOSIS — E669 Obesity, unspecified: Secondary | ICD-10-CM | POA: Diagnosis present

## 2023-04-14 DIAGNOSIS — Z951 Presence of aortocoronary bypass graft: Secondary | ICD-10-CM | POA: Diagnosis not present

## 2023-04-14 DIAGNOSIS — Z79890 Hormone replacement therapy (postmenopausal): Secondary | ICD-10-CM | POA: Diagnosis not present

## 2023-04-14 DIAGNOSIS — E119 Type 2 diabetes mellitus without complications: Secondary | ICD-10-CM

## 2023-04-14 DIAGNOSIS — Z954 Presence of other heart-valve replacement: Secondary | ICD-10-CM | POA: Diagnosis not present

## 2023-04-14 DIAGNOSIS — Z833 Family history of diabetes mellitus: Secondary | ICD-10-CM

## 2023-04-14 DIAGNOSIS — Z7982 Long term (current) use of aspirin: Secondary | ICD-10-CM

## 2023-04-14 DIAGNOSIS — Z85828 Personal history of other malignant neoplasm of skin: Secondary | ICD-10-CM | POA: Diagnosis not present

## 2023-04-14 HISTORY — DX: Presence of prosthetic heart valve: Z95.2

## 2023-04-14 HISTORY — DX: Nonrheumatic aortic (valve) stenosis: I35.0

## 2023-04-14 HISTORY — PX: INTRAOPERATIVE TRANSTHORACIC ECHOCARDIOGRAM: SHX6523

## 2023-04-14 HISTORY — PX: TRANSCATHETER AORTIC VALVE REPLACEMENT, TRANSFEMORAL: SHX6400

## 2023-04-14 HISTORY — DX: Prediabetes: R73.03

## 2023-04-14 LAB — POCT I-STAT, CHEM 8
BUN: 18 mg/dL (ref 8–23)
Calcium, Ion: 1.15 mmol/L (ref 1.15–1.40)
Chloride: 105 mmol/L (ref 98–111)
Creatinine, Ser: 1 mg/dL (ref 0.61–1.24)
Glucose, Bld: 154 mg/dL — ABNORMAL HIGH (ref 70–99)
HCT: 41 % (ref 39.0–52.0)
Hemoglobin: 13.9 g/dL (ref 13.0–17.0)
Potassium: 4.4 mmol/L (ref 3.5–5.1)
Sodium: 139 mmol/L (ref 135–145)
TCO2: 21 mmol/L — ABNORMAL LOW (ref 22–32)

## 2023-04-14 LAB — ECHOCARDIOGRAM LIMITED
AR max vel: 2.97 cm2
AV Area VTI: 2.62 cm2
AV Area mean vel: 1.75 cm2
AV Mean grad: 4 mmHg
AV Peak grad: 7.6 mmHg
Ao pk vel: 1.38 m/s

## 2023-04-14 LAB — GLUCOSE, CAPILLARY
Glucose-Capillary: 99 mg/dL (ref 70–99)
Glucose-Capillary: 113 mg/dL — ABNORMAL HIGH (ref 70–99)

## 2023-04-14 LAB — POCT ACTIVATED CLOTTING TIME: Activated Clotting Time: 379 s

## 2023-04-14 LAB — ABO/RH: ABO/RH(D): O POS

## 2023-04-14 SURGERY — IMPLANTATION, AORTIC VALVE, TRANSCATHETER, FEMORAL APPROACH
Anesthesia: Monitor Anesthesia Care

## 2023-04-14 MED ORDER — CHLORHEXIDINE GLUCONATE 0.12 % MT SOLN
OROMUCOSAL | Status: AC
  Administered 2023-04-14: 15 mL via OROMUCOSAL
  Filled 2023-04-14: qty 15

## 2023-04-14 MED ORDER — CHLORHEXIDINE GLUCONATE 4 % EX SOLN: 60.0000 mL | Freq: Once | CUTANEOUS | Status: DC

## 2023-04-14 MED ORDER — ACETAMINOPHEN 650 MG RE SUPP: 650.0000 mg | Freq: Four times a day (QID) | RECTAL | Status: DC | PRN

## 2023-04-14 MED ORDER — SODIUM CHLORIDE 0.9 % IV SOLN: INTRAVENOUS | Status: AC

## 2023-04-14 MED ORDER — ROSUVASTATIN CALCIUM 20 MG PO TABS
20.0000 mg | ORAL_TABLET | Freq: Every day | ORAL | Status: DC
  Administered 2023-04-14: 20 mg via ORAL
  Filled 2023-04-14: qty 1

## 2023-04-14 MED ORDER — LEVOTHYROXINE SODIUM 50 MCG PO TABS
50.0000 ug | ORAL_TABLET | Freq: Every day | ORAL | Status: DC
  Administered 2023-04-15: 50 ug via ORAL
  Filled 2023-04-14: qty 1

## 2023-04-14 MED ORDER — ACETAMINOPHEN 325 MG PO TABS: 650.0000 mg | ORAL_TABLET | Freq: Four times a day (QID) | ORAL | Status: DC | PRN

## 2023-04-14 MED ORDER — SODIUM CHLORIDE 0.9% FLUSH: 3.0000 mL | INTRAVENOUS | Status: DC | PRN

## 2023-04-14 MED ORDER — TRAMADOL HCL 50 MG PO TABS: 50.0000 mg | ORAL_TABLET | ORAL | Status: DC | PRN

## 2023-04-14 MED ORDER — MORPHINE SULFATE (PF) 2 MG/ML IV SOLN: 1.0000 mg | INTRAVENOUS | Status: DC | PRN

## 2023-04-14 MED ORDER — CHLORHEXIDINE GLUCONATE 0.12 % MT SOLN: 15.0000 mL | Freq: Once | OROMUCOSAL | Status: AC

## 2023-04-14 MED ORDER — EMPAGLIFLOZIN 10 MG PO TABS
10.0000 mg | ORAL_TABLET | Freq: Every day | ORAL | Status: DC
  Administered 2023-04-14 – 2023-04-15 (×2): 10 mg via ORAL
  Filled 2023-04-14 (×2): qty 1

## 2023-04-14 MED ORDER — CEFAZOLIN SODIUM-DEXTROSE 2-4 GM/100ML-% IV SOLN
2.0000 g | Freq: Three times a day (TID) | INTRAVENOUS | Status: AC
  Administered 2023-04-14 (×2): 2 g via INTRAVENOUS
  Filled 2023-04-14 (×2): qty 100

## 2023-04-14 MED ORDER — HEPARIN SODIUM (PORCINE) 1000 UNIT/ML IJ SOLN
INTRAMUSCULAR | Status: AC
  Filled 2023-04-14: qty 20

## 2023-04-14 MED ORDER — HEPARIN SODIUM (PORCINE) 1000 UNIT/ML IJ SOLN
INTRAMUSCULAR | Status: DC | PRN
Start: 2023-04-14 — End: 2023-04-14
  Administered 2023-04-14: 15000 [IU] via INTRAVENOUS

## 2023-04-14 MED ORDER — ISOSORBIDE MONONITRATE ER 30 MG PO TB24
15.0000 mg | ORAL_TABLET | Freq: Every day | ORAL | Status: DC
  Administered 2023-04-14 – 2023-04-15 (×2): 15 mg via ORAL
  Filled 2023-04-14 (×2): qty 1

## 2023-04-14 MED ORDER — SODIUM CHLORIDE 0.9 % IV SOLN: 250.0000 mL | INTRAVENOUS | Status: DC | PRN

## 2023-04-14 MED ORDER — CHLORHEXIDINE GLUCONATE 4 % EX SOLN
30.0000 mL | CUTANEOUS | Status: DC
  Filled 2023-04-14: qty 30

## 2023-04-14 MED ORDER — ONDANSETRON HCL 4 MG/2ML IJ SOLN
INTRAMUSCULAR | Status: DC | PRN
  Administered 2023-04-14: 4 mg via INTRAVENOUS

## 2023-04-14 MED ORDER — SODIUM CHLORIDE 0.9 % IV SOLN: INTRAVENOUS | Status: DC

## 2023-04-14 MED ORDER — SODIUM CHLORIDE 0.9% FLUSH
3.0000 mL | Freq: Two times a day (BID) | INTRAVENOUS | Status: DC
  Administered 2023-04-14: 3 mL via INTRAVENOUS

## 2023-04-14 MED ORDER — INSULIN ASPART 100 UNIT/ML IJ SOLN: 0.0000 [IU] | Freq: Three times a day (TID) | INTRAMUSCULAR | Status: DC

## 2023-04-14 MED ORDER — IODIXANOL 320 MG/ML IV SOLN
INTRAVENOUS | Status: DC | PRN
  Administered 2023-04-14: 30 mL via INTRA_ARTERIAL

## 2023-04-14 MED ORDER — LIDOCAINE HCL (PF) 1 % IJ SOLN
INTRAMUSCULAR | Status: DC | PRN
  Administered 2023-04-14 (×2): 10 mL

## 2023-04-14 MED ORDER — OXYCODONE HCL 5 MG PO TABS: 5.0000 mg | ORAL_TABLET | ORAL | Status: DC | PRN

## 2023-04-14 MED ORDER — HYDRALAZINE HCL 25 MG PO TABS
25.0000 mg | ORAL_TABLET | Freq: Two times a day (BID) | ORAL | Status: DC
  Administered 2023-04-14 – 2023-04-15 (×2): 25 mg via ORAL
  Filled 2023-04-14 (×2): qty 1

## 2023-04-14 MED ORDER — ONDANSETRON HCL 4 MG/2ML IJ SOLN: 4.0000 mg | Freq: Four times a day (QID) | INTRAMUSCULAR | Status: DC | PRN

## 2023-04-14 MED ORDER — LIDOCAINE HCL (PF) 1 % IJ SOLN
INTRAMUSCULAR | Status: AC
  Filled 2023-04-14: qty 30

## 2023-04-14 MED ORDER — NITROGLYCERIN IN D5W 200-5 MCG/ML-% IV SOLN: 0.0000 ug/min | INTRAVENOUS | Status: DC

## 2023-04-14 MED ORDER — FENTANYL CITRATE (PF) 100 MCG/2ML IJ SOLN
INTRAMUSCULAR | Status: DC | PRN
  Administered 2023-04-14 (×3): 25 ug via INTRAVENOUS

## 2023-04-14 MED ORDER — LACTATED RINGERS IV SOLN: INTRAVENOUS | Status: DC

## 2023-04-14 MED ORDER — PROTAMINE SULFATE 10 MG/ML IV SOLN
INTRAVENOUS | Status: DC | PRN
Start: 2023-04-14 — End: 2023-04-14
  Administered 2023-04-14: 150 mg via INTRAVENOUS

## 2023-04-14 MED ORDER — ONDANSETRON HCL 4 MG/2ML IJ SOLN
INTRAMUSCULAR | Status: AC
  Filled 2023-04-14: qty 2

## 2023-04-14 MED ORDER — HEPARIN (PORCINE) IN NACL 1000-0.9 UT/500ML-% IV SOLN
INTRAVENOUS | Status: DC | PRN
  Administered 2023-04-14 (×3): 500 mL

## 2023-04-14 MED ORDER — ASPIRIN 81 MG PO TBEC
81.0000 mg | DELAYED_RELEASE_TABLET | Freq: Every day | ORAL | Status: DC
  Administered 2023-04-14 – 2023-04-15 (×2): 81 mg via ORAL
  Filled 2023-04-14 (×2): qty 1

## 2023-04-14 MED ORDER — FENTANYL CITRATE (PF) 100 MCG/2ML IJ SOLN
INTRAMUSCULAR | Status: AC
  Filled 2023-04-14: qty 2

## 2023-04-14 MED ORDER — FUROSEMIDE 10 MG/ML IJ SOLN
40.0000 mg | Freq: Once | INTRAMUSCULAR | Status: AC
  Administered 2023-04-14: 40 mg via INTRAVENOUS
  Filled 2023-04-14: qty 4

## 2023-04-14 SURGICAL SUPPLY — 31 items
BAG SNAP BAND KOVER 36X36 (MISCELLANEOUS) ×4 IMPLANT
CATH 26 ULTRA DELIVERY (CATHETERS) IMPLANT
CATH DIAG 6FR PIGTAIL ANGLED (CATHETERS) IMPLANT
CATH INFINITI 5FR ANG PIGTAIL (CATHETERS) IMPLANT
CATH INFINITI 6F AL2 (CATHETERS) IMPLANT
CATH S G BIP PACING (CATHETERS) IMPLANT
CATH-GARD ARROW CATH SHIELD (MISCELLANEOUS) ×1
CLOSURE MYNX CONTROL 6F/7F (Vascular Products) IMPLANT
CLOSURE PERCLOSE PROSTYLE (VASCULAR PRODUCTS) IMPLANT
COVER DOME SNAP 22 D (MISCELLANEOUS) IMPLANT
CRIMPER (MISCELLANEOUS) IMPLANT
DEVICE INFLATION ATRION QL2530 (MISCELLANEOUS) IMPLANT
KIT MICROPUNCTURE NIT STIFF (SHEATH) IMPLANT
KIT SAPIAN 3 ULTRA RESILIA 26 (Valve) IMPLANT
PACK CARDIAC CATHETERIZATION (CUSTOM PROCEDURE TRAY) ×2 IMPLANT
SET ATX-X65L (MISCELLANEOUS) IMPLANT
SHEATH BRITE TIP 7FR 35CM (SHEATH) IMPLANT
SHEATH INTRODUCER SET 20-26 (SHEATH) IMPLANT
SHEATH PINNACLE 6F 10CM (SHEATH) IMPLANT
SHEATH PINNACLE 8F 10CM (SHEATH) IMPLANT
SHEATH PROBE COVER 6X72 (BAG) IMPLANT
SHIELD CATHGARD ARROW (MISCELLANEOUS) IMPLANT
STOPCOCK MORSE 400PSI 3WAY (MISCELLANEOUS) ×4 IMPLANT
TRANSDUCER W/STOPCOCK (MISCELLANEOUS) ×4 IMPLANT
TUBING ART PRESS 72 MALE/FEM (TUBING) IMPLANT
WIRE AMPLATZ SS-J .035X180CM (WIRE) IMPLANT
WIRE EMERALD 3MM-J .035X150CM (WIRE) IMPLANT
WIRE EMERALD 3MM-J .035X260CM (WIRE) IMPLANT
WIRE EMERALD ST .035X260CM (WIRE) IMPLANT
WIRE SAFARI SM CURVE 275 (WIRE) IMPLANT
WIRE TORQFLEX AUST .018X40CM (WIRE) IMPLANT

## 2023-04-14 NOTE — Transfer of Care (Signed)
Immediate Anesthesia Transfer of Care Note  Patient: Kenneth Randall  Procedure(s) Performed: Transcatheter Aortic Valve Replacement, Transfemoral INTRAOPERATIVE TRANSTHORACIC ECHOCARDIOGRAM  Patient Location: PACU  Anesthesia Type:MAC  Level of Consciousness: awake, alert , and oriented  Airway & Oxygen Therapy: Patient Spontanous Breathing  Post-op Assessment: Report given to RN, Post -op Vital signs reviewed and stable, and Patient moving all extremities X 4  Post vital signs: Reviewed and stable  Last Vitals:  Vitals Value Taken Time  BP 115/56 04/14/23 1151  Temp    Pulse 71 04/14/23 1154  Resp 22 04/14/23 1154  SpO2 96 % 04/14/23 1154  Vitals shown include unfiled device data.  Last Pain:  Vitals:   04/14/23 0849  PainSc: 0-No pain         Complications: There were no known notable events for this encounter.

## 2023-04-14 NOTE — Progress Notes (Signed)
Mobility Specialist Progress Note:   04/14/23 1624  Mobility  Activity Ambulated with assistance in hallway  Level of Assistance Contact guard assist, steadying assist  Assistive Device Front wheel walker  Distance Ambulated (ft) 300 ft  Activity Response Tolerated well  Mobility Referral Yes  $Mobility charge 1 Mobility  Mobility Specialist Start Time (ACUTE ONLY) 1614  Mobility Specialist Stop Time (ACUTE ONLY) 1624  Mobility Specialist Time Calculation (min) (ACUTE ONLY) 10 min   Post Mobility: 86 HR   Pt received standing bedside, agreeable to mobility per RN request. Pt denied any discomfort during ambulation, asymptomatic throughout. Pt returned to bed with call bell in reach and all needs met. Family present.   Leory Plowman  Mobility Specialist Please contact via [employer] office at [phone]

## 2023-04-14 NOTE — Consult Note (Signed)
Structural Heart Team Consult Note  History of Present Illness: 77 yo male with history of HTN, HLD, CAD s/p 5V CABG in 1999, chronic systolic CHF, ischemic cardiomyopathy and severe aortic stenosis. Echo July 2024 with LVEF 30-35% with severe low flow/low gradient aortic stenosis (mean gradient 20 mmHg, AVA 0.97 cm2, DI 0.25) and moderate to severe AI. Drop in EF noted from echo last year. Cardiac cath with 5/5 patent bypass grafts. Cardiac CT with anatomy suitable for 26 mm Edwards Sapien 3 Ultra THV with right femoral access. He has ongoing fatigue but no dyspnea or chest pain.   Primary Cardiologist: Branch,J Referring Cardiologist: Dominga Ferry  Past Medical History:  Diagnosis Date   Aortic stenosis    CAD (coronary artery disease)    Cancer (HCC)    skin   CHF (congestive heart failure) (HCC)    Hypertension    Ischemic cardiomyopathy    Pre-diabetes    PSVT (paroxysmal supraventricular tachycardia)     Past Surgical History:  Procedure Laterality Date   RIGHT/LEFT HEART CATH AND CORONARY/GRAFT ANGIOGRAPHY N/A 02/08/2020   Procedure: RIGHT/LEFT HEART CATH AND CORONARY/GRAFT ANGIOGRAPHY;  Surgeon: Kathleene Hazel, MD;  Location: [facility];  Service: Cardiovascular;  Laterality: N/A;   RIGHT/LEFT HEART CATH AND CORONARY/GRAFT ANGIOGRAPHY N/A 03/30/2023   Procedure: RIGHT/LEFT HEART CATH AND CORONARY/GRAFT ANGIOGRAPHY;  Surgeon: Kathleene Hazel, MD;  Location: [facility];  Service: Cardiovascular;  Laterality: N/A;   SKIN CANCER EXCISION      Current Facility-Administered Medications  Medication Dose Route Frequency Provider Last Rate Last Admin   [START ON 04/15/2023] 0.9 %  sodium chloride infusion   Intravenous Continuous Labaron Digirolamo, Nile Dear, MD       ceFAZolin (ANCEF) IVPB 2g/100 mL premix  2 g Intravenous To OR Kathleene Hazel, MD       chlorhexidine (HIBICLENS) 4 % liquid 2 Application  30 mL Topical UD Kathleene Hazel, MD        chlorhexidine (HIBICLENS) 4 % liquid 4 Application  60 mL Topical Once Kathleene Hazel, MD       And   [START ON 04/15/2023] chlorhexidine (HIBICLENS) 4 % liquid 4 Application  60 mL Topical Once Kathleene Hazel, MD       [START ON 04/15/2023] chlorhexidine (PERIDEX) 0.12 % solution 15 mL  15 mL Mouth/Throat Once Kathleene Hazel, MD       chlorhexidine (PERIDEX) 0.12 % solution            dexmedetomidine (PRECEDEX) 400 MCG/100ML (4 mcg/mL) infusion  0.1-0.7 mcg/kg/hr Intravenous To OR Kathleene Hazel, MD       heparin 30,000 units/NS 1000 mL solution for CELLSAVER   Other To OR Kathleene Hazel, MD       magnesium sulfate (IV Push/IM) injection 40 mEq  40 mEq Other To OR Kathleene Hazel, MD       norepinephrine (LEVOPHED) 4mg  in (0.016 mg/mL) premix infusion  0-10 mcg/min Intravenous To OR Kathleene Hazel, MD       potassium chloride injection 80 mEq  80 mEq Other To OR Kathleene Hazel, MD        No Known Allergies  Social History   Socioeconomic History   Marital status: Married    Spouse name: Not on file   Number of children: Not on file   Years of education: Not on file   Highest education level: Not on file  Occupational History  Not on file  Tobacco Use   Smoking status: Former    Current packs/day: 0.00    Types: Cigarettes    Quit date: 01/17/1990    Years since quitting: 33.2   Smokeless tobacco: Never  Vaping Use   Vaping status: Never Used  Substance and Sexual Activity   Alcohol use: Yes    Comment: rarely - beer   Drug use: Never   Sexual activity: Not on file  Other Topics Concern   Not on file  Social History Narrative   Not on file   Social Determinants of Health   Financial Resource Strain: Not on file  Food Insecurity: Not on file  Transportation Needs: Not on file  Physical Activity: Not on file  Stress: Not on file  Social Connections: Not on file  Intimate Partner Violence:  Not on file    Family History  Problem Relation Age of Onset   Heart attack Mother 94   Rheum arthritis Mother    Diabetes Father    Heart disease Father        died after bypass surgery   Cancer Brother        jaw    Stroke Paternal Grandmother     Review of Systems:  As stated in the HPI and otherwise negative.   BP (!) 132/91   Pulse 82   Temp 98.9 F (37.2 C)   Resp 20   Ht 5\' 10"  (1.778 m)   Wt 99.8 kg   SpO2 97%   BMI 31.57 kg/m   Physical Examination: General: Well developed, well nourished, NAD  HEENT: OP clear, mucus membranes moist  SKIN: warm, dry. No rashes. Neuro: No focal deficits  Musculoskeletal: Muscle strength 5/5 all ext  Psychiatric: Mood and affect normal  Neck: No JVD, no carotid bruits, no thyromegaly, no lymphadenopathy.  Lungs:Clear bilaterally, no wheezes, rhonci, crackles Cardiovascular: Regular rate and rhythm. Loud, harsh, late peaking systolic murmur.  Abdomen:Soft. Bowel sounds present. Non-tender.  Extremities: No lower extremity edema. Pulses are 2 + in the bilateral DP/PT.  ECHOCARDIOGRAM REPORT       Patient Name:   [facility] Date of Exam: 02/13/2023 Medical Rec #:  [id number]      Height:       69.5 in Accession #:    [id number]     Weight:       225.2 lb Date of Birth:  1945/12/03     BSA:          2.184 m Patient Age:    76 years       BP:           130/70 mmHg Patient Gender: M              HR:           79 bpm. [facility]  Procedure: 2D Echo, Cardiac Doppler and Color Doppler  Indications:    Aortic stenosis I35.0   History:        Patient has prior history of Echocardiogram examinations, most                 recent 02/27/2022. Cardiomyopathy and CHF, Arrythmias:Atrial                 arrhythmia; Risk Factors:Former Smoker, Hypertension, Diabetes                 and Dyslipidemia.   Sonographer:    Celesta Gentile RCS  Referring Phys: [id number] Dorothe Pea BRANCH  IMPRESSIONS    1. Left  ventricular ejection fraction, by estimation, is 30 to 35%. The left ventricle has moderately decreased function. The left ventricle demonstrates global hypokinesis. The left ventricular internal cavity size was mildly dilated. Left ventricular diastolic parameters are consistent with Grade II diastolic dysfunction (pseudonormalization). Elevated left atrial pressure.  2. Right ventricular systolic function is normal. The right ventricular size is normal. Tricuspid regurgitation signal is inadequate for assessing PA pressure.  3. Left atrial size was severely dilated.  4. The mitral valve is normal in structure. Trivial mitral valve regurgitation. No evidence of mitral stenosis.  5. Moderate to severe low flow low gradient aortic stenosis. AVA VTI 0.96, mean gradient 20, DI 0.25. The aortic valve has an indeterminant number of cusps. There is moderate calcification of the aortic valve. There is moderate thickening of the aortic valve. Aortic valve regurgitation is moderate to severe.  6. The inferior vena cava is normal in size with greater than 50% respiratory variability, suggesting right atrial pressure of 3 mmHg.  FINDINGS  Left Ventricle: Global hypokinesis, the apex is akinetic. Left ventricular ejection fraction, by estimation, is 30 to 35%. The left ventricle has moderately decreased function. The left ventricle demonstrates global hypokinesis. Definity contrast agent was given IV to delineate the left ventricular endocardial borders. The left ventricular internal cavity size was mildly dilated. There is no left ventricular hypertrophy. Left ventricular diastolic parameters are consistent with Grade II diastolic dysfunction (pseudonormalization). Elevated left atrial pressure.  Right Ventricle: The right ventricular size is normal. Right vetricular wall thickness was not well visualized. Right ventricular systolic function is normal. Tricuspid regurgitation signal is  inadequate for assessing PA pressure.  Left Atrium: Left atrial size was severely dilated.  Right Atrium: Right atrial size was normal in size.  Pericardium: There is no evidence of pericardial effusion.  Mitral Valve: The mitral valve is normal in structure. Trivial mitral valve regurgitation. No evidence of mitral valve stenosis.  Tricuspid Valve: The tricuspid valve is normal in structure. Tricuspid valve regurgitation is not demonstrated. No evidence of tricuspid stenosis.  Aortic Valve: Moderate to severe low flow low gradient aortic stenosis. AVA VTI 0.96, mean gradient 20, DI 0.25. The aortic valve has an indeterminant number of cusps. There is moderate calcification of the aortic valve. There is moderate thickening of the aortic valve. There is moderate aortic valve annular calcification. Aortic valve regurgitation is moderate to severe. Aortic valve mean gradient measures 20.0 mmHg. Aortic valve peak gradient measures 29.8 mmHg. Aortic valve area, by VTI measures 0.96 cm.  Pulmonic Valve: The pulmonic valve was not well visualized. Pulmonic valve regurgitation is not visualized. No evidence of pulmonic stenosis.  Aorta: The aortic root is normal in size and structure.  Venous: The inferior vena cava is normal in size with greater than 50% respiratory variability, suggesting right atrial pressure of 3 mmHg.  IAS/Shunts: No atrial level shunt detected by color flow Doppler.    LEFT VENTRICLE PLAX 2D LVIDd:         6.40 cm   Diastology LVIDs:         4.90 cm   LV e' medial:    4.13 cm/s LV PW:         0.90 cm   LV E/e' medial:  26.2 LV IVS:        0.80 cm   LV e' lateral:   6.31 cm/s LVOT diam:     2.20 cm  LV E/e' lateral: 17.1 LV SV:         66 LV SV Index:   30 LVOT Area:     3.80 cm    RIGHT VENTRICLE RV S prime:     9.68 cm/s TAPSE (M-mode): 2.0 cm  LEFT ATRIUM              Index        RIGHT ATRIUM           Index LA diam:        4.50 cm  2.06  cm/m   RA Area:     20.00 cm LA Vol (A2C):   105.0 ml 48.08 ml/m  RA Volume:   54.00 ml  24.73 ml/m LA Vol (A4C):   99.9 ml  45.75 ml/m LA Biplane Vol: 107.0 ml 49.00 ml/m  AORTIC VALVE AV Area (Vmax):    1.05 cm AV Area (Vmean):   0.93 cm AV Area (VTI):     0.96 cm AV Vmax:           273.00 cm/s AV Vmean:          211.000 cm/s AV VTI:            0.687 m AV Peak Grad:      29.8 mmHg AV Mean Grad:      20.0 mmHg LVOT Vmax:         75.70 cm/s LVOT Vmean:        51.400 cm/s LVOT VTI:          0.173 m LVOT/AV VTI ratio: 0.25   AORTA Ao Root diam: 3.50 cm  MITRAL VALVE MV Area (PHT): 4.31 cm     SHUNTS MV Decel Time: 176 msec     Systemic VTI:  0.17 m MV E velocity: 108.00 cm/s  Systemic Diam: 2.20 cm MV A velocity: 88.70 cm/s MV E/A ratio:  1.22  Dina Rich MD Electronically signed by Dina Rich MD Signature Date/Time: 02/13/2023/12:08:28 PM       Final        Physicians   Panel Physicians Referring Physician Case Authorizing Physician  Kathleene Hazel, MD (Primary)        Procedures   RIGHT/LEFT HEART CATH AND CORONARY/GRAFT ANGIOGRAPHY    Conclusion       Ost LAD to Prox LAD lesion is 100% stenosed.   Mid LAD to Dist LAD lesion is 50% stenosed.   Prox Cx lesion is 100% stenosed.   Prox RCA to Mid RCA lesion is 50% stenosed.   Mid RCA lesion is 100% stenosed.   SVG and is normal in caliber.   SVG and is normal in caliber.    1. Severe triple vessel CAD s/p 5V CABG with 5/5 patent bypass grafts.  2. The LAD is known to be occluded proximally (not engaged today). The LAD and diagonal fill from the patent vein graft. The LIMA was not used for bypass.  3. The Circumflex is occluded in the proximal to mid vessel, known from prior cath and not engaged today. The patent sequential vein graft fills the proximal and distal OM branches 4. The RCA is occluded in the mid to distal vessel. The distal RCA/PDA and PLA fills from left to right  collaterals and from the patent vein graft 5. Elevated right heart pressures   Recommendations: Medical management of CAD. Will continue workup for TAVR. We will arrange CT scans next and then get him in to see Dr.  Bartle. He will increase his Lasix to 40 mg daily.    Indications   Congestive dilated cardiomyopathy (HCC) [I42.0 (ICD-10-CM)]  Severe aortic stenosis [I35.0 (ICD-10-CM)]    Procedural Details   Technical Details Indication: Cardiomyopathy, severe low flow/low gradient aortic stenosis, CAD s/p 5V CABG  Procedure: The risks, benefits, complications, treatment options, and expected outcomes were discussed with the patient. The patient and/or family concurred with the proposed plan, giving informed consent. The patient was sedated with Versed and Fentanyl. The IV catheter in the right antecubital vein was changed for a 5 [place] sheath. Right heart catheterization performed with a balloon tipped catheter. The left wrist was prepped and draped in a sterile fashion. 1% lidocaine was used for local anesthesia. Using the modified Seldinger access technique, a 5 French sheath was placed in the left radial artery. 3 mg Verapamil was given through the sheath. Weight based IV heparin was given. Standard diagnostic catheters were used to perform selective coronary angiography. I did not engage the left main as his LAD and circumflex are known to be occluded proximally from cath 3 years ago. I engaged the RCA and all vein grafts with the JR4 catheter. As noted in prior cath, the LIMA was not used as a graft. I did not cross the aortic valve. All catheter exchanges were performed over an exchange length guidewire.   The sheath was removed from the left radial artery and a hemostasis band was applied at the arteriotomy site on the left wrist.      Estimated blood loss <50 mL.   During this procedure medications were administered to achieve and maintain moderate conscious sedation while the  patient's heart rate, blood pressure, and oxygen saturation were continuously monitored and I was present face-to-face 100% of this time.    Medications (Filter: Administrations occurring from 0739 to 0835 on 03/30/23)  important  Continuous medications are totaled by the amount administered until 03/30/23 0835.    fentaNYL (SUBLIMAZE) injection (mcg)  Total dose: 25 mcg Date/Time Rate/Dose/Volume Action    03/30/23 0742 25 mcg Given    midazolam (VERSED) injection (mg)  Total dose: 1 mg Date/Time Rate/Dose/Volume Action    03/30/23 0743 0.5 mg Given    0747 0.5 mg Given    Heparin (Porcine) in NaCl 1000-0.9 UT/500ML-% SOLN (mL)  Total volume: 1,000 mL Date/Time Rate/Dose/Volume Action    03/30/23 0745 500 mL Given    0745 500 mL Given    lidocaine (PF) (XYLOCAINE) 1 % injection (mL)  Total volume: 4 mL Date/Time Rate/Dose/Volume Action    03/30/23 0752 2 mL Given    0757 2 mL Given    Radial Cocktail/Verapamil only (mL)  Total volume: 10 mL Date/Time Rate/Dose/Volume Action    03/30/23 0804 10 mL Given    heparin sodium (porcine) injection (Units)  Total dose: 5,000 Units Date/Time Rate/Dose/Volume Action    03/30/23 0806 5,000 Units Given    iohexol (OMNIPAQUE) 350 MG/ML injection (mL)  Total volume: 30 mL Date/Time Rate/Dose/Volume Action    03/30/23 0824 30 mL Given      Sedation Time   Sedation Time Physician-1: 34 minutes 3 seconds Contrast        Administrations occurring from 0739 to 0835 on 03/30/23:  Medication Name Total Dose  iohexol (OMNIPAQUE) 350 MG/ML injection 30 mL    Radiation/Fluoro   Fluoro time: 7.5 (min) DAP: 15872 (mGycm2) Cumulative Air Kerma: 257 (mGy) Coronary Findings   Diagnostic Dominance: Right Left Anterior Descending  Vessel  is large.  Ost LAD to Prox LAD lesion is 100% stenosed. The lesion is chronically occluded.  Mid LAD to Dist LAD lesion is 50% stenosed.    Left Circumflex  Vessel is large.  Prox Cx lesion is 100%  stenosed. The lesion is chronically occluded.    Right Coronary Artery  Vessel is large.  Prox RCA to Mid RCA lesion is 50% stenosed.  Mid RCA lesion is 100% stenosed. The lesion is chronically occluded.    Third Right Posterolateral Branch  Collaterals  3rd RPL filled by collaterals from 2nd Sept.       Saphenous Graft To RPDA  SVG and is normal in caliber.    Sequential Saphenous Graft To 2nd Diag, Dist LAD  SVG and is normal in caliber.    Sequential Graft To 1st Mrg, 3rd Mrg    Intervention    No interventions have been documented.    Coronary Diagrams   Diagnostic Dominance: Right  Intervention    Implants    No implant documentation for this case.    Syngo Images    Show images for CARDIAC CATHETERIZATION Images on Long Term Storage    Show images for Milner, Brenneke "Rosanne Ashing" Link to Procedure Log   Procedure Log    Link to Procedure Log   Procedure Log    Hemo Data   Flowsheet Row Most Recent Value  Fick Cardiac Output 4.46 L/min  Fick Cardiac Output Index 2.05 (L/min)/BSA  RA A Wave 11 mmHg  RA V Wave 10 mmHg  RA Mean 8 mmHg  RV Systolic Pressure 72 mmHg  RV Diastolic Pressure 3 mmHg  RV EDP 10 mmHg  PA Systolic Pressure 67 mmHg  PA Diastolic Pressure 28 mmHg  PA Mean 43 mmHg  PW A Wave 28 mmHg  PW V Wave 45 mmHg  PW Mean 30 mmHg  AO Systolic Pressure 144 mmHg  AO Diastolic Pressure 76 mmHg  AO Mean 108 mmHg  QP/QS 1  TPVR Index 20.99 HRUI  TSVR Index 52.71 HRUI  PVR SVR Ratio 0.13  TPVR/TSVR Ratio 0.4      ADDENDUM REPORT: 04/02/2023 11:35   EXAM: OVER-READ INTERPRETATION  CT CHEST   The following report is an over-read performed by radiologist Dr. Jacob Moores [facility] on 04/02/2023. This over-read does not include interpretation of cardiac or coronary anatomy or pathology. The cardiac TAVR interpretation by the cardiologist is attached.   COMPARISON:  None.   FINDINGS: Extracardiac findings will be  described separately under dictation for contemporaneously obtained CTA chest, abdomen and pelvis.   IMPRESSION: Please see separate dictation for contemporaneously obtained CTA chest, abdomen and pelvis dated March 12, 2023 for full description of relevant extracardiac findings.     Electronically Signed   By: Allegra Lai M.D.   On: 04/02/2023 11:35    Addended by Renford Dills, MD on 04/02/2023 11:38 AM    Study Result   Narrative & Impression  CLINICAL DATA:  Aortic Valve pathology with assessment for TAVR   EXAM: Cardiac TAVR CT   TECHNIQUE: The patient was scanned on a Siemens Force 192 slice scanner. A 120 kV retrospective scan was triggered in the descending thoracic aorta at 111 HU's. Gantry rotation speed was 270 msecs and collimation was .9 mm. No beta blockade or nitro were given. The 3D data set was reconstructed in 5% intervals of the R-R cycle. Systolic and diastolic phases were analyzed on a dedicated work station using MPR,  MIP and VRT modes. The patient received 95 cc of contrast.   FINDINGS: Aortic Valve: Severely thickened tri-leaflet aortic valve with moderate calcification and reduced excursion the planimeter valve area is 1.28 Sq cm consistent with moderate to severe aortic stenosis   LVOT calcification: None   Annular calcification: None   Aortic Valve Calcium Score: 776   Presence of severe basal septal hypertrophy: No   Perimembranous septal diameter: 5.8 mm   Mitral Valve: Mild mitral annular calcification. 2D MVA 5.91 cm2. Mild thickening.   Aortic Annulus Measurements- 25%   Major annulus diameter: 26 mm   Minor annulus diameter: 23 mm   Annular perimeter: 76 mm   Annular area: 4.35 cm2   Aortic Root Measurements- 70% phase   Sinotubular Junction: 38 mm   Ascending Thoracic Aorta: 40 mm   Aortic Arch: 27 mm   Descending Thoracic Aorta: 29 mm   Aortic atherosclerosis.   Sinus of Valsalva Measurements:    Right coronary cusp width: 31 mm   Left coronary cusp width: 31 mm   Non coronary cusp width: 30 mm   Coronary Artery Height above Annulus:   Left main: 9 mm   Left SoV height: 19 mm   Right Coronary: 18 mm   Right SoV height: 21 mm   Optimum Fluoroscopic Angle for Delivery: LAO 5, CAU 7   Cusp overlay view angle: RAO 0, CAU 11   Valves for structural team consideration: Low end for a 26 mm Sapien Valve. Recommended over 29 mm Evolut Valve, though left system has been bypassed.   Non TAVR Valve Findings:   Coronary Arteries: Normal coronary origin. Study not completed with nitroglycerin. S/p CABG. Calcium score deferred. Vein graft markers noted.   Systemic veins:Normal IVC, SVC, and coronary sinus anatomy   Main Pulmonary artery: Severe dilation, 36 mm. This can be seen in pulmonary hypertension.   Pulmonic valve: Tri-leaflet valve.  Mild thickening.   Pulmonary veins: Normal variant pulmonary vein- right middle pulmonary vein.   Left atrial appendage: Patent   Interatrial septum: Grossly no communications.   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 No evidence of thrombus.   Left atrium: Grossly normal   Right ventricle: Normal size   Right atrium: Grossly normal   Pericardium: Rare calcification over the RV anterior pericardium.   Extra Cardiac Findings as per separate reporting.   IMPRESSION: 1. Moderate to severe aortic stenosis. Findings pertinent to TAVR procedure are detailed above.   RECOMMENDATIONS:   The proposed cut-off value of 1,651 AU yielded a 93 % sensitivity and 75 % specificity in grading AS severity in patients with classical low-flow, low-gradient AS. Proposed different cut-off values to define severe AS for men and women as 2,065 AU and 1,274 AU, respectively. The joint European and American recommendations for the assessment of AS consider the aortic valve calcium score as a  continuum - a very high calcium score suggests severe AS and a low calcium score suggests severe AS is unlikely.   Sunday Shams, et al. 2017 ESC/EACTS Guidelines for the management of valvular heart disease. Eur Heart J [phone]   Coronary artery calcium (CAC) score is a strong predictor of incident coronary heart disease (CHD) and provides predictive information beyond traditional risk factors. CAC scoring is reasonable to use in the decision to withhold, postpone, or initiate statin therapy in intermediate-risk or selected borderline-risk asymptomatic adults (age 19-75 years and LDL-C >=70 to <190 mg/dL) who  do not have diabetes or established atherosclerotic cardiovascular disease (ASCVD).* In intermediate-risk (10-year ASCVD risk >=7.5% to <20%) adults or selected borderline-risk (10-year ASCVD risk >=5% to <7.5%) adults in whom a CAC score is measured for the purpose of making a treatment decision the following recommendations have been made:   If CAC = 0, it is reasonable to withhold statin therapy and reassess in 5 to 10 years, as long as higher risk conditions are absent (diabetes mellitus, family history of premature CHD in first degree relatives (males <55 years; females <65 years), cigarette smoking, LDL >=190 mg/dL or other independent risk factors).   If CAC is 1 to 99, it is reasonable to initiate statin therapy for patients >=78 years of age.   If CAC is >=100 or >=75th percentile, it is reasonable to initiate statin therapy at any age.   Cardiology referral should be considered for patients with CAC scores >=400 or >=75th percentile.   *2018 AHA/ACC/AACVPR/AAPA/ABC/ACPM/ADA/AGS/APhA/ASPC/NLA/PCNA Guideline on the Management of Blood Cholesterol: A Report of the American College of Cardiology/American Heart Association Task Force on Clinical Practice Guidelines. J Am Coll Cardiol. 2019;73(24):3168-3209.   Mahesh  Chandrasekhar    Electronically Signed: By: Riley Lam M.D. On: 04/01/2023 15:42          Narrative & Impression  CLINICAL DATA:  Preop evaluation for aortic valve replacement   EXAM: CT ANGIOGRAPHY CHEST, ABDOMEN AND PELVIS   TECHNIQUE: Non-contrast CT of the chest was initially obtained.   Multidetector CT imaging through the chest, abdomen and pelvis was performed using the standard protocol during bolus administration of intravenous contrast. Multiplanar reconstructed images and MIPs were obtained and reviewed to evaluate the vascular anatomy.   RADIATION DOSE REDUCTION: This exam was performed according to the departmental dose-optimization program which includes automated exposure control, adjustment of the mA and/or kV according to patient size and/or use of iterative reconstruction technique.   CONTRAST:  95mL OMNIPAQUE IOHEXOL 350 MG/ML SOLN   COMPARISON:  None Available.   FINDINGS: CTA CHEST FINDINGS   Cardiovascular: Normal heart size. 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 Thickening and calcifications of the aortic valve. No pericardial effusion. Normal caliber thoracic aorta with moderate atherosclerotic disease. Coronary artery calcifications status post CABG.   Mediastinum/Nodes: Esophagus and thyroid are unremarkable. Mildly enlarged subcarinal lymph node measuring 14 mm in short axis, likely reactive.   Lungs/Pleura: Central airways are patent. Scattered calcified pulmonary nodules. No consolidation, pleural effusion or pneumothorax.   Musculoskeletal: Prior median sternotomy. No chest wall abnormality. No acute or significant osseous findings.   CTA ABDOMEN AND PELVIS FINDINGS   Hepatobiliary: No focal liver abnormality is seen. No gallstones, gallbladder wall thickening, or biliary dilatation.   Pancreas: Unremarkable. No pancreatic ductal dilatation or surrounding inflammatory changes.   Spleen: Normal in size  without focal abnormality.   Adrenals/Urinary Tract: Bilateral adrenal glands are unremarkable. No hydronephrosis or nephrolithiasis. Bladder is unremarkable.   Stomach/Bowel: Stomach is within normal limits. No evidence of bowel wall thickening, distention, or inflammatory changes. Calcified exophytic lesion of the descending colon measuring 2.2 x 1.8 cm on series 4, image 43   Vascular/lymphatic: Normal caliber abdominal aorta with moderate atherosclerotic disease. No enlarged lymph nodes seen in the or pelvis.   Reproductive: Prostate is unremarkable.   Other: Small fat containing right inguinal hernia. No abdominopelvic ascites.   Musculoskeletal: No acute or significant osseous findings.   VASCULAR MEASUREMENTS PERTINENT TO TAVR:   AORTA:   Minimal Aortic  Diameter-16.0 mm   Severity of Aortic Calcification-moderate   RIGHT PELVIS:   Right Common Iliac Artery -   Minimal Diameter-6.1 mm   Tortuosity-mild   Calcification-moderate   Right External Iliac Artery -   Minimal Diameter-7.0 mm   Tortuosity-moderate   Calcification-moderate   Right Common Femoral Artery -   Minimal Diameter-6.1 mm   Tortuosity-none   Calcification-mild   LEFT PELVIS:   Left Common Iliac Artery -   Minimal Diameter-5.4 mm   Tortuosity-mild   Calcification-moderate   Left External Iliac Artery -   Minimal Diameter-5.0 mm   Tortuosity-moderate   Calcification-mild   Left Common Femoral Artery -   Minimal Diameter-5.6 mm   Tortuosity-none   Calcification-moderate   Review of the MIP images confirms the above findings.   IMPRESSION: 1. Vascular findings and measurements pertinent to potential TAVR procedure, as detailed above. 2. Thickening and calcification of the aortic valve, compatible with reported clinical history of aortic stenosis. 3. Moderate aortoiliac atherosclerosis. Coronary artery disease status post CABG. 4. 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 5. Calcified exophytic lesion of the descending colon measuring 2.2 cm, possibly sequela of prior fat infarction although neoplasm can not be excluded. Recommend comparison with more remote prior exams, if available, to ensure long-term stability. Alternatively, recommend follow-up CT of the abdomen and pelvis in 3 months to ensure stability.    Recent Labs: 04/10/2023: ALT 20; BUN 22; Creatinine, Ser 1.24; Hemoglobin 15.7; Platelets 276; Potassium 4.4; Sodium 139   Assessment and Plan:   1. Severe Aortic Valve Stenosis: He has severe, stage D2 low flow/low gradient aortic valve stenosis. He has NYHA class 2 symptoms. I have personally reviewed the echo images. The aortic valve is thickened and calcified with limited leaflet mobility. I think he would benefit from AVR. Given advanced age and prior open heart surgery, he is not a good candidate for conventional AVR by surgical approach. He is a good candidate for TAVR.    I have reviewed the natural history of aortic stenosis with the patient and their family members  who are present today. We have discussed the limitations of medical therapy and the poor prognosis associated with symptomatic aortic stenosis. We have reviewed potential treatment options, including palliative medical therapy, conventional surgical aortic valve replacement, and transcatheter aortic valve replacement. We discussed treatment options in the context of the patient's specific comorbid medical conditions.   Plan for TAVR from transfemoral approach.   Signed, Verne Carrow, MD, [facility] 04/14/2023 8:58 AM    [facility] [address] [place], [place], [place]  [address] Phone: [phone]; Fax: [phone]

## 2023-04-14 NOTE — Progress Notes (Signed)
Heart Failure Navigator Progress Note  Assessed for Heart & Vascular TOC clinic readiness.  Patient does not meet criteria due to more chronic HFrEF, TAVR 9/10..   Navigator will sign off at this time.    Nicola Quesnell,RN, BSN,MSN Heart Failure Nurse Navigator. Contact by secure chat only.

## 2023-04-14 NOTE — Discharge Instructions (Signed)

## 2023-04-14 NOTE — Anesthesia Postprocedure Evaluation (Signed)
Anesthesia Post Note  Patient: MOMEN GROSSO  Procedure(s) Performed: Transcatheter Aortic Valve Replacement, Transfemoral INTRAOPERATIVE TRANSTHORACIC ECHOCARDIOGRAM     Patient location during evaluation: PACU Anesthesia Type: MAC Level of consciousness: awake and alert Pain management: pain level controlled Vital Signs Assessment: post-procedure vital signs reviewed and stable Respiratory status: spontaneous breathing Cardiovascular status: stable Anesthetic complications: no   There were no known notable events for this encounter.  Last Vitals:  Vitals:   04/14/23 1235 04/14/23 1300  BP: (!) 98/58 107/64  Pulse: 64 67  Resp: (!) 21 18  Temp:  36.7 C  SpO2: 90% 98%    Last Pain:  Vitals:   04/14/23 1300  TempSrc: Oral  PainSc:                  Lewie Loron

## 2023-04-14 NOTE — Progress Notes (Unsigned)
HEART AND VASCULAR CENTER   MULTIDISCIPLINARY HEART VALVE CLINIC                                     Cardiology Office Note:    Date:  04/14/2023   ID:  TEVAUGHN SANTIZO, DOB 1946/07/10, MRN [id number]  PCP:  Kathleene Hazel, MD  [facility] Cardiologist:  Dina Rich, MD  [facility] Electrophysiologist:  None   Referring MD: Kathleene Hazel*   No chief complaint on file. ***  History of Present Illness:    Kenneth Randall is a 77 y.o. male with a hx of ***  Past Medical History:  Diagnosis Date   Aortic stenosis    CAD (coronary artery disease)    Cancer (HCC)    skin   Hypertension    Ischemic cardiomyopathy    Pre-diabetes    PSVT (paroxysmal supraventricular tachycardia)    S/P TAVR (transcatheter aortic valve replacement) 04/14/2023   s/p TAVR with a 26 mm Edwards S3UR via the TF approach by Dr. Clifton Adonijah & Dr. [name]    Past Surgical History:  Procedure Laterality Date   RIGHT/LEFT HEART CATH AND CORONARY/GRAFT ANGIOGRAPHY N/A 02/08/2020   Procedure: RIGHT/LEFT HEART CATH AND CORONARY/GRAFT ANGIOGRAPHY;  Surgeon: Kathleene Hazel, MD;  Location: [facility] INVASIVE CV LAB;  Service: Cardiovascular;  Laterality: N/A;   RIGHT/LEFT HEART CATH AND CORONARY/GRAFT ANGIOGRAPHY N/A 03/30/2023   Procedure: RIGHT/LEFT HEART CATH AND CORONARY/GRAFT ANGIOGRAPHY;  Surgeon: Kathleene Hazel, MD;  Location: [facility] INVASIVE CV LAB;  Service: Cardiovascular;  Laterality: N/A;   SKIN CANCER EXCISION      Current Medications: No outpatient medications have been marked as taking for the 04/22/23 encounter (Appointment) with [facility].      ROS:   Please see the history of present illness.    All other systems reviewed and are negative.  EKGs   EKG:  EKG is *** ordered today.  The ekg ordered today demonstrates ***  Recent Labs: 04/10/2023: ALT 20; Platelets 276 04/14/2023: BUN 18; Creatinine, Ser 1.00; Hemoglobin 13.9; Potassium  4.4; Sodium 139  Recent Lipid Panel No results found for: "CHOL", "TRIG", "HDL", "CHOLHDL", "VLDL", "LDLCALC", "LDLDIRECT"   Risk Assessment/Calculations:   {Does this patient have ATRIAL FIBRILLATION?:[phone]}    {This patient may be at risk for Amyloid.  Click HERE to open Cardiac Amyloid Screening SmartSet to order screening OR Click HERE to defer testing for 1 year or permanently :1}    Physical Exam:    VS:  There were no vitals taken for this visit.    Wt Readings from Last 3 Encounters:  04/14/23 220 lb (99.8 kg)  04/02/23 220 lb (99.8 kg)  03/30/23 220 lb (99.8 kg)     GEN: Well nourished, well developed in no acute distress NECK: No JVD; No carotid bruits CARDIAC: ***RRR, no murmurs, rubs, gallops RESPIRATORY:  Clear to auscultation without rales, wheezing or rhonchi  ABDOMEN: Soft, non-tender, non-distended EXTREMITIES:  No edema; No deformity   ASSESSMENT:    No diagnosis found. PLAN:    In order of problems listed above:        {Are you ordering a CV Procedure (e.g. stress test, cath, DCCV, TEE, etc)?   Press F2        :[id number]}    Medication Adjustments/Labs and Tests Ordered: Current medicines are reviewed at length with the patient today.  Concerns regarding medicines are outlined above.  No orders of the defined types were placed in this encounter.  No orders of the defined types were placed in this encounter.   There are no Patient Instructions on file for this visit.   Signed, Cline Crock, PA-C  04/14/2023 1:31 PM    [facility]

## 2023-04-14 NOTE — Op Note (Signed)
HEART AND VASCULAR CENTER   MULTIDISCIPLINARY HEART VALVE TEAM   TAVR OPERATIVE NOTE   Date of Procedure:  04/14/2023  Preoperative Diagnosis: Severe Aortic Stenosis and moderate to severe aortic insufficiency  Postoperative Diagnosis: Same   Procedure:   Transcatheter Aortic Valve Replacement - Percutaneous Right Transfemoral Approach  Edwards Sapien 3 Ultra Resilia THV (size 29 mm, model # 9755RSL, serial # [id number])   Co-Surgeons:  [name], MD and Verne Carrow, MD   Anesthesiologist:  Jolyne Loa, MD  Echocardiographer:  Lacretia Nicks. O'Neal, MD  Pre-operative Echo Findings: Severe aortic stenosis Moderate left ventricular systolic dysfunction  Post-operative Echo Findings: No paravalvular leak Unchanged moderate left ventricular systolic dysfunction   BRIEF CLINICAL NOTE AND INDICATIONS FOR SURGERY  This 77 year old gentleman has stage D3 severe, low flow/low gradient aortic stenosis and moderate to severe aortic insufficiency with NYHA class II symptoms of exertional fatigue.  He has had a reduction in his ejection fraction over time to 30 to 35% with global hypokinesis and a dilated left ventricle.  I have personally reviewed his 2D echocardiogram, cardiac catheterization, and CTA studies.  His echo shows a trileaflet aortic valve with moderate calcification and thickening and restricted leaflet mobility.  The mean gradient was measured at 20 mmHg with a dimensionless index of 0.25 and aortic valve area by VTI of 0.96 cm.  Stroke-volume index is low at 30.  There is moderate to severe aortic insufficiency.  His cardiac catheterization shows occlusion of his native coronary arteries and 5 out of 5 patent bypass grafts.  I agree that aortic valve replacement is indicated in this patient for relief of his symptoms and to prevent progressive left ventricular deterioration.  Given his age and prior coronary bypass surgery I think that transcatheter aortic valve  replacement would be the best option for treating him.  His gated cardiac CTA shows anatomy suitable for TAVR using a 26 mm SAPIEN 3 valve.  His abdominal and pelvic CTA shows adequate pelvic vascular anatomy to allow transfemoral insertion.   The patient and his wife were counseled at length regarding treatment alternatives for management of severe symptomatic aortic stenosis. The risks and benefits of surgical intervention has been discussed in detail. Long-term prognosis with medical therapy was discussed. Alternative approaches such as conventional surgical aortic valve replacement, transcatheter aortic valve replacement, and palliative medical therapy were compared and contrasted at length. This discussion was placed in the context of the patient's own specific clinical presentation and past medical history. All of their questions have been addressed.    Following the decision to proceed with transcatheter aortic valve replacement, a discussion was held regarding what types of management strategies would be attempted intraoperatively in the event of life-threatening complications, including whether or not the patient would be considered a candidate for the use of cardiopulmonary bypass and/or conversion to open sternotomy for attempted surgical intervention.  With prior coronary artery bypass graft surgery I do not think he is a candidate for emergent sternotomy to manage any intraoperative complications.  The patient has been advised of a variety of complications that might develop including but not limited to risks of death, stroke, paravalvular leak, aortic dissection or other major vascular complications, aortic annulus rupture, device embolization, cardiac rupture or perforation, mitral regurgitation, acute myocardial infarction, arrhythmia, heart block or bradycardia requiring permanent pacemaker placement, congestive heart failure, respiratory failure, renal failure, pneumonia, infection, other late  complications related to structural valve deterioration or migration, or other complications that might ultimately cause  a temporary or permanent loss of functional independence or other long term morbidity. The patient provides full informed consent for the procedure as described and all questions were answered.       DETAILS OF THE OPERATIVE PROCEDURE  PREPARATION:    The patient was brought to the operating room on the above mentioned date and appropriate monitoring was established by the anesthesia team. The patient was placed in the supine position on the operating table.  Intravenous antibiotics were administered. The patient was monitored closely throughout the procedure under conscious sedation.  Baseline transthoracic echocardiogram was performed. The patient's abdomen and both groins were prepped and draped in a sterile manner. A time out procedure was performed.   PERIPHERAL ACCESS:    Using the modified Seldinger technique, femoral arterial and venous access was obtained with placement of 6 Fr sheaths on the left side.  A pigtail diagnostic catheter was passed through the left arterial sheath under fluoroscopic guidance into the aortic root.  A temporary transvenous pacemaker catheter was passed through the left femoral venous sheath under fluoroscopic guidance into the right ventricle.  The pacemaker was tested to ensure stable lead placement and pacemaker capture. Aortic root angiography was performed in order to determine the optimal angiographic angle for valve deployment.   TRANSFEMORAL ACCESS:   Percutaneous transfemoral access and sheath placement was performed using ultrasound guidance.  The right common femoral artery was cannulated using a micropuncture needle and appropriate location was verified using hand injection angiogram.  A pair of Abbott Perclose percutaneous closure devices were placed and a 6 French sheath replaced into the femoral artery.  The patient was  heparinized systemically and ACT verified > 250 seconds.    A 14 Fr transfemoral E-sheath was introduced into the right common femoral artery after progressively dilating over an Amplatz superstiff wire. An AL-2 catheter was used to direct a straight-tip exchange length wire across the native aortic valve into the left ventricle. This was exchanged out for a pigtail catheter and position was confirmed in the LV apex. Simultaneous LV and Ao pressures were recorded.  The pigtail catheter was exchanged for a Safari wire in the LV apex.   BALLOON AORTIC VALVULOPLASTY:   Not performed   TRANSCATHETER HEART VALVE DEPLOYMENT:   An Edwards Sapien 3 Ultra transcatheter heart valve (size 26 mm) was prepared and crimped per manufacturer's guidelines, and the proper orientation of the valve is confirmed on the [employer] delivery system. The valve was advanced through the introducer sheath using normal technique until in an appropriate position in the abdominal aorta beyond the sheath tip. The balloon was then retracted and using the fine-tuning wheel was centered on the valve. The valve was then advanced across the aortic arch using appropriate flexion of the catheter. The valve was carefully positioned across the aortic valve annulus. The Commander catheter was retracted using normal technique. Once final position of the valve has been confirmed by angiographic assessment, the valve is deployed during rapid ventricular pacing to maintain systolic blood pressure < 50 mmHg and pulse pressure < 10 mmHg. The balloon inflation is held for >3 seconds after reaching full deployment volume. Once the balloon has fully deflated the balloon is retracted into the ascending aorta and valve function is assessed using echocardiography. There is felt to be no paravalvular leak and no central aortic insufficiency.  The patient's hemodynamic recovery following valve deployment is good.  The deployment balloon and guidewire  are both removed.    PROCEDURE  COMPLETION:   The sheath was removed and femoral artery closure performed.  Protamine was administered once femoral arterial repair was complete. The temporary pacemaker, pigtail catheter and femoral sheaths were removed with manual pressure used for venous hemostasis.  A Mynx femoral closure device was utilized following removal of the diagnostic sheath in the left femoral artery.  The patient tolerated the procedure well and is transported to the cath lab recovery area in stable condition. There were no immediate intraoperative complications. All sponge instrument and needle counts are verified correct at completion of the operation.   No blood products were administered during the operation.  The patient received a total of 30 mL of intravenous contrast during the procedure.   [name], MD 04/14/2023 12:41 PM

## 2023-04-14 NOTE — Interval H&P Note (Signed)
History and Physical Interval Note:  04/14/2023 10:04 AM  Kenneth Randall  has presented today for surgery, with the diagnosis of Severe Aortic Stenosis.  The various methods of treatment have been discussed with the patient and family. After consideration of risks, benefits and other options for treatment, the patient has consented to  Procedure(s): Transcatheter Aortic Valve Replacement, Transfemoral (N/A) INTRAOPERATIVE TRANSTHORACIC ECHOCARDIOGRAM (N/A) as a surgical intervention.  The patient's history has been reviewed, patient examined, no change in status, stable for surgery.  I have reviewed the patient's chart and labs.  Questions were answered to the patient's satisfaction.     [name]

## 2023-04-14 NOTE — Progress Notes (Signed)
  HEART AND VASCULAR CENTER   MULTIDISCIPLINARY HEART VALVE TEAM  Patient doing well s/p TAVR. He is hemodynamically stable. Groin sites stable. ECG with no high grade block. Transferred from cath lab holding to 4E. Early ambulation after bedrest completed and hopeful discharge over the next 24-48 hours.   Cline Crock PA-C  [facility]  Pager [phone]

## 2023-04-14 NOTE — Discharge Summary (Signed)
baby Asprin 81mg  daily. Walked with cardiac rehab with no issues. Plan for discharge home today with close follow up in the outpatient setting.   Acute on chronic HFrEF: as evidenced by an elevated LVEDP 25 at the time of TAVR. He was treated with IV Lasix 40mg  x1 and resumed on home Lasix 40mg  daily.   DMT2: treated with SSI while admitted. Resume home meds at discharge. Okay to resume Metformin after 48 hours after contrast dye exposure (9/12 PM)   CAD s/p CABG: pre TAVR cath showed Severe triple vessel CAD s/p 5V CABG with 5/5 patent bypass grafts. Continue medical therapy.  HTN: BP well controlled. Resumed on home meds.   Lesion of colon: pre TAVR CT showed a "calcified exophytic lesion of the descending colon measuring 2.2 cm, possibly sequela of prior fat infarction although neoplasm cannot be excluded. Recommend comparison with  more remote prior exams, if available, to ensure long-term stability. Alternatively, recommend follow-up CT of the abdomen and pelvis in 3 months to ensure stability." Will discuss in the outpatient setting.   _____________  Discharge Vitals Blood pressure (!) 140/62, pulse 89, temperature 98.2 F (36.8 C), temperature source Tympanic, resp. rate (!) 21, height 5\' 10"  (1.778 m), weight 97.9 kg, SpO2 96%.  Filed Weights   04/14/23 0749 04/15/23 0346  Weight: 99.8 kg 97.9 kg    GEN: Well nourished, well developed, in no acute distress HEENT: normal Neck: no JVD or masses Cardiac: RRR; no murmurs, rubs, or gallops,no edema  Respiratory:  clear to auscultation bilaterally, normal work of breathing GI: soft, nontender, nondistended, + BS MS: no deformity or atrophy Skin: warm and dry, no rash Neuro:  Alert and Oriented x 3, Strength and sensation are intact Psych: euthymic mood, full affect   Labs & Radiologic Studies    CBC Recent Labs    04/14/23 1140 04/15/23 0423  WBC  --  9.7  HGB 13.9 13.3  HCT 41.0 41.8  MCV  --  91.5  PLT  --  196   Basic Metabolic Panel Recent Labs    40/98/11 1140 04/15/23 0423  NA 139 135  K 4.4 4.1  CL 105 105  CO2  --  21*  GLUCOSE 154* 112*  BUN 18 17  CREATININE 1.00 1.16  CALCIUM  --  8.5*  MG  --  2.2   Liver Function Tests No results for input(s): "AST", "ALT", "ALKPHOS", "BILITOT", "PROT", "ALBUMIN" in the last 72 hours. No results for input(s): "LIPASE", "AMYLASE" in the last 72 hours. Cardiac Enzymes No results for input(s): "CKTOTAL", "CKMB", "CKMBINDEX", "TROPONINI" in the last 72 hours. BNP Invalid input(s): "POCBNP" D-Dimer No results for input(s): "DDIMER" in the last 72 hours. Hemoglobin A1C No results for input(s): "HGBA1C" in the last 72 hours. Fasting Lipid Panel No results for input(s): "CHOL", "HDL", "LDLCALC", "TRIG", "CHOLHDL", "LDLDIRECT" in the last 72 hours. Thyroid Function Tests No results for  input(s): "TSH", "T4TOTAL", "T3FREE", "THYROIDAB" in the last 72 hours.  Invalid input(s): "FREET3" _____________  ECHOCARDIOGRAM LIMITED  Result Date: 04/14/2023    ECHOCARDIOGRAM LIMITED REPORT   Patient Name:   Kenneth Randall [facility] Date of Exam: 04/14/2023 Medical Rec #:  [id number]      Height:       70.0 in Accession #:    [id number]     Weight:       220.0 lb Date of Birth:  [birth year]     [facility]   MULTIDISCIPLINARY HEART VALVE TEAM  Discharge Summary    Patient ID: Kenneth Randall MRN: [id number]; DOB: 03/22/1946  Admit date: 04/14/2023 Discharge date: 04/15/2023  Primary Care Provider: Kathleene Hazel, MD  Primary Cardiologist: Dina Rich, MD / Dr. Clifton Jovane & Dr. [name] (TAVR)  Discharge Diagnoses    Principal Problem:   S/P TAVR (transcatheter aortic valve replacement) Active Problems:   Ischemic cardiomyopathy   Acute on chronic HFrEF (heart failure with reduced ejection fraction) (HCC)   Essential (primary) hypertension   Dyslipidemia   Diabetes (HCC)   Psoriatic arthritis (HCC)   CAD (coronary artery disease)   Allergies No Known Allergies  Diagnostic Studies/Procedures    TAVR OPERATIVE NOTE     Date of Procedure:                04/14/2023   Preoperative Diagnosis:Severe Aortic Stenosis and moderate to severe aortic insufficiency   Postoperative Diagnosis:    Same    Procedure:        Transcatheter Aortic Valve Replacement - Percutaneous Right Transfemoral Approach             Edwards Sapien 3 Ultra Resilia THV (size 29 mm, model # 9755RSL, serial # [id number])              Co-Surgeons:                        [name], MD and Verne Carrow, MD     Anesthesiologist:                  Jolyne Loa, MD   Echocardiographer:              Lacretia Nicks. O'Neal, MD   Pre-operative Echo Findings: Severe aortic stenosis Moderate left ventricular systolic dysfunction   Post-operative Echo Findings: No paravalvular leak Unchanged moderate left ventricular systolic dysfunction  _____________    Echo 04/15/23: completed but pending formal read at the time of discharge   History of Present Illness     Kenneth Randall is a 77 y.o. 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   He was admitted to [facility] in 2021 with acute CHF and EF down to 20-25%. He was started on GDMT. His EF increased to 40 to 45% but he developed hyperkalemia and Entresto/spiro had to be discontinued. Repeat echo 02/2023 showed a drop in his ejection fraction to 30 to 35% with grade 2 diastolic dysfunction as well as severe LFLG AS and mod-severe AI. He was referred to Dr. Clifton Conrado for [facility] 03/30/23 which showed severe triple vessel CAD s/p 5V CABG with 5/5 patent bypass grafts as well as elevated right heart pressures. Lasix was increased from 20mg  to 40mg  daily.   The patient was evaluated by the multidisciplinary valve team and felt to have severe, symptomatic aortic valve disease and to be a suitable candidate for TAVR, which was set up for 04/14/23.   Hospital Course     Consultants: none   Severe AS/AI: s/p successful TAVR with a 26 mm Edwards Sapien 3 Ultra Resilia THV via the TF approach on 04/14/23. Post operative echo completed but pending formal read. Groin sites are stable. ECG with sinus and no high grade heart block. Resumed on home  baby Asprin 81mg  daily. Walked with cardiac rehab with no issues. Plan for discharge home today with close follow up in the outpatient setting.   Acute on chronic HFrEF: as evidenced by an elevated LVEDP 25 at the time of TAVR. He was treated with IV Lasix 40mg  x1 and resumed on home Lasix 40mg  daily.   DMT2: treated with SSI while admitted. Resume home meds at discharge. Okay to resume Metformin after 48 hours after contrast dye exposure (9/12 PM)   CAD s/p CABG: pre TAVR cath showed Severe triple vessel CAD s/p 5V CABG with 5/5 patent bypass grafts. Continue medical therapy.  HTN: BP well controlled. Resumed on home meds.   Lesion of colon: pre TAVR CT showed a "calcified exophytic lesion of the descending colon measuring 2.2 cm, possibly sequela of prior fat infarction although neoplasm cannot be excluded. Recommend comparison with  more remote prior exams, if available, to ensure long-term stability. Alternatively, recommend follow-up CT of the abdomen and pelvis in 3 months to ensure stability." Will discuss in the outpatient setting.   _____________  Discharge Vitals Blood pressure (!) 140/62, pulse 89, temperature 98.2 F (36.8 C), temperature source Tympanic, resp. rate (!) 21, height 5\' 10"  (1.778 m), weight 97.9 kg, SpO2 96%.  Filed Weights   04/14/23 0749 04/15/23 0346  Weight: 99.8 kg 97.9 kg    GEN: Well nourished, well developed, in no acute distress HEENT: normal Neck: no JVD or masses Cardiac: RRR; no murmurs, rubs, or gallops,no edema  Respiratory:  clear to auscultation bilaterally, normal work of breathing GI: soft, nontender, nondistended, + BS MS: no deformity or atrophy Skin: warm and dry, no rash Neuro:  Alert and Oriented x 3, Strength and sensation are intact Psych: euthymic mood, full affect   Labs & Radiologic Studies    CBC Recent Labs    04/14/23 1140 04/15/23 0423  WBC  --  9.7  HGB 13.9 13.3  HCT 41.0 41.8  MCV  --  91.5  PLT  --  196   Basic Metabolic Panel Recent Labs    40/98/11 1140 04/15/23 0423  NA 139 135  K 4.4 4.1  CL 105 105  CO2  --  21*  GLUCOSE 154* 112*  BUN 18 17  CREATININE 1.00 1.16  CALCIUM  --  8.5*  MG  --  2.2   Liver Function Tests No results for input(s): "AST", "ALT", "ALKPHOS", "BILITOT", "PROT", "ALBUMIN" in the last 72 hours. No results for input(s): "LIPASE", "AMYLASE" in the last 72 hours. Cardiac Enzymes No results for input(s): "CKTOTAL", "CKMB", "CKMBINDEX", "TROPONINI" in the last 72 hours. BNP Invalid input(s): "POCBNP" D-Dimer No results for input(s): "DDIMER" in the last 72 hours. Hemoglobin A1C No results for input(s): "HGBA1C" in the last 72 hours. Fasting Lipid Panel No results for input(s): "CHOL", "HDL", "LDLCALC", "TRIG", "CHOLHDL", "LDLDIRECT" in the last 72 hours. Thyroid Function Tests No results for  input(s): "TSH", "T4TOTAL", "T3FREE", "THYROIDAB" in the last 72 hours.  Invalid input(s): "FREET3" _____________  ECHOCARDIOGRAM LIMITED  Result Date: 04/14/2023    ECHOCARDIOGRAM LIMITED REPORT   Patient Name:   Kenneth Randall [facility] Date of Exam: 04/14/2023 Medical Rec #:  [id number]      Height:       70.0 in Accession #:    [id number]     Weight:       220.0 lb Date of Birth:  [birth year]     [facility]   MULTIDISCIPLINARY HEART VALVE TEAM  Discharge Summary    Patient ID: Kenneth Randall MRN: [id number]; DOB: 03/22/1946  Admit date: 04/14/2023 Discharge date: 04/15/2023  Primary Care Provider: Kathleene Hazel, MD  Primary Cardiologist: Dina Rich, MD / Dr. Clifton Jovane & Dr. [name] (TAVR)  Discharge Diagnoses    Principal Problem:   S/P TAVR (transcatheter aortic valve replacement) Active Problems:   Ischemic cardiomyopathy   Acute on chronic HFrEF (heart failure with reduced ejection fraction) (HCC)   Essential (primary) hypertension   Dyslipidemia   Diabetes (HCC)   Psoriatic arthritis (HCC)   CAD (coronary artery disease)   Allergies No Known Allergies  Diagnostic Studies/Procedures    TAVR OPERATIVE NOTE     Date of Procedure:                04/14/2023   Preoperative Diagnosis:Severe Aortic Stenosis and moderate to severe aortic insufficiency   Postoperative Diagnosis:    Same    Procedure:        Transcatheter Aortic Valve Replacement - Percutaneous Right Transfemoral Approach             Edwards Sapien 3 Ultra Resilia THV (size 29 mm, model # 9755RSL, serial # [id number])              Co-Surgeons:                        [name], MD and Verne Carrow, MD     Anesthesiologist:                  Jolyne Loa, MD   Echocardiographer:              Lacretia Nicks. O'Neal, MD   Pre-operative Echo Findings: Severe aortic stenosis Moderate left ventricular systolic dysfunction   Post-operative Echo Findings: No paravalvular leak Unchanged moderate left ventricular systolic dysfunction  _____________    Echo 04/15/23: completed but pending formal read at the time of discharge   History of Present Illness     Kenneth Randall is a 77 y.o. 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   He was admitted to [facility] in 2021 with acute CHF and EF down to 20-25%. He was started on GDMT. His EF increased to 40 to 45% but he developed hyperkalemia and Entresto/spiro had to be discontinued. Repeat echo 02/2023 showed a drop in his ejection fraction to 30 to 35% with grade 2 diastolic dysfunction as well as severe LFLG AS and mod-severe AI. He was referred to Dr. Clifton Conrado for [facility] 03/30/23 which showed severe triple vessel CAD s/p 5V CABG with 5/5 patent bypass grafts as well as elevated right heart pressures. Lasix was increased from 20mg  to 40mg  daily.   The patient was evaluated by the multidisciplinary valve team and felt to have severe, symptomatic aortic valve disease and to be a suitable candidate for TAVR, which was set up for 04/14/23.   Hospital Course     Consultants: none   Severe AS/AI: s/p successful TAVR with a 26 mm Edwards Sapien 3 Ultra Resilia THV via the TF approach on 04/14/23. Post operative echo completed but pending formal read. Groin sites are stable. ECG with sinus and no high grade heart block. Resumed on home  baby Asprin 81mg  daily. Walked with cardiac rehab with no issues. Plan for discharge home today with close follow up in the outpatient setting.   Acute on chronic HFrEF: as evidenced by an elevated LVEDP 25 at the time of TAVR. He was treated with IV Lasix 40mg  x1 and resumed on home Lasix 40mg  daily.   DMT2: treated with SSI while admitted. Resume home meds at discharge. Okay to resume Metformin after 48 hours after contrast dye exposure (9/12 PM)   CAD s/p CABG: pre TAVR cath showed Severe triple vessel CAD s/p 5V CABG with 5/5 patent bypass grafts. Continue medical therapy.  HTN: BP well controlled. Resumed on home meds.   Lesion of colon: pre TAVR CT showed a "calcified exophytic lesion of the descending colon measuring 2.2 cm, possibly sequela of prior fat infarction although neoplasm cannot be excluded. Recommend comparison with  more remote prior exams, if available, to ensure long-term stability. Alternatively, recommend follow-up CT of the abdomen and pelvis in 3 months to ensure stability." Will discuss in the outpatient setting.   _____________  Discharge Vitals Blood pressure (!) 140/62, pulse 89, temperature 98.2 F (36.8 C), temperature source Tympanic, resp. rate (!) 21, height 5\' 10"  (1.778 m), weight 97.9 kg, SpO2 96%.  Filed Weights   04/14/23 0749 04/15/23 0346  Weight: 99.8 kg 97.9 kg    GEN: Well nourished, well developed, in no acute distress HEENT: normal Neck: no JVD or masses Cardiac: RRR; no murmurs, rubs, or gallops,no edema  Respiratory:  clear to auscultation bilaterally, normal work of breathing GI: soft, nontender, nondistended, + BS MS: no deformity or atrophy Skin: warm and dry, no rash Neuro:  Alert and Oriented x 3, Strength and sensation are intact Psych: euthymic mood, full affect   Labs & Radiologic Studies    CBC Recent Labs    04/14/23 1140 04/15/23 0423  WBC  --  9.7  HGB 13.9 13.3  HCT 41.0 41.8  MCV  --  91.5  PLT  --  196   Basic Metabolic Panel Recent Labs    40/98/11 1140 04/15/23 0423  NA 139 135  K 4.4 4.1  CL 105 105  CO2  --  21*  GLUCOSE 154* 112*  BUN 18 17  CREATININE 1.00 1.16  CALCIUM  --  8.5*  MG  --  2.2   Liver Function Tests No results for input(s): "AST", "ALT", "ALKPHOS", "BILITOT", "PROT", "ALBUMIN" in the last 72 hours. No results for input(s): "LIPASE", "AMYLASE" in the last 72 hours. Cardiac Enzymes No results for input(s): "CKTOTAL", "CKMB", "CKMBINDEX", "TROPONINI" in the last 72 hours. BNP Invalid input(s): "POCBNP" D-Dimer No results for input(s): "DDIMER" in the last 72 hours. Hemoglobin A1C No results for input(s): "HGBA1C" in the last 72 hours. Fasting Lipid Panel No results for input(s): "CHOL", "HDL", "LDLCALC", "TRIG", "CHOLHDL", "LDLDIRECT" in the last 72 hours. Thyroid Function Tests No results for  input(s): "TSH", "T4TOTAL", "T3FREE", "THYROIDAB" in the last 72 hours.  Invalid input(s): "FREET3" _____________  ECHOCARDIOGRAM LIMITED  Result Date: 04/14/2023    ECHOCARDIOGRAM LIMITED REPORT   Patient Name:   Kenneth Randall [facility] Date of Exam: 04/14/2023 Medical Rec #:  [id number]      Height:       70.0 in Accession #:    [id number]     Weight:       220.0 lb Date of Birth:  [birth year]     [facility]   MULTIDISCIPLINARY HEART VALVE TEAM  Discharge Summary    Patient ID: Kenneth Randall MRN: [id number]; DOB: 03/22/1946  Admit date: 04/14/2023 Discharge date: 04/15/2023  Primary Care Provider: Kathleene Hazel, MD  Primary Cardiologist: Dina Rich, MD / Dr. Clifton Jovane & Dr. [name] (TAVR)  Discharge Diagnoses    Principal Problem:   S/P TAVR (transcatheter aortic valve replacement) Active Problems:   Ischemic cardiomyopathy   Acute on chronic HFrEF (heart failure with reduced ejection fraction) (HCC)   Essential (primary) hypertension   Dyslipidemia   Diabetes (HCC)   Psoriatic arthritis (HCC)   CAD (coronary artery disease)   Allergies No Known Allergies  Diagnostic Studies/Procedures    TAVR OPERATIVE NOTE     Date of Procedure:                04/14/2023   Preoperative Diagnosis:Severe Aortic Stenosis and moderate to severe aortic insufficiency   Postoperative Diagnosis:    Same    Procedure:        Transcatheter Aortic Valve Replacement - Percutaneous Right Transfemoral Approach             Edwards Sapien 3 Ultra Resilia THV (size 29 mm, model # 9755RSL, serial # [id number])              Co-Surgeons:                        [name], MD and Verne Carrow, MD     Anesthesiologist:                  Jolyne Loa, MD   Echocardiographer:              Lacretia Nicks. O'Neal, MD   Pre-operative Echo Findings: Severe aortic stenosis Moderate left ventricular systolic dysfunction   Post-operative Echo Findings: No paravalvular leak Unchanged moderate left ventricular systolic dysfunction  _____________    Echo 04/15/23: completed but pending formal read at the time of discharge   History of Present Illness     Kenneth Randall is a 77 y.o. 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   He was admitted to [facility] in 2021 with acute CHF and EF down to 20-25%. He was started on GDMT. His EF increased to 40 to 45% but he developed hyperkalemia and Entresto/spiro had to be discontinued. Repeat echo 02/2023 showed a drop in his ejection fraction to 30 to 35% with grade 2 diastolic dysfunction as well as severe LFLG AS and mod-severe AI. He was referred to Dr. Clifton Conrado for [facility] 03/30/23 which showed severe triple vessel CAD s/p 5V CABG with 5/5 patent bypass grafts as well as elevated right heart pressures. Lasix was increased from 20mg  to 40mg  daily.   The patient was evaluated by the multidisciplinary valve team and felt to have severe, symptomatic aortic valve disease and to be a suitable candidate for TAVR, which was set up for 04/14/23.   Hospital Course     Consultants: none   Severe AS/AI: s/p successful TAVR with a 26 mm Edwards Sapien 3 Ultra Resilia THV via the TF approach on 04/14/23. Post operative echo completed but pending formal read. Groin sites are stable. ECG with sinus and no high grade heart block. Resumed on home  baby Asprin 81mg  daily. Walked with cardiac rehab with no issues. Plan for discharge home today with close follow up in the outpatient setting.   Acute on chronic HFrEF: as evidenced by an elevated LVEDP 25 at the time of TAVR. He was treated with IV Lasix 40mg  x1 and resumed on home Lasix 40mg  daily.   DMT2: treated with SSI while admitted. Resume home meds at discharge. Okay to resume Metformin after 48 hours after contrast dye exposure (9/12 PM)   CAD s/p CABG: pre TAVR cath showed Severe triple vessel CAD s/p 5V CABG with 5/5 patent bypass grafts. Continue medical therapy.  HTN: BP well controlled. Resumed on home meds.   Lesion of colon: pre TAVR CT showed a "calcified exophytic lesion of the descending colon measuring 2.2 cm, possibly sequela of prior fat infarction although neoplasm cannot be excluded. Recommend comparison with  more remote prior exams, if available, to ensure long-term stability. Alternatively, recommend follow-up CT of the abdomen and pelvis in 3 months to ensure stability." Will discuss in the outpatient setting.   _____________  Discharge Vitals Blood pressure (!) 140/62, pulse 89, temperature 98.2 F (36.8 C), temperature source Tympanic, resp. rate (!) 21, height 5\' 10"  (1.778 m), weight 97.9 kg, SpO2 96%.  Filed Weights   04/14/23 0749 04/15/23 0346  Weight: 99.8 kg 97.9 kg    GEN: Well nourished, well developed, in no acute distress HEENT: normal Neck: no JVD or masses Cardiac: RRR; no murmurs, rubs, or gallops,no edema  Respiratory:  clear to auscultation bilaterally, normal work of breathing GI: soft, nontender, nondistended, + BS MS: no deformity or atrophy Skin: warm and dry, no rash Neuro:  Alert and Oriented x 3, Strength and sensation are intact Psych: euthymic mood, full affect   Labs & Radiologic Studies    CBC Recent Labs    04/14/23 1140 04/15/23 0423  WBC  --  9.7  HGB 13.9 13.3  HCT 41.0 41.8  MCV  --  91.5  PLT  --  196   Basic Metabolic Panel Recent Labs    40/98/11 1140 04/15/23 0423  NA 139 135  K 4.4 4.1  CL 105 105  CO2  --  21*  GLUCOSE 154* 112*  BUN 18 17  CREATININE 1.00 1.16  CALCIUM  --  8.5*  MG  --  2.2   Liver Function Tests No results for input(s): "AST", "ALT", "ALKPHOS", "BILITOT", "PROT", "ALBUMIN" in the last 72 hours. No results for input(s): "LIPASE", "AMYLASE" in the last 72 hours. Cardiac Enzymes No results for input(s): "CKTOTAL", "CKMB", "CKMBINDEX", "TROPONINI" in the last 72 hours. BNP Invalid input(s): "POCBNP" D-Dimer No results for input(s): "DDIMER" in the last 72 hours. Hemoglobin A1C No results for input(s): "HGBA1C" in the last 72 hours. Fasting Lipid Panel No results for input(s): "CHOL", "HDL", "LDLCALC", "TRIG", "CHOLHDL", "LDLDIRECT" in the last 72 hours. Thyroid Function Tests No results for  input(s): "TSH", "T4TOTAL", "T3FREE", "THYROIDAB" in the last 72 hours.  Invalid input(s): "FREET3" _____________  ECHOCARDIOGRAM LIMITED  Result Date: 04/14/2023    ECHOCARDIOGRAM LIMITED REPORT   Patient Name:   Kenneth Randall [facility] Date of Exam: 04/14/2023 Medical Rec #:  [id number]      Height:       70.0 in Accession #:    [id number]     Weight:       220.0 lb Date of Birth:  [birth year]     BSA:  baby Asprin 81mg  daily. Walked with cardiac rehab with no issues. Plan for discharge home today with close follow up in the outpatient setting.   Acute on chronic HFrEF: as evidenced by an elevated LVEDP 25 at the time of TAVR. He was treated with IV Lasix 40mg  x1 and resumed on home Lasix 40mg  daily.   DMT2: treated with SSI while admitted. Resume home meds at discharge. Okay to resume Metformin after 48 hours after contrast dye exposure (9/12 PM)   CAD s/p CABG: pre TAVR cath showed Severe triple vessel CAD s/p 5V CABG with 5/5 patent bypass grafts. Continue medical therapy.  HTN: BP well controlled. Resumed on home meds.   Lesion of colon: pre TAVR CT showed a "calcified exophytic lesion of the descending colon measuring 2.2 cm, possibly sequela of prior fat infarction although neoplasm cannot be excluded. Recommend comparison with  more remote prior exams, if available, to ensure long-term stability. Alternatively, recommend follow-up CT of the abdomen and pelvis in 3 months to ensure stability." Will discuss in the outpatient setting.   _____________  Discharge Vitals Blood pressure (!) 140/62, pulse 89, temperature 98.2 F (36.8 C), temperature source Tympanic, resp. rate (!) 21, height 5\' 10"  (1.778 m), weight 97.9 kg, SpO2 96%.  Filed Weights   04/14/23 0749 04/15/23 0346  Weight: 99.8 kg 97.9 kg    GEN: Well nourished, well developed, in no acute distress HEENT: normal Neck: no JVD or masses Cardiac: RRR; no murmurs, rubs, or gallops,no edema  Respiratory:  clear to auscultation bilaterally, normal work of breathing GI: soft, nontender, nondistended, + BS MS: no deformity or atrophy Skin: warm and dry, no rash Neuro:  Alert and Oriented x 3, Strength and sensation are intact Psych: euthymic mood, full affect   Labs & Radiologic Studies    CBC Recent Labs    04/14/23 1140 04/15/23 0423  WBC  --  9.7  HGB 13.9 13.3  HCT 41.0 41.8  MCV  --  91.5  PLT  --  196   Basic Metabolic Panel Recent Labs    40/98/11 1140 04/15/23 0423  NA 139 135  K 4.4 4.1  CL 105 105  CO2  --  21*  GLUCOSE 154* 112*  BUN 18 17  CREATININE 1.00 1.16  CALCIUM  --  8.5*  MG  --  2.2   Liver Function Tests No results for input(s): "AST", "ALT", "ALKPHOS", "BILITOT", "PROT", "ALBUMIN" in the last 72 hours. No results for input(s): "LIPASE", "AMYLASE" in the last 72 hours. Cardiac Enzymes No results for input(s): "CKTOTAL", "CKMB", "CKMBINDEX", "TROPONINI" in the last 72 hours. BNP Invalid input(s): "POCBNP" D-Dimer No results for input(s): "DDIMER" in the last 72 hours. Hemoglobin A1C No results for input(s): "HGBA1C" in the last 72 hours. Fasting Lipid Panel No results for input(s): "CHOL", "HDL", "LDLCALC", "TRIG", "CHOLHDL", "LDLDIRECT" in the last 72 hours. Thyroid Function Tests No results for  input(s): "TSH", "T4TOTAL", "T3FREE", "THYROIDAB" in the last 72 hours.  Invalid input(s): "FREET3" _____________  ECHOCARDIOGRAM LIMITED  Result Date: 04/14/2023    ECHOCARDIOGRAM LIMITED REPORT   Patient Name:   Kenneth Randall [facility] Date of Exam: 04/14/2023 Medical Rec #:  [id number]      Height:       70.0 in Accession #:    [id number]     Weight:       220.0 lb Date of Birth:  [birth year]     BSA:

## 2023-04-14 NOTE — CV Procedure (Signed)
[facility]  TAVR OPERATIVE NOTE   Date of Procedure:  04/14/2023  Preoperative Diagnosis: Severe Aortic Stenosis   Postoperative Diagnosis: Same   Procedure:   Transcatheter Aortic Valve Replacement - Transfemoral Approach  Edwards Sapien 3 THV (size 26 mm, model #[id number], serial # [id number] )   Co-Surgeons:  Verne Carrow, MD and Evelene Croon , MD   Anesthesiologist:  Renold Don  Echocardiographer:  O'Neal  Pre-operative Echo Findings: Severe aortic stenosis Moderate left ventricular systolic dysfunction  Post-operative Echo Findings: No paravalvular leak Moderate left ventricular systolic dysfunction  BRIEF CLINICAL NOTE AND INDICATIONS FOR SURGERY  77 yo male with history of HTN, HLD, CAD s/p 5V CABG in 1999, chronic systolic CHF, ischemic cardiomyopathy and severe aortic stenosis. Echo July 2024 with LVEF 30-35% with severe low flow/low gradient aortic stenosis (mean gradient 20 mmHg, AVA 0.97 cm2, DI 0.25) and moderate to severe AI. Drop in EF noted from echo last year. Cardiac cath with 5/5 patent bypass grafts. Cardiac CT with anatomy suitable for 26 mm Edwards Sapien 3 Ultra THV with right femoral access. He has ongoing fatigue but no dyspnea or chest pain.   During the course of the patient's preoperative work up they have been evaluated comprehensively by a multidisciplinary team of specialists coordinated through the Multidisciplinary Heart Valve Clinic in the [facility].  They have been demonstrated to suffer from symptomatic severe aortic stenosis as noted above. The patient has been counseled extensively as to the relative risks and benefits of all options for the treatment of severe aortic stenosis including long term medical therapy, conventional surgery for aortic valve replacement, and transcatheter aortic valve replacement.  The patient has been independently evaluated by Dr. [name] with CT surgery and they are felt  to be at high risk for conventional surgical aortic valve replacement. The surgeon indicated the patient would be a poor candidate for conventional surgery. Based upon review of all of the patient's preoperative diagnostic tests they are felt to be candidate for transcatheter aortic valve replacement using the transfemoral approach as an alternative to high risk conventional surgery.    Following the decision to proceed with transcatheter aortic valve replacement, a discussion has been held regarding what types of management strategies would be attempted intraoperatively in the event of life-threatening complications, including whether or not the patient would be considered a candidate for the use of cardiopulmonary bypass and/or conversion to open sternotomy for attempted surgical intervention.  The patient has been advised of a variety of complications that might develop peculiar to this approach including but not limited to risks of death, stroke, paravalvular leak, aortic dissection or other major vascular complications, aortic annulus rupture, device embolization, cardiac rupture or perforation, acute myocardial infarction, arrhythmia, heart block or bradycardia requiring permanent pacemaker placement, congestive heart failure, respiratory failure, renal failure, pneumonia, infection, other late complications related to structural valve deterioration or migration, or other complications that might ultimately cause a temporary or permanent loss of functional independence or other long term morbidity.  The patient provides full informed consent for the procedure as described and all questions were answered preoperatively.    DETAILS OF THE OPERATIVE PROCEDURE  PREPARATION:   The patient is brought to the operating room on the above mentioned date and central monitoring was established by the anesthesia team including placement of a radial arterial line. The patient is placed in the supine position on the  operating table.  Intravenous antibiotics are administered. Conscious sedation  is used.   Baseline transthoracic echocardiogram was performed. The patient's chest, abdomen, both groins, and both lower extremities are prepared and draped in a sterile manner. A time out procedure is performed.   PERIPHERAL ACCESS:   Using the modified Seldinger technique, femoral arterial and venous access were obtained with placement of a 6 Fr sheath in the artery and a 7 Fr sheath in the vein on the left side using u/s guidance.  A pigtail diagnostic catheter was passed through the femoral arterial sheath under fluoroscopic guidance into the aortic root.  A temporary transvenous pacemaker catheter was passed through the femoral venous sheath under fluoroscopic guidance into the right ventricle.  The pacemaker was tested to ensure stable lead placement and pacemaker capture. Aortic root angiography was performed in order to determine the optimal angiographic angle for valve deployment.  TRANSFEMORAL ACCESS:  A micropuncture kit was used to gain access to the right femoral artery using u/s guidance. Position confirmed with angiography. Pre-closure with double ProGlide closure devices. The patient was heparinized systemically and ACT verified > 250 seconds.    A 14 Fr transfemoral E-sheath was introduced into the right femoral artery after progressively dilating over an Amplatz superstiff wire. An AL-2 catheter was used to direct a J wire exchange length wire across the native aortic valve into the left ventricle. This was exchanged out for a pigtail catheter and position was confirmed in the LV apex. Simultaneous LV and Ao pressures were recorded.  The pigtail catheter was then exchanged for a Safari wire in the LV apex.   TRANSCATHETER HEART VALVE DEPLOYMENT:  An Edwards Sapien 3 THV (size 26 mm) was prepared and crimped per manufacturer's guidelines, and the proper orientation of the valve is confirmed on the [employer] delivery system. The valve was advanced through the introducer sheath using normal technique until in an appropriate position in the abdominal aorta beyond the sheath tip. The balloon was then retracted and using the fine-tuning wheel was centered on the valve. The valve was then advanced across the aortic arch using appropriate flexion of the catheter. The valve was carefully positioned across the aortic valve annulus. The Commander catheter was retracted using normal technique. Once final position of the valve has been confirmed by angiographic assessment, the valve is deployed while temporarily holding ventilation and during rapid ventricular pacing to maintain systolic blood pressure < 50 mmHg and pulse pressure < 10 mmHg. The balloon inflation is held for >3 seconds after reaching full deployment volume. Once the balloon has fully deflated the balloon is retracted into the ascending aorta and valve function is assessed using TTE. There is felt to be no paravalvular leak and no central aortic insufficiency.  The patient's hemodynamic recovery following valve deployment is good.  The deployment balloon and guidewire are both removed. Echo demostrated acceptable post-procedural gradients, stable mitral valve function, and no AI.   PROCEDURE COMPLETION:  The sheath was then removed and closure devices were completed. Protamine was administered once femoral arterial repair was complete. The temporary pacemaker, pigtail catheters and femoral sheaths were removed with a Mynx closure device placed in the artery and manual pressure used for venous hemostasis.    The patient tolerated the procedure well and is transported to the surgical intensive care in stable condition. There were no immediate intraoperative complications. All sponge instrument and needle counts are verified correct at completion of the operation.   No blood products were administered during the operation.  The patient received a  total of 30 mL of intravenous contrast during the procedure.  LVEDP: 25 mmHg  Verne Carrow MD, [facility] 04/14/2023 11:49 AM

## 2023-04-15 ENCOUNTER — Encounter (HOSPITAL_COMMUNITY): Payer: Self-pay | Admitting: Cardiovascular Disease

## 2023-04-15 ENCOUNTER — Inpatient Hospital Stay (HOSPITAL_COMMUNITY): Payer: PPO

## 2023-04-15 DIAGNOSIS — I350 Nonrheumatic aortic (valve) stenosis: Secondary | ICD-10-CM | POA: Diagnosis not present

## 2023-04-15 DIAGNOSIS — Z952 Presence of prosthetic heart valve: Secondary | ICD-10-CM

## 2023-04-15 DIAGNOSIS — Z006 Encounter for examination for normal comparison and control in clinical research program: Secondary | ICD-10-CM | POA: Diagnosis not present

## 2023-04-15 DIAGNOSIS — I4719 Other supraventricular tachycardia: Secondary | ICD-10-CM | POA: Diagnosis not present

## 2023-04-15 DIAGNOSIS — I5023 Acute on chronic systolic (congestive) heart failure: Secondary | ICD-10-CM | POA: Diagnosis not present

## 2023-04-15 DIAGNOSIS — I352 Nonrheumatic aortic (valve) stenosis with insufficiency: Secondary | ICD-10-CM | POA: Diagnosis not present

## 2023-04-15 DIAGNOSIS — Z954 Presence of other heart-valve replacement: Secondary | ICD-10-CM

## 2023-04-15 LAB — CBC
HCT: 41.8 % (ref 39.0–52.0)
Hemoglobin: 13.3 g/dL (ref 13.0–17.0)
MCH: 29.1 pg (ref 26.0–34.0)
MCHC: 31.8 g/dL (ref 30.0–36.0)
MCV: 91.5 fL (ref 80.0–100.0)
Platelets: 196 10*3/uL (ref 150–400)
RBC: 4.57 MIL/uL (ref 4.22–5.81)
RDW: 13.2 % (ref 11.5–15.5)
WBC: 9.7 10*3/uL (ref 4.0–10.5)
nRBC: 0 % (ref 0.0–0.2)

## 2023-04-15 LAB — BASIC METABOLIC PANEL
Anion gap: 9 (ref 5–15)
BUN: 17 mg/dL (ref 8–23)
CO2: 21 mmol/L — ABNORMAL LOW (ref 22–32)
Calcium: 8.5 mg/dL — ABNORMAL LOW (ref 8.9–10.3)
Chloride: 105 mmol/L (ref 98–111)
Creatinine, Ser: 1.16 mg/dL (ref 0.61–1.24)
GFR, Estimated: >60 mL/min (ref >60)
Glucose, Bld: 112 mg/dL — ABNORMAL HIGH (ref 70–99)
Potassium: 4.1 mmol/L (ref 3.5–5.1)
Sodium: 135 mmol/L (ref 135–145)

## 2023-04-15 LAB — MAGNESIUM: Magnesium: 2.2 mg/dL (ref 1.7–2.4)

## 2023-04-15 LAB — GLUCOSE, CAPILLARY: Glucose-Capillary: 124 mg/dL — ABNORMAL HIGH (ref 70–99)

## 2023-04-15 MED ORDER — PERFLUTREN LIPID MICROSPHERE
1.0000 mL | INTRAVENOUS | Status: DC | PRN
  Administered 2023-04-15: 4 mL via INTRAVENOUS

## 2023-04-15 MED ORDER — FUROSEMIDE 40 MG PO TABS: 40.0000 mg | ORAL_TABLET | Freq: Every day | ORAL | 1 refills | Status: DC

## 2023-04-15 MED FILL — Lidocaine HCl Local Preservative Free (PF) Inj 1%: INTRAMUSCULAR | Qty: 30 | Status: AC

## 2023-04-15 NOTE — Plan of Care (Signed)
Poc progressing.  

## 2023-04-15 NOTE — Progress Notes (Addendum)
Pt/family given discharge instructions, medication lists, follow up appointments, and when to call the doctor.  Pt/family verbalizes understanding. PIVs removed and telemetry removed, CCMD called. Pt given signs and symptoms of infection. Thomas Hoff, RN

## 2023-04-15 NOTE — Progress Notes (Signed)
Echocardiogram 2D Echocardiogram has been performed.  Warren Lacy Carlin Attridge RDCS 04/15/2023, 10:06 AM

## 2023-04-15 NOTE — Progress Notes (Signed)
CARDIAC REHAB PHASE I   PRE:  Rate/Rhythm: 78 SR   BP:  Sitting: 140/62      SaO2: 98 RA  MODE:  Ambulation: 420 ft   POST:  Rate/Rhythm: 85 SR   BP:  Sitting: 138/71      SaO2: 95 RA  Pt ambulated, using cane, in hallway. Moving independently at slow steady pace. Returned to bed with call bell and bedside table in reach. Post TAVR education including site care, restrictions, exercise guidelines, heart healthy diet and CRP2 reviewed. All questions and concerns addressed. Will refer to AP for CRP2. Plan for home today.   [id number]  Woodroe Chen, RN BSN 04/15/2023 10:19 AM

## 2023-04-15 NOTE — Progress Notes (Signed)
1 Day Post-Op Procedure(s) (LRB): Transcatheter Aortic Valve Replacement, Transfemoral (N/A) INTRAOPERATIVE TRANSTHORACIC ECHOCARDIOGRAM (N/A) Subjective:  No complaints. Feels well. He walked last night and this morning with no shortness of breath or chest pain. Getting ready to have echo done.  Objective: Vital signs in last 24 hours: Temp:  [97.4 F (36.3 C)-98.2 F (36.8 C)] 98.2 F (36.8 C) (09/11 0737) Pulse Rate:  [64-91] 89 (09/11 0737) Cardiac Rhythm: Normal sinus rhythm (09/11 0803) Resp:  [11-22] 21 (09/11 0737) BP: (94-154)/(53-89) 140/62 (09/11 0737) SpO2:  [90 %-100 %] 96 % (09/11 0346) Weight:  [97.9 kg] 97.9 kg (09/11 0346)  Hemodynamic parameters for last 24 hours:    Intake/Output from previous day: 09/10 0701 - 09/11 0700 In: 300 [IV Piggyback:300] Out: 255 [Urine:250; Blood:5] Intake/Output this shift: No intake/output data recorded.  General appearance: alert and cooperative Neurologic: intact Heart: regular rate and rhythm, S1, S2 normal, no murmur Lungs: clear to auscultation bilaterally Abdomen: soft, non-tender Extremities: extremities normal, pedal pulses palpable. Wound: groin sites soft and dry, no hematoma  Lab Results: Recent Labs    04/14/23 1140 04/15/23 0423  WBC  --  9.7  HGB 13.9 13.3  HCT 41.0 41.8  PLT  --  196   BMET:  Recent Labs    04/14/23 1140 04/15/23 0423  NA 139 135  K 4.4 4.1  CL 105 105  CO2  --  21*  GLUCOSE 154* 112*  BUN 18 17  CREATININE 1.00 1.16  CALCIUM  --  8.5*    PT/INR: No results for input(s): "LABPROT", "INR" in the last 72 hours. ABG    Component Value Date/Time   PHART 7.401 03/30/2023 0808   HCO3 18.6 (L) 03/30/2023 0808   TCO2 21 (L) 04/14/2023 1140   ACIDBASEDEF 5.0 (H) 03/30/2023 0808   O2SAT 94 03/30/2023 0808   CBG (last 3)  Recent Labs    04/14/23 1605 04/14/23 2124 04/15/23 0623  GLUCAP 99 113* 124*   ECG: sinus, occ PVC's, no heart block.  Assessment/Plan: S/P  Procedure(s) (LRB): Transcatheter Aortic Valve Replacement, Transfemoral (N/A) INTRAOPERATIVE TRANSTHORACIC ECHOCARDIOGRAM (N/A)  POD 1 Hemodynamically stable in sinus rhythm.  Labs ok.  2D echo this am and then home afterward if echo ok.   LOS: 1 day    [name] 04/15/2023

## 2023-04-16 ENCOUNTER — Telehealth: Payer: Self-pay | Admitting: Physician Assistant

## 2023-04-16 NOTE — Telephone Encounter (Signed)
  [facility]   MULTIDISCIPLINARY HEART VALVE TEAM  Left VM to call back.   Cline Crock PA-C  [facility]

## 2023-04-16 NOTE — Telephone Encounter (Signed)
  [facility]   MULTIDISCIPLINARY HEART VALVE TEAM   Patient contacted regarding discharge from [facility] on 04/15/23  Patient understands to follow up with a structural heart APP on 9/18 at [address].  Patient understands discharge instructions? yes Patient understands medications and regimen? yes Patient understands to bring all medications to this visit? yes  Cline Crock PA-C  [facility]

## 2023-04-17 LAB — ECHOCARDIOGRAM COMPLETE
AR max vel: 2.2 cm2
AV Area VTI: 2.61 cm2
AV Area mean vel: 2.25 cm2
AV Mean grad: 8 mmHg
AV Peak grad: 14.4 mmHg
Ao pk vel: 1.9 m/s
Height: 70 in
S' Lateral: 5 cm
Weight: 3452.8 [oz_av]

## 2023-04-22 ENCOUNTER — Ambulatory Visit: Payer: PPO | Attending: Cardiology | Admitting: Physician Assistant

## 2023-04-22 VITALS — BP 126/70 | HR 74 | Ht 70.0 in | Wt 216.6 lb

## 2023-04-22 DIAGNOSIS — K639 Disease of intestine, unspecified: Secondary | ICD-10-CM

## 2023-04-22 DIAGNOSIS — I5022 Chronic systolic (congestive) heart failure: Secondary | ICD-10-CM | POA: Diagnosis not present

## 2023-04-22 DIAGNOSIS — Z952 Presence of prosthetic heart valve: Secondary | ICD-10-CM

## 2023-04-22 DIAGNOSIS — I10 Essential (primary) hypertension: Secondary | ICD-10-CM | POA: Diagnosis not present

## 2023-04-22 DIAGNOSIS — I2510 Atherosclerotic heart disease of native coronary artery without angina pectoris: Secondary | ICD-10-CM | POA: Diagnosis not present

## 2023-04-22 MED ORDER — AMOXICILLIN 500 MG PO TABS: 2000.0000 mg | ORAL_TABLET | ORAL | 12 refills | Status: AC

## 2023-04-22 NOTE — Patient Instructions (Signed)
Medication Instructions:  Start Start Amoxicillin 500 mg, take 4 tablets by mouth 1 hour prior to dental procedures and cleanings.    *If you need a refill on your cardiac medications before your next appointment, please call your pharmacy*   Lab Work: None ordered   If you have labs (blood work) drawn today and your tests are completely normal, you will receive your results only by: MyChart Message (if you have MyChart) OR A paper copy in the mail If you have any lab test that is abnormal or we need to change your treatment, we will call you to review the results.   Testing/Procedures: None ordered    Follow-Up: Follow up as scheduled   Other Instructions

## 2023-04-23 ENCOUNTER — Ambulatory Visit (HOSPITAL_COMMUNITY)
Admission: RE | Admit: 2023-04-23 | Discharge: 2023-04-23 | Disposition: A | Discharge status: Home / Self Care | Payer: PPO | Source: Ambulatory Visit | Attending: Cardiology | Admitting: Cardiology

## 2023-04-23 ENCOUNTER — Encounter (HOSPITAL_COMMUNITY): Payer: Self-pay

## 2023-04-23 DIAGNOSIS — Z952 Presence of prosthetic heart valve: Secondary | ICD-10-CM | POA: Insufficient documentation

## 2023-04-28 ENCOUNTER — Encounter (HOSPITAL_COMMUNITY): Payer: PPO

## 2023-05-01 ENCOUNTER — Other Ambulatory Visit: Payer: Self-pay | Admitting: Cardiology

## 2023-05-13 ENCOUNTER — Ambulatory Visit (HOSPITAL_COMMUNITY): Payer: PPO | Attending: Cardiovascular Disease

## 2023-05-13 ENCOUNTER — Other Ambulatory Visit (HOSPITAL_COMMUNITY): Payer: PPO

## 2023-05-13 ENCOUNTER — Ambulatory Visit: Payer: PPO | Admitting: Physician Assistant

## 2023-05-13 VITALS — BP 110/74 | HR 83 | Ht 70.0 in | Wt 213.0 lb

## 2023-05-13 DIAGNOSIS — I2510 Atherosclerotic heart disease of native coronary artery without angina pectoris: Secondary | ICD-10-CM

## 2023-05-13 DIAGNOSIS — Z952 Presence of prosthetic heart valve: Secondary | ICD-10-CM

## 2023-05-13 DIAGNOSIS — I10 Essential (primary) hypertension: Secondary | ICD-10-CM

## 2023-05-13 DIAGNOSIS — K639 Disease of intestine, unspecified: Secondary | ICD-10-CM

## 2023-05-13 DIAGNOSIS — I5022 Chronic systolic (congestive) heart failure: Secondary | ICD-10-CM | POA: Insufficient documentation

## 2023-05-13 MED ORDER — PERFLUTREN LIPID MICROSPHERE
1.0000 mL | INTRAVENOUS | Status: AC | PRN
Start: 2023-05-13 — End: 2023-05-13
  Administered 2023-05-13: 3 mL via INTRAVENOUS

## 2023-05-13 NOTE — Patient Instructions (Signed)
Medication Instructions:  Your physician recommends that you continue on your current medications as directed. Please refer to the Current Medication list given to you today.  *If you need a refill on your cardiac medications before your next appointment, please call your pharmacy*  Lab Work: None ordered If you have labs (blood work) drawn today and your tests are completely normal, you will receive your results only by: MyChart Message (if you have MyChart) OR A paper copy in the mail If you have any lab test that is abnormal or we need to change your treatment, we will call you to review the results.  Testing/Procedures: Your physician has requested that you have an echocardiogram on 04/13/24 at 1:45 PM. Echocardiography is a painless test that uses sound waves to create images of your heart. It provides your doctor with information about the size and shape of your heart and how well your heart's chambers and valves are working. This procedure takes approximately one hour. There are no restrictions for this procedure. Please do NOT wear cologne, perfume, aftershave, or lotions (deodorant is allowed). Please arrive 15 minutes prior to your appointment time.    Follow-Up: At [facility], you and your health needs are our priority.  As part of our continuing mission to provide you with exceptional heart care, we have created designated Provider Care Teams.  These Care Teams include your primary Cardiologist (physician) and Advanced Practice Providers (APPs -  Physician Assistants and Nurse Practitioners) who all work together to provide you with the care you need, when you need it.  Your next appointment:   04/13/24 at 2:45 PM  Provider:   Carlean Jews, PA-C

## 2023-05-13 NOTE — Progress Notes (Unsigned)
[facility]   MULTIDISCIPLINARY HEART VALVE CLINIC                                     Cardiology Office Note:    Date:  05/13/2023   ID:  Kenneth Randall, DOB 04-08-1946, MRN [id number]  PCP:  Kathleene Hazel, MD  [facility] Cardiologist:  Dina Rich, MD  [facility] Electrophysiologist:  None   Referring MD: Kathleene Hazel*   1 month s/p TAVR  History of Present Illness:    Kenneth Randall is a 77 y.o. 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    He was admitted to [facility] in 2021 with acute CHF and EF down to 20-25%. He was started on GDMT. His EF increased to 40 to 45% but he developed hyperkalemia and Entresto/spiro had to be discontinued. Repeat echo 02/2023 showed a drop in his ejection fraction to 30 to 35% with G2DD as well as severe LFLG AS and mod-severe AI. He was referred to Dr. [name] for [facility] 03/30/23 which showed severe triple vessel CAD s/p 5V CABG with 5/5 patent bypass grafts as well as elevated right heart pressures. Lasix was increased from 20mg  to 40mg  daily.    He was was evaluated by the multidisciplinary valve team and underwent TAVR with a 26 mm Edwards Sapien 3 Ultra Resilia THV via the TF approach on 04/14/23. Post operative echo showed EF 30-35%, normally functioning TAVR with a mean gradient of 8 mmHg and no PVL. Resumed on home baby Asprin 81mg  daily.   Today the patient presents to clinic for follow up. Has chronic mild LLE edema since bypass surgery. No CP or SOB. No orthopnea or PND. No dizziness or syncope. No blood in stool or urine. No palpitations. No complaints today.   Past Medical History:  Diagnosis Date   Aortic stenosis    CAD (coronary artery disease)    Cancer (HCC)    skin   Hypertension    Ischemic cardiomyopathy    Pre-diabetes     PSVT (paroxysmal supraventricular tachycardia) (HCC)    S/P TAVR (transcatheter aortic valve replacement) 04/14/2023   s/p TAVR with a 26 mm Edwards S3UR via the TF approach by Dr. Clifton [name] & Dr. [name]    Past Surgical History:  Procedure Laterality Date   INTRAOPERATIVE TRANSTHORACIC ECHOCARDIOGRAM N/A 04/14/2023   Procedure: INTRAOPERATIVE TRANSTHORACIC ECHOCARDIOGRAM;  Surgeon: Kathleene Hazel, MD;  Location: [facility] INVASIVE CV LAB;  Service: Open Heart Surgery;  Laterality: N/A;   RIGHT/LEFT HEART CATH AND CORONARY/GRAFT ANGIOGRAPHY N/A 02/08/2020   Procedure: RIGHT/LEFT HEART CATH AND CORONARY/GRAFT ANGIOGRAPHY;  Surgeon: Kathleene Hazel, MD;  Location: [facility] INVASIVE CV LAB;  Service: Cardiovascular;  Laterality: N/A;   RIGHT/LEFT HEART CATH AND CORONARY/GRAFT ANGIOGRAPHY N/A 03/30/2023   Procedure: RIGHT/LEFT HEART CATH AND CORONARY/GRAFT ANGIOGRAPHY;  Surgeon: Kathleene Hazel, MD;  Location: [facility] INVASIVE CV LAB;  Service: Cardiovascular;  Laterality: N/A;   SKIN CANCER EXCISION     TRANSCATHETER AORTIC VALVE REPLACEMENT, TRANSFEMORAL N/A 04/14/2023   Procedure: Transcatheter Aortic Valve Replacement, Transfemoral;  Surgeon: Kathleene Hazel, MD;  Location: [facility] INVASIVE CV LAB;  Service: Open Heart Surgery;  Laterality: N/A;    Current Medications: Current Meds  Medication Sig   acetaminophen (TYLENOL) 650 MG CR tablet Take 1,300 mg by mouth every 8 (eight) hours as needed for pain.   amoxicillin (AMOXIL) 500 MG tablet Take 4 tablets (2,000 mg total) by mouth as directed. 1 hour prior to dental work including cleanings   aspirin EC 81 MG tablet Take 1 tablet (81 mg total) by mouth daily. Swallow whole.   Coenzyme Q10 (COQ10) 100 MG CAPS Take 100 mg by mouth daily.   furosemide (LASIX) 40 MG tablet Take 1 tablet (40 mg total) by mouth daily.   glipiZIDE (GLUCOTROL XL) 5 MG 24 hr tablet Take 5 mg by mouth daily.   hydrALAZINE (APRESOLINE) 25 MG tablet TAKE 1 TABLET  BY MOUTH TWICE A DAY   isosorbide mononitrate (IMDUR) 30 MG 24 hr tablet TAKE 1/2 TABLET BY MOUTH DAILY   JARDIANCE 10 MG TABS tablet Take 10 mg by mouth daily.   levothyroxine (SYNTHROID) 50 MCG tablet Take 50 mcg by mouth daily.   metFORMIN (GLUCOPHAGE-XR) 500 MG 24 hr tablet Take 500 mg by mouth 2 (two) times daily.   metoprolol succinate (TOPROL-XL) 100 MG 24 hr tablet TAKE 1.5 TABLETS (150 MG TOTAL) BY MOUTH DAILY. TAKE WITH OR IMMEDIATELY FOLLOWING A MEAL.   rosuvastatin (CRESTOR) 20 MG tablet Take 1 tablet (20 mg total) by mouth daily.     ROS:   Please see the history of present illness.    All other systems reviewed and are negative.  EKGs   EKG:  EKG is NOT ordered today.    Recent Labs: 04/10/2023: ALT 20 04/15/2023: BUN 17; Creatinine, Ser 1.16; Hemoglobin 13.3; Magnesium 2.2; Platelets 196; Potassium 4.1; Sodium 135  Recent Lipid Panel No results found for: "CHOL", "TRIG", "HDL", "CHOLHDL", "VLDL", "LDLCALC", "LDLDIRECT"   Risk Assessment/Calculations:        {This patient may be at risk for Amyloid.  Click HERE to open Cardiac Amyloid Screening SmartSet to order screening OR Click HERE to defer testing for 1 year or permanently :1}    Physical Exam:    VS:  BP 110/74   Pulse 83   Ht 5\' 10"  (1.778 m)   Wt 213 lb (96.6 kg)   SpO2 96%   BMI 30.56 kg/m     Wt Readings from Last 3 Encounters:  05/13/23 213 lb (96.6 kg)  04/22/23 216 lb 9.6 oz (98.2 kg)  04/15/23 215 lb 12.8 oz (97.9 kg)     GEN: Well nourished, well developed in no acute distress NECK: No JVD; No carotid bruits CARDIAC: RRR, no murmurs, rubs, gallops RESPIRATORY:  Clear to auscultation without rales, wheezing or rhonchi  ABDOMEN: Soft, non-tender, non-distended EXTREMITIES:  No edema; No deformity   ASSESSMENT:    1. S/P TAVR (transcatheter aortic valve replacement)   2. Chronic systolic CHF (congestive heart failure), NYHA class 3 (HCC)   3. Coronary artery disease involving native  heart without angina pectoris, unspecified vessel or lesion type   4. Essential (primary) hypertension   5. Lesion of colon     PLAN:    In order of problems listed above:  Severe AS/AI s/p TAVR: echo today shows EF 35%***, normally functioning TAVR with a mean gradient of 5 mm hg and no PVL. He has NYHA class I symptoms. Continue baby Asprin 81mg  daily. SBE prophylaxis discussed; he has amoxicillin. I will see back in the office for 1 year follow up and echo.   Chronic HFrEF: EF 30-35%. *** Appears euvolemic today. Continue  Lasix 40mg  daily, Toprol XL150mg  daily, hydralazine/nitrate combo. Cannot take MRA/ARNI due to hyperkalemia.   CAD s/p CABG: pre TAVR cath showed severe triple vessel CAD s/p 5V CABG with 5/5 patent bypass grafts. Continue medical therapy with aspirin, statin and BB.   HTN: BP well controlled. No changes made.   Lesion of colon: pre TAVR CT showed a "calcified exophytic lesion of the descending colon measuring 2.2 cm, possibly sequela of prior fat infarction although neoplasm cannot be excluded. Recommend comparison with more remote prior exams, if available, to ensure long-term stability. Alternatively, recommend follow-up CT of the abdomen and pelvis in 3 months to ensure stability." ***  {The patient has an active order for outpatient cardiac rehabilitation.   Please indicate if the patient is ready to start. Do NOT delete this.  It will auto delete.  Refresh note, then sign.              Click here to document readiness and see contraindications.  :1}  Cardiac Rehabilitation Eligibility Assessment  The patient is ready to start cardiac rehabilitation from a cardiac standpoint.     Medication Adjustments/Labs and Tests Ordered: Current medicines are reviewed at length with the patient today.  Concerns regarding medicines are outlined above.  Orders Placed This Encounter  Procedures   ECHOCARDIOGRAM COMPLETE   No orders of the defined types were placed in  this encounter.   Patient Instructions  Medication Instructions:  Your physician recommends that you continue on your current medications as directed. Please refer to the Current Medication list given to you today.  *If you need a refill on your cardiac medications before your next appointment, please call your pharmacy*  Lab Work: None ordered If you have labs (blood work) drawn today and your tests are completely normal, you will receive your results only by: MyChart Message (if you have MyChart) OR A paper copy in the mail If you have any lab test that is abnormal or we need to change your treatment, we will call you to review the results.  Testing/Procedures: Your physician has requested that you have an echocardiogram on 04/13/24 at 1:45 PM. Echocardiography is a painless test that uses sound waves to create images of your heart. It provides your doctor with information about the size and shape of your heart and how well your heart's chambers and valves are working. This procedure takes approximately one hour. There are no restrictions for this procedure. Please do NOT wear cologne, perfume, aftershave, or lotions (deodorant is allowed). Please arrive 15 minutes prior to your appointment time.    Follow-Up: At [facility], you and your health needs are our priority.  As part of our continuing mission to provide you with exceptional heart care, we have created designated Provider Care Teams.  These Care Teams include your primary Cardiologist (physician) and Advanced Practice Providers (APPs -  Physician Assistants and Nurse Practitioners) who all work together to provide you with the care you need, when you need it.  Your next appointment:   04/13/24 at 2:45 PM  Provider:   Carlean Jews, PA-C       Signed, Cline Crock, PA-C  05/13/2023 2:51 PM    [facility]

## 2023-05-14 ENCOUNTER — Encounter: Payer: Self-pay | Admitting: Cardiology

## 2023-05-14 ENCOUNTER — Ambulatory Visit: Payer: PPO | Attending: Cardiology | Admitting: Cardiology

## 2023-05-14 VITALS — BP 142/72 | HR 94 | Ht 70.0 in | Wt 216.8 lb

## 2023-05-14 DIAGNOSIS — I350 Nonrheumatic aortic (valve) stenosis: Secondary | ICD-10-CM | POA: Diagnosis not present

## 2023-05-14 DIAGNOSIS — Z79899 Other long term (current) drug therapy: Secondary | ICD-10-CM

## 2023-05-14 DIAGNOSIS — I5032 Chronic diastolic (congestive) heart failure: Secondary | ICD-10-CM | POA: Diagnosis not present

## 2023-05-14 DIAGNOSIS — I2510 Atherosclerotic heart disease of native coronary artery without angina pectoris: Secondary | ICD-10-CM

## 2023-05-14 LAB — ECHOCARDIOGRAM COMPLETE
AR max vel: 2.23 cm2
AV Area VTI: 2.18 cm2
AV Area mean vel: 2.1 cm2
AV Mean grad: 5.5 mm[Hg]
AV Peak grad: 9.9 mm[Hg]
Ao pk vel: 1.57 m/s
Area-P 1/2: 4.49 cm2
S' Lateral: 4.6 cm

## 2023-05-14 MED ORDER — METOPROLOL SUCCINATE ER 200 MG PO TB24: 200.0000 mg | ORAL_TABLET | Freq: Every day | ORAL | 6 refills | Status: DC

## 2023-05-14 MED ORDER — LOSARTAN POTASSIUM 25 MG PO TABS: 12.5000 mg | ORAL_TABLET | Freq: Every day | ORAL | 6 refills | Status: DC

## 2023-05-14 NOTE — Progress Notes (Signed)
Clinical Summary Kenneth Randall is a 77 y.o.male seen today for follow up of the following medical problems.      1. Chronic systolic HF/ICM - from [facility] notes 12/2019 echo showed LVEF 20-25%, new diagnosis for patient - at that time had significant volume overload, succesfully diuresed and started on medical therapy.   02/2020 cath: occluded LAD, occluded LCX, occluded RCA. SVG-RPDA, SVG-D2/distal LAD patent, sequential graft to OM1 patent. Mean PA 20, PCWP 10, LVEDP 7, CI 2.5  - 03/2020 [facility]: consistent with prior scar, LVEF 30%  - 01/2021 echo LVEF 40-45%   -hyperkalemia on entresto alone, also with aldactone alone.    -02/2022 echo: LVEF 40-45%, apex akinetic  -02/2023 echo: LVEF 30-35%, grade II dd, mod to severe low flow low gradient AS mean grad 20, AVA VTI 0.97 DI 0.25, mod to severe AI -03/2023 RHC/LHC: LAD CTO, LCX CTO, RCA mid CTO. SVG-RPDA patent, SVG-d2/distal LAD patent, SVT-OM1/OM2 patent Mean PA 43 PCWP 30 CI2 - s/p TAVR 04/14/23  -05/2023 echo: LVEF 25-30%, normal AVR  - no SOB/DOE, no edema. Compliants with meds       2. PSVT - noted on prior zio patch done at [facility] - no recent palptiations       3. CAD - history of 5 vessel CABG in 1999 -02/2020 cath: occluded LAD, occluded LCX, occluded RCA. SVG-RPDA, SVG-D2/distal LAD patent, sequential graft to OM1 patent. Mean PA 20, PCWP 10, LVEDP 7, CI 2.5   -no chest pains.       4. HTN - he is compliant with meds      5. Aortic stenosis - 01/2021 echo mean grade 18, AVA VTI 1.02. Mild to moderate AS.   - 01/2022 echo: mod AS mean grad 20, AVA VTI  0.87, DI 0.31, SVI 25   7 /2024 echo: LVEF 30-35%, grade II dd, mod to severe low flow low gradient AS mean grad 20, AVA VTI 0.97 DI 0.25, mod to severe AI  04/14/23 had TAVR,  Edwards Sapien 3 Ultra Resilia THV size 29 mm  05/2023 echo: LVEF 25-30%, normal AVR   6. Hyperlipidemia -muscle aches on crestor 40mg , tolerating 20mg       7. Colon lesion -  TAVR CT showed a  "calcified exophytic lesion of the descending colon measuring 2.2 cm, possibly sequela of prior fat infarction although neoplasm cannot be excluded. Recommend comparison with more remote prior exams, if available, to ensure long-term stability. Alternatively, recommend follow-up CT of the abdomen and pelvis in 3 months to ensure stability  Past Medical History:  Diagnosis Date   Aortic stenosis    CAD (coronary artery disease)    Cancer (HCC)    skin   Hypertension    Ischemic cardiomyopathy    Pre-diabetes    PSVT (paroxysmal supraventricular tachycardia) (HCC)    S/P TAVR (transcatheter aortic valve replacement) 04/14/2023   s/p TAVR with a 26 mm Edwards S3UR via the TF approach by Dr. Clifton Daire & Dr. [name]     No Known Allergies   Current Outpatient Medications  Medication Sig Dispense Refill   acetaminophen (TYLENOL) 650 MG CR tablet Take 1,300 mg by mouth every 8 (eight) hours as needed for pain.     amoxicillin (AMOXIL) 500 MG tablet Take 4 tablets (2,000 mg total) by mouth as directed. 1 hour prior to dental work including cleanings 12 tablet 12   aspirin EC 81 MG tablet Take 1 tablet (81 mg total) by mouth daily.  Swallow whole.     Coenzyme Q10 (COQ10) 100 MG CAPS Take 100 mg by mouth daily.     furosemide (LASIX) 40 MG tablet Take 1 tablet (40 mg total) by mouth daily. 90 tablet 1   glipiZIDE (GLUCOTROL XL) 5 MG 24 hr tablet Take 5 mg by mouth daily.     hydrALAZINE (APRESOLINE) 25 MG tablet TAKE 1 TABLET BY MOUTH TWICE A DAY 180 tablet 1   isosorbide mononitrate (IMDUR) 30 MG 24 hr tablet TAKE 1/2 TABLET BY MOUTH DAILY 135 tablet 3   JARDIANCE 10 MG TABS tablet Take 10 mg by mouth daily.     levothyroxine (SYNTHROID) 50 MCG tablet Take 50 mcg by mouth daily.     metFORMIN (GLUCOPHAGE-XR) 500 MG 24 hr tablet Take 500 mg by mouth 2 (two) times daily.     metoprolol succinate (TOPROL-XL) 100 MG 24 hr tablet TAKE 1.5 TABLETS (150 MG TOTAL) BY MOUTH DAILY. TAKE WITH OR  IMMEDIATELY FOLLOWING A MEAL. 135 tablet 3   rosuvastatin (CRESTOR) 20 MG tablet Take 1 tablet (20 mg total) by mouth daily. 90 tablet 1   No current facility-administered medications for this visit.     Past Surgical History:  Procedure Laterality Date   INTRAOPERATIVE TRANSTHORACIC ECHOCARDIOGRAM N/A 04/14/2023   Procedure: INTRAOPERATIVE TRANSTHORACIC ECHOCARDIOGRAM;  Surgeon: Kathleene Hazel, MD;  Location: [facility] INVASIVE CV LAB;  Service: Open Heart Surgery;  Laterality: N/A;   RIGHT/LEFT HEART CATH AND CORONARY/GRAFT ANGIOGRAPHY N/A 02/08/2020   Procedure: RIGHT/LEFT HEART CATH AND CORONARY/GRAFT ANGIOGRAPHY;  Surgeon: Kathleene Hazel, MD;  Location: [facility] INVASIVE CV LAB;  Service: Cardiovascular;  Laterality: N/A;   RIGHT/LEFT HEART CATH AND CORONARY/GRAFT ANGIOGRAPHY N/A 03/30/2023   Procedure: RIGHT/LEFT HEART CATH AND CORONARY/GRAFT ANGIOGRAPHY;  Surgeon: Kathleene Hazel, MD;  Location: [facility] INVASIVE CV LAB;  Service: Cardiovascular;  Laterality: N/A;   SKIN CANCER EXCISION     TRANSCATHETER AORTIC VALVE REPLACEMENT, TRANSFEMORAL N/A 04/14/2023   Procedure: Transcatheter Aortic Valve Replacement, Transfemoral;  Surgeon: Kathleene Hazel, MD;  Location: [facility] INVASIVE CV LAB;  Service: Open Heart Surgery;  Laterality: N/A;     No Known Allergies    Family History  Problem Relation Age of Onset   Heart attack Mother 59   Rheum arthritis Mother    Diabetes Father    Heart disease Father        died after bypass surgery   Cancer Brother        jaw    Stroke Paternal Grandmother      Social History Kenneth Randall reports that he quit smoking about 33 years ago. His smoking use included cigarettes. He has never used smokeless tobacco. Kenneth Randall reports current alcohol use.   Review of Systems CONSTITUTIONAL: No weight loss, fever, chills, weakness or fatigue.  HEENT: Eyes: No visual loss, blurred vision, double vision or yellow sclerae.No hearing loss,  sneezing, congestion, runny nose or sore throat.  SKIN: No rash or itching.  CARDIOVASCULAR: per hpi RESPIRATORY: No shortness of breath, cough or sputum.  GASTROINTESTINAL: No anorexia, nausea, vomiting or diarrhea. No abdominal pain or blood.  GENITOURINARY: No burning on urination, no polyuria NEUROLOGICAL: No headache, dizziness, syncope, paralysis, ataxia, numbness or tingling in the extremities. No change in bowel or bladder control.  MUSCULOSKELETAL: No muscle, back pain, joint pain or stiffness.  LYMPHATICS: No enlarged nodes. No history of splenectomy.  PSYCHIATRIC: No history of depression or anxiety.  ENDOCRINOLOGIC: No reports of sweating, cold or heat  intolerance. No polyuria or polydipsia.  Marland Kitchen   Physical Examination Today's Vitals   05/14/23 0953 05/14/23 0957  BP: (!) 148/88 (!) 142/72  Pulse: 94   SpO2: 96%   Weight: 216 lb 12.8 oz (98.3 kg)   Height: 5\' 10"  (1.778 m)    Body mass index is 31.11 kg/m.  Gen: resting comfortably, no acute distress HEENT: no scleral icterus, pupils equal round and reactive, no palptable cervical adenopathy,  CV: RRR, no mrg, no jvd Resp: Clear to auscultation bilaterally GI: abdomen is soft, non-tender, non-distended, normal bowel sounds, no hepatosplenomegaly MSK: extremities are warm, no edema.  Skin: warm, no rash Neuro:  no focal deficits Psych: appropriate affect   Diagnostic Studies Echo: 12/2019 Summary 1. Technically difficult study due to body habitus. 2. Echo contrast utilized to enhance endocardial border definition. 3. The left ventricle is mildly to moderately dilated in size with upper normal wall thickness. 4. The left ventricular systolic function is severely decreased, LVEF is visually estimated at 20-25%. 5. There is moderate mitral valve regurgitation. 6. There is mild to moderate aortic valve stenosis. 7. There is mild aortic regurgitation. 8. The left atrium is moderately dilated in size. 9. The right  ventricle is normal in size, with normal systolic function. 10. The right atrium is mildly dilated in size.   02/2020 RHC/LHC Prox RCA to Mid RCA lesion is 50% stenosed. Mid RCA lesion is 100% stenosed. SVG graft was visualized by angiography and is normal in caliber. Ost LAD to Prox LAD lesion is 100% stenosed. SVG graft was visualized by angiography and is normal in caliber. Prox Cx lesion is 100% stenosed. Mid LAD to Dist LAD lesion is 50% stenosed.   1. Severe triple vessel CAD s/p 5V CABG with 5/5 patent bypass grafts. 2. The LAD is occluded proximally. The LAD and diagonal fill from the patent vein graft. The LIMA does not appear to have been used for bypass. 3. The Circumflex is occluded in the mid vessel. The patent sequential vein graft fills the proximal and distal OM branches 4. The RCA is occluded in the mid to distal vessel. The distal RCA/PDA and PLA fills from left to right collaterals and from the patent vein graft 5. Normal filling pressures.   Recommendations: Continue medical management of CAD.    11/2019 [facility] Zio patch: min HR 57, Max HR 255, Avg HF 101.4.8% PVCs. Rare supraventricular ectopy, some runs of atach. Short runs of NSVT   03/2020 [facility] IMPRESSION: 1. Mild LV dilatation with severe systolic dysfunction (EF 26%). 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 Hypokinesis of mid anterior/anterolateral walls and apical anterior/septal/lateral walls. Akinesis of mid inferolateral wall   2. Subendocardial late gadolinium enhancement consistent with prior infarcts in LAD and LCX territories. LGE is <50% transmural suggesting viability in mid anterior/anterolateral walls and apical septal/anterior/inferior/lateral walls. LGE >50% transmural suggesting nonviability at apex. In addition, there is significant thinning in mid inferolateral wall (wall thickness 4mm), suggesting nonviability.   3.  Normal RV size and systolic function (EF 50%)   4.  Mild mitral regurgitation  (regurgitant fraction 19%)     01/2021 echo 1. Left ventricular ejection fraction, by estimation, is 40 to 45%. The  left ventricle has mildly decreased function. The left ventricle  demonstrates regional wall motion abnormalities (see scoring  diagram/findings for description). The left ventricular   internal cavity size was mildly to moderately dilated. Left ventricular  diastolic parameters are consistent with Grade I diastolic dysfunction  (impaired relaxation).  Reduced global longitudinal strain of -10.3%. Would  consider future studies with  Definity contrast.   2. Right ventricular systolic function is normal. The right ventricular  size is normal. Tricuspid regurgitation signal is inadequate for assessing  PA pressure.   3. Left atrial size was mildly dilated.   4. The mitral valve is grossly normal. Mild to moderate mitral valve  regurgitation.   5. The aortic valve has an indeterminant number of cusps. There is  moderate calcification of the aortic valve. Aortic valve regurgitation is  mild. Moderate aortic valve stenosis. Aortic regurgitation PHT measures  758 msec. Aortic valve mean gradient  measures 18.0 mmHg. Dimentionless index 0.36.   6. The inferior vena cava is normal in size with greater than 50%  respiratory variability, suggesting right atrial pressure of 3 mmHg.    01/2022 echo  1. Acoustic shadowing at LV apex - cannot exclude relatively large,  rounded mural thormbus. Suggest Definity contrast study to clarify.   2. Left ventricular ejection fraction, by estimation, is 40 to 45%. The  left ventricle has mildly decreased function. The left ventricle  demonstrates regional wall motion abnormalities (see scoring  diagram/findings for description). The left ventricular   internal cavity size was mildly dilated. Left ventricular diastolic  parameters are consistent with Grade II diastolic dysfunction  (pseudonormalization). Elevated left ventricular end-diastolic  pressure.  The average left ventricular global longitudinal  strain is -11.7 %. The global longitudinal strain is abnormal.   3. Right ventricular systolic function is normal. The right ventricular  size is normal. The estimated right ventricular systolic pressure is 19.6  mmHg.   4. Left atrial size was mildly dilated.   5. The mitral valve is degenerative. Mild to moderate mitral valve  regurgitation.   6. The aortic valve is tricuspid. There is moderate calcification of the  aortic valve. Aortic valve regurgitation is mild. Moderate aortic valve  stenosis. Aortic regurgitation PHT measures 416 msec. Aortic valve mean  gradient measures 20.0 mmHg.  Dimentionless index 0.31.   7. The inferior vena cava is normal in size with greater than 50%  respiratory variability, suggesting right atrial pressure of 3 mmHg.      02/2022 echo 1. Left ventricular ejection fraction, by estimation, is 40 to 45%. The  left ventricle has mildly decreased function. The left ventricle  demonstrates regional wall motion abnormalities (see scoring  diagram/findings for description).   2. No apical thrombus seen      03/2023 RHC/LHC Ost LAD to Prox LAD lesion is 100% stenosed.   Mid LAD to Dist LAD lesion is 50% stenosed.   Prox Cx lesion is 100% stenosed.   Prox RCA to Mid RCA lesion is 50% stenosed.   Mid RCA lesion is 100% stenosed.   SVG and is normal in caliber.   SVG and is normal in caliber.    1. Severe triple vessel CAD s/p 5V CABG with 5/5 patent bypass grafts.  2. The LAD is known to be occluded proximally (not engaged today). The LAD and diagonal fill from the patent vein graft. The LIMA was not used for bypass.  3. The Circumflex is occluded in the proximal to mid vessel, known from prior cath and not engaged today. The patent sequential vein graft fills the proximal and distal OM branches 4. The RCA is occluded in the mid to distal vessel. The distal RCA/PDA and PLA fills from left to  right collaterals and from the patent vein graft 5. Elevated right  heart pressures   Recommendations: Medical management of CAD. Will continue workup for TAVR. We will arrange CT scans next and then get him in to see Dr. [name]. He will increase his Lasix to 40 mg daily.    Assessment and Plan   1. Chronic HFpEF -recurrent drop in LVEF. Recent TAVR, f/u Echo shows LVEF has further declined.  - hyperkalemia on aldactone, then recurrent hyperkalemia on entresto. Off both medications - we will try losartan 12.5mg  daily, recheck bmet 2 weeks. Hopefully will tolerate from a potassium standpoint - increase toprol to 200mg  daily.  - f/u 3 weeks for further medication titration.        2. CAD - recent cath with patent grafts - no symptoms, continue current meds   3. Aortic stenosis - s/p TAVR, echo yesterday showed normal AVR     Antoine Poche, M.D.

## 2023-05-14 NOTE — Patient Instructions (Signed)
Medication Instructions:   Increase Toprol XL to 200mg  daily  Begin Losartan 12.5mg  daily  Continue all other medications.     Labwork:  BMET  - order given  Please do in 2 weeks  Office will contact with results via phone, letter or mychart.     Testing/Procedures:  none  Follow-Up:  3 weeks    Any Other Special Instructions Will Be Listed Below (If Applicable).   If you need a refill on your cardiac medications before your next appointment, please call your pharmacy.

## 2023-05-18 ENCOUNTER — Other Ambulatory Visit: Payer: Self-pay | Admitting: Physician Assistant

## 2023-05-18 DIAGNOSIS — K639 Disease of intestine, unspecified: Secondary | ICD-10-CM

## 2023-05-25 DIAGNOSIS — I5032 Chronic diastolic (congestive) heart failure: Secondary | ICD-10-CM | POA: Diagnosis not present

## 2023-05-25 DIAGNOSIS — Z79899 Other long term (current) drug therapy: Secondary | ICD-10-CM | POA: Diagnosis not present

## 2023-05-26 LAB — BASIC METABOLIC PANEL
BUN/Creatinine Ratio: 24 (calc) — ABNORMAL HIGH (ref 6–22)
BUN: 31 mg/dL — ABNORMAL HIGH (ref 7–25)
CO2: 26 mmol/L (ref 20–32)
Calcium: 9.7 mg/dL (ref 8.6–10.3)
Chloride: 101 mmol/L (ref 98–110)
Creat: 1.31 mg/dL — ABNORMAL HIGH (ref 0.70–1.28)
Glucose, Bld: 214 mg/dL — ABNORMAL HIGH (ref 65–99)
Potassium: 4.8 mmol/L (ref 3.5–5.3)
Sodium: 138 mmol/L (ref 135–146)

## 2023-06-03 ENCOUNTER — Ambulatory Visit: Payer: PPO | Attending: Cardiology | Admitting: Cardiology

## 2023-06-03 ENCOUNTER — Encounter: Payer: Self-pay | Admitting: Cardiology

## 2023-06-03 VITALS — BP 118/70 | HR 73 | Ht 70.0 in | Wt 218.0 lb

## 2023-06-03 DIAGNOSIS — Z79899 Other long term (current) drug therapy: Secondary | ICD-10-CM

## 2023-06-03 DIAGNOSIS — I5022 Chronic systolic (congestive) heart failure: Secondary | ICD-10-CM | POA: Diagnosis not present

## 2023-06-03 NOTE — Patient Instructions (Addendum)
Medication Instructions:  Your physician recommends that you continue on your current medications as directed. Please refer to the Current Medication list given to you today.   Labwork: BMET in 2 weeks at [facility]  Testing/Procedures: None  Follow-Up: Your physician recommends that you schedule a follow-up appointment in: 2 months  Any Other Special Instructions Will Be Listed Below (If Applicable). Thank you for choosing [facility]!      If you need a refill on your cardiac medications before your next appointment, please call your pharmacy.

## 2023-06-03 NOTE — Progress Notes (Signed)
Clinical Summary Kenneth Randall is a 77 y.o.male seen today for follow up of the following medical problems.         1. Chronic HFrEF/ICM - from [facility] notes 12/2019 echo showed LVEF 20-25%, new diagnosis for patient - at that time had significant volume overload, succesfully diuresed and started on medical therapy.   02/2020 cath: occluded LAD, occluded LCX, occluded RCA. SVG-RPDA, SVG-D2/distal LAD patent, sequential graft to OM1 patent. Mean PA 20, PCWP 10, LVEDP 7, CI 2.5  - 03/2020 CMRI: consistent with prior scar, LVEF 30%  - 01/2021 echo LVEF 40-45%   -hyperkalemia on entresto alone, also with aldactone alone.    -02/2022 echo: LVEF 40-45%, apex akinetic  -02/2023 echo: LVEF 30-35%, grade II dd, mod to severe low flow low gradient AS mean grad 20, AVA VTI 0.97 DI 0.25, mod to severe AI -03/2023 RHC/LHC: LAD CTO, LCX CTO, RCA mid CTO. SVG-RPDA patent, SVG-d2/distal LAD patent, SVT-OM1/OM2 patent Mean PA 43 PCWP 30 CI2 - s/p TAVR 04/14/23   -05/2023 echo: LVEF 25-30%, normal AVR     - 05/14/23 tried starting losartan 12.5mg  daily. Cr 1.16-->1.31, K 4.1-->4.8 - we also increased toprol to 200mg  daily.  **repeat bmet   Other medical problems not addressed this visit     2. PSVT - noted on prior zio patch done at [facility] - no recent palptiations       3. CAD - history of 5 vessel CABG in 1999 -02/2020 cath: occluded LAD, occluded LCX, occluded RCA. SVG-RPDA, SVG-D2/distal LAD patent, sequential graft to OM1 patent. Mean PA 20, PCWP 10, LVEDP 7, CI 2.5   -no chest pains.       4. HTN - he is compliant with meds      5. Aortic stenosis - 01/2021 echo mean grade 18, AVA VTI 1.02. Mild to moderate AS.   - 01/2022 echo: mod AS mean grad 20, AVA VTI  0.87, DI 0.31, SVI 25   7 /2024 echo: LVEF 30-35%, grade II dd, mod to severe low flow low gradient AS mean grad 20, AVA VTI 0.97 DI 0.25, mod to severe AI   04/14/23 had TAVR,  Kenneth Randall 3 Ultra Resilia THV size 29 mm  05/2023  echo: LVEF 25-30%, normal AVR    6. Hyperlipidemia -muscle aches on crestor 40mg , tolerating 20mg       7. Colon lesion -  TAVR CT showed a "calcified exophytic lesion of the descending colon measuring 2.2 cm, possibly sequela of prior fat infarction although neoplasm cannot be excluded. Recommend comparison with more remote prior exams, if available, to ensure long-term stability. Alternatively, recommend follow-up CT of the abdomen and pelvis in 3 months to ensure stability  Past Medical History:  Diagnosis Date   Aortic stenosis    CAD (coronary artery disease)    Cancer (HCC)    skin   Hypertension    Ischemic cardiomyopathy    Pre-diabetes    PSVT (paroxysmal supraventricular tachycardia) (HCC)    S/P TAVR (transcatheter aortic valve replacement) 04/14/2023   s/p TAVR with a 26 mm Kenneth S3UR via the TF approach by Dr. Clifton Randall & Dr. Laneta Randall     No Known Allergies   Current Outpatient Medications  Medication Sig Dispense Refill   acetaminophen (TYLENOL) 650 MG CR tablet Take 1,300 mg by mouth every 8 (eight) hours as needed for pain.     amoxicillin (AMOXIL) 500 MG tablet Take 4 tablets (2,000 mg total) by mouth  as directed. 1 hour prior to dental work including cleanings 12 tablet 12   aspirin EC 81 MG tablet Take 1 tablet (81 mg total) by mouth daily. Swallow whole.     Coenzyme Q10 (COQ10) 100 MG CAPS Take 100 mg by mouth daily.     furosemide (LASIX) 40 MG tablet Take 1 tablet (40 mg total) by mouth daily. 90 tablet 1   glipiZIDE (GLUCOTROL XL) 5 MG 24 hr tablet Take 5 mg by mouth daily.     hydrALAZINE (APRESOLINE) 25 MG tablet TAKE 1 TABLET BY MOUTH TWICE A DAY 180 tablet 1   isosorbide mononitrate (IMDUR) 30 MG 24 hr tablet TAKE 1/2 TABLET BY MOUTH DAILY 135 tablet 3   JARDIANCE 10 MG TABS tablet Take 10 mg by mouth daily.     levothyroxine (SYNTHROID) 50 MCG tablet Take 50 mcg by mouth daily.     losartan (COZAAR) 25 MG tablet Take 0.5 tablets (12.5 mg total) by  mouth daily. 15 tablet 6   metFORMIN (GLUCOPHAGE-XR) 500 MG 24 hr tablet Take 500 mg by mouth 2 (two) times daily.     metoprolol succinate (TOPROL-XL) 200 MG 24 hr tablet Take 1 tablet (200 mg total) by mouth daily. 30 tablet 6   rosuvastatin (CRESTOR) 20 MG tablet Take 1 tablet (20 mg total) by mouth daily. 90 tablet 1   No current facility-administered medications for this visit.     Past Surgical History:  Procedure Laterality Date   INTRAOPERATIVE TRANSTHORACIC ECHOCARDIOGRAM N/A 04/14/2023   Procedure: INTRAOPERATIVE TRANSTHORACIC ECHOCARDIOGRAM;  Surgeon: Kenneth Hazel, MD;  Location: [facility] INVASIVE [facility];  Service: Open Heart Surgery;  Laterality: N/A;   RIGHT/LEFT HEART CATH AND CORONARY/GRAFT ANGIOGRAPHY N/A 02/08/2020   Procedure: RIGHT/LEFT HEART CATH AND CORONARY/GRAFT ANGIOGRAPHY;  Surgeon: Kenneth Hazel, MD;  Location: [facility] INVASIVE CV LAB;  Service: Cardiovascular;  Laterality: N/A;   RIGHT/LEFT HEART CATH AND CORONARY/GRAFT ANGIOGRAPHY N/A 03/30/2023   Procedure: RIGHT/LEFT HEART CATH AND CORONARY/GRAFT ANGIOGRAPHY;  Surgeon: Kenneth Hazel, MD;  Location: [facility] INVASIVE CV LAB;  Service: Cardiovascular;  Laterality: N/A;   SKIN CANCER EXCISION     TRANSCATHETER AORTIC VALVE REPLACEMENT, TRANSFEMORAL N/A 04/14/2023   Procedure: Transcatheter Aortic Valve Replacement, Transfemoral;  Surgeon: Kenneth Hazel, MD;  Location: [facility] INVASIVE CV LAB;  Service: Open Heart Surgery;  Laterality: N/A;     No Known Allergies    Family History  Problem Relation Age of Onset   Heart attack Mother 33   Rheum arthritis Mother    Diabetes Father    Heart disease Father        died after bypass surgery   Cancer Brother        jaw    Stroke Paternal Grandmother      Social History Kenneth Randall reports that he quit smoking about 33 years ago. His smoking use included cigarettes. He has never used smokeless tobacco. Kenneth Randall reports current alcohol  use.   Review of Systems CONSTITUTIONAL: No weight loss, fever, chills, weakness or fatigue.  HEENT: Eyes: No visual loss, blurred vision, double vision or yellow sclerae.No hearing loss, sneezing, congestion, runny nose or sore throat.  SKIN: No rash or itching.  CARDIOVASCULAR: per hpi RESPIRATORY: No shortness of breath, cough or sputum.  GASTROINTESTINAL: No anorexia, nausea, vomiting or diarrhea. No abdominal pain or blood.  GENITOURINARY: No burning on urination, no polyuria NEUROLOGICAL: No headache, dizziness, syncope, paralysis, ataxia, numbness or tingling in the extremities. No change  in bowel or bladder control.  MUSCULOSKELETAL: No muscle, back pain, joint pain or stiffness.  LYMPHATICS: No enlarged nodes. No history of splenectomy.  PSYCHIATRIC: No history of depression or anxiety.  ENDOCRINOLOGIC: No reports of sweating, cold or heat intolerance. No polyuria or polydipsia.  Marland Kitchen   Physical Examination Today's Vitals   06/03/23 1130  BP: 118/70  Pulse: 73  SpO2: 98%  Weight: 218 lb (98.9 kg)  Height: 5\' 10"  (1.778 m)   Body mass index is 31.28 kg/m.  Gen: resting comfortably, no acute distress HEENT: no scleral icterus, pupils equal round and reactive, no palptable cervical adenopathy,  CV: RRR, no m/rg, no jvd Resp: Clear to auscultation bilaterally GI: abdomen is soft, non-tender, non-distended, normal bowel sounds, no hepatosplenomegaly MSK: extremities are warm, no edema.  Skin: warm, no rash Neuro:  no focal deficits Psych: appropriate affect   Diagnostic Studies  Echo: 12/2019 Summary 1. Technically difficult study due to body habitus. 2. Echo contrast utilized to enhance endocardial border definition. 3. The left ventricle is mildly to moderately dilated in size with upper normal wall thickness. 4. The left ventricular systolic function is severely decreased, LVEF is visually estimated at 20-25%. 5. There is moderate mitral valve regurgitation. 6.  There is mild to moderate aortic valve stenosis. 7. There is mild aortic regurgitation. 8. The left atrium is moderately dilated in size. 9. The right ventricle is normal in size, with normal systolic function. 10. The right atrium is mildly dilated in size.   02/2020 RHC/LHC Prox RCA to Mid RCA lesion is 50% stenosed. Mid RCA lesion is 100% stenosed. SVG graft was visualized by angiography and is normal in caliber. Ost LAD to Prox LAD lesion is 100% stenosed. SVG graft was visualized by angiography and is normal in caliber. Prox Cx lesion is 100% stenosed. Mid LAD to Dist LAD lesion is 50% stenosed.   1. Severe triple vessel CAD s/p 5V CABG with 5/5 patent bypass grafts. 2. The LAD is occluded proximally. The LAD and diagonal fill from the patent vein graft. The LIMA does not appear to have been used for bypass. 3. The Circumflex is occluded in the mid vessel. The patent sequential vein graft fills the proximal and distal OM branches 4. The RCA is occluded in the mid to distal vessel. The distal RCA/PDA and PLA fills from left to right collaterals and from the patent vein graft 5. Normal filling pressures.   Recommendations: Continue medical management of CAD.    11/2019 [facility] Zio patch: min HR 57, Max HR 255, Avg HF 101.4.8% PVCs. Rare supraventricular ectopy, some runs of atach. Short runs of NSVT   03/2020 CMRI IMPRESSION: 1. Mild LV dilatation with severe systolic dysfunction (EF 26%). 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 Hypokinesis of mid anterior/anterolateral walls and apical anterior/septal/lateral walls. Akinesis of mid inferolateral wall   2. Subendocardial late gadolinium enhancement consistent with prior infarcts in LAD and LCX territories. LGE is <50% transmural suggesting viability in mid anterior/anterolateral walls and apical septal/anterior/inferior/lateral walls. LGE >50% transmural suggesting nonviability at apex. In addition, there is significant thinning in mid  inferolateral wall (wall thickness 4mm), suggesting nonviability.   3.  Normal RV size and systolic function (EF 50%)   4.  Mild mitral regurgitation (regurgitant fraction 19%)     01/2021 echo 1. Left ventricular ejection fraction, by estimation, is 40 to 45%. The  left ventricle has mildly decreased function. The left ventricle  demonstrates regional wall motion abnormalities (see scoring  diagram/findings for description). The left ventricular   internal cavity size was mildly to moderately dilated. Left ventricular  diastolic parameters are consistent with Grade I diastolic dysfunction  (impaired relaxation). Reduced global longitudinal strain of -10.3%. Would  consider future studies with  Definity contrast.   2. Right ventricular systolic function is normal. The right ventricular  size is normal. Tricuspid regurgitation signal is inadequate for assessing  PA pressure.   3. Left atrial size was mildly dilated.   4. The mitral valve is grossly normal. Mild to moderate mitral valve  regurgitation.   5. The aortic valve has an indeterminant number of cusps. There is  moderate calcification of the aortic valve. Aortic valve regurgitation is  mild. Moderate aortic valve stenosis. Aortic regurgitation PHT measures  758 msec. Aortic valve mean gradient  measures 18.0 mmHg. Dimentionless index 0.36.   6. The inferior vena cava is normal in size with greater than 50%  respiratory variability, suggesting right atrial pressure of 3 mmHg.    01/2022 echo  1. Acoustic shadowing at LV apex - cannot exclude relatively large,  rounded mural thormbus. Suggest Definity contrast study to clarify.   2. Left ventricular ejection fraction, by estimation, is 40 to 45%. The  left ventricle has mildly decreased function. The left ventricle  demonstrates regional wall motion abnormalities (see scoring  diagram/findings for description). The left ventricular   internal cavity size was mildly dilated.  Left ventricular diastolic  parameters are consistent with Grade II diastolic dysfunction  (pseudonormalization). Elevated left ventricular end-diastolic pressure.  The average left ventricular global longitudinal  strain is -11.7 %. The global longitudinal strain is abnormal.   3. Right ventricular systolic function is normal. The right ventricular  size is normal. The estimated right ventricular systolic pressure is 19.6  mmHg.   4. Left atrial size was mildly dilated.   5. The mitral valve is degenerative. Mild to moderate mitral valve  regurgitation.   6. The aortic valve is tricuspid. There is moderate calcification of the  aortic valve. Aortic valve regurgitation is mild. Moderate aortic valve  stenosis. Aortic regurgitation PHT measures 416 msec. Aortic valve mean  gradient measures 20.0 mmHg.  Dimentionless index 0.31.   7. The inferior vena cava is normal in size with greater than 50%  respiratory variability, suggesting right atrial pressure of 3 mmHg.      02/2022 echo 1. Left ventricular ejection fraction, by estimation, is 40 to 45%. The  left ventricle has mildly decreased function. The left ventricle  demonstrates regional wall motion abnormalities (see scoring  diagram/findings for description).   2. No apical thrombus seen      03/2023 RHC/LHC Ost LAD to Prox LAD lesion is 100% stenosed.   Mid LAD to Dist LAD lesion is 50% stenosed.   Prox Cx lesion is 100% stenosed.   Prox RCA to Mid RCA lesion is 50% stenosed.   Mid RCA lesion is 100% stenosed.   SVG and is normal in caliber.   SVG and is normal in caliber.    1. Severe triple vessel CAD s/p 5V CABG with 5/5 patent bypass grafts.  2. The LAD is known to be occluded proximally (not engaged today). The LAD and diagonal fill from the patent vein graft. The LIMA was not used for bypass.  3. The Circumflex is occluded in the proximal to mid vessel, known from prior cath and not engaged today. The patent sequential  vein graft fills the proximal and distal OM branches  4. The RCA is occluded in the mid to distal vessel. The distal RCA/PDA and PLA fills from left to right collaterals and from the patent vein graft 5. Elevated right heart pressures   Recommendations: Medical management of CAD. Will continue workup for TAVR. We will arrange CT scans next and then get him in to see Dr. Laneta Randall. He will increase his Lasix to 40 mg daily.    Assessment and Plan   1. Chronic HFpEF -recurrent drop in LVEF. Recent TAVR, f/u Echo shows LVEF has further declined.  - hyperkalemia on aldactone, then recurrent hyperkalemia on entresto. Off both medications - most recently tried losartan 12.5mg  daily, slight uptrend in K and Cr but acceptable level. Will repeat bmet 2 weeks - continue current meds              Antoine Poche, M.D.

## 2023-06-12 ENCOUNTER — Other Ambulatory Visit: Payer: Self-pay | Admitting: Medical Genetics

## 2023-06-12 DIAGNOSIS — Z006 Encounter for examination for normal comparison and control in clinical research program: Secondary | ICD-10-CM

## 2023-06-17 DIAGNOSIS — I2510 Atherosclerotic heart disease of native coronary artery without angina pectoris: Secondary | ICD-10-CM | POA: Diagnosis not present

## 2023-06-17 DIAGNOSIS — Z79899 Other long term (current) drug therapy: Secondary | ICD-10-CM | POA: Diagnosis not present

## 2023-06-18 LAB — BASIC METABOLIC PANEL
BUN/Creatinine Ratio: 20 (ref 10–24)
BUN: 26 mg/dL (ref 8–27)
CO2: 22 mmol/L (ref 20–29)
Calcium: 9.5 mg/dL (ref 8.6–10.2)
Chloride: 102 mmol/L (ref 96–106)
Creatinine, Ser: 1.27 mg/dL (ref 0.76–1.27)
Glucose: 139 mg/dL — ABNORMAL HIGH (ref 70–99)
Potassium: 4.8 mmol/L (ref 3.5–5.2)
Sodium: 141 mmol/L (ref 134–144)
eGFR: 59 mL/min/{1.73_m2} — ABNORMAL LOW (ref >59)

## 2023-06-22 ENCOUNTER — Other Ambulatory Visit (HOSPITAL_COMMUNITY)
Admission: RE | Admit: 2023-06-22 | Discharge: 2023-06-22 | Disposition: A | Discharge status: Home / Self Care | Payer: PPO | Source: Ambulatory Visit | Attending: Medical Genetics | Admitting: Medical Genetics

## 2023-06-22 DIAGNOSIS — Z006 Encounter for examination for normal comparison and control in clinical research program: Secondary | ICD-10-CM

## 2023-06-30 ENCOUNTER — Encounter: Payer: Self-pay | Admitting: *Deleted

## 2023-06-30 LAB — GENECONNECT MOLECULAR SCREEN: Genetic Analysis Overall Interpretation: NEGATIVE

## 2023-07-08 ENCOUNTER — Ambulatory Visit (HOSPITAL_COMMUNITY)
Admission: RE | Admit: 2023-07-08 | Discharge: 2023-07-08 | Disposition: A | Discharge status: Home / Self Care | Payer: PPO | Source: Ambulatory Visit | Attending: Physician Assistant | Admitting: Physician Assistant

## 2023-07-08 DIAGNOSIS — K639 Disease of intestine, unspecified: Secondary | ICD-10-CM | POA: Diagnosis not present

## 2023-07-08 DIAGNOSIS — N200 Calculus of kidney: Secondary | ICD-10-CM | POA: Diagnosis not present

## 2023-07-08 DIAGNOSIS — K4090 Unilateral inguinal hernia, without obstruction or gangrene, not specified as recurrent: Secondary | ICD-10-CM | POA: Diagnosis not present

## 2023-07-08 MED ORDER — IOHEXOL 300 MG/ML  SOLN
100.0000 mL | Freq: Once | INTRAMUSCULAR | Status: AC | PRN
  Administered 2023-07-08: 100 mL via INTRAVENOUS

## 2023-07-15 NOTE — Progress Notes (Signed)
On 05/13/23 my office note says  Lesion of colon: pre TAVR CT showed a "calcified exophytic lesion of the descending colon measuring 2.2 cm, possibly sequela of prior fat infarction although neoplasm cannot be excluded. Recommend comparison with more remote prior exams, if available, to ensure long-term stability. Alternatively, recommend follow-up CT of the abdomen and pelvis in 3 months to ensure stability." I will get this set up for December.

## 2023-07-29 ENCOUNTER — Other Ambulatory Visit: Payer: Self-pay | Admitting: Cardiology

## 2023-08-03 ENCOUNTER — Ambulatory Visit: Payer: PPO | Attending: Nurse Practitioner | Admitting: Nurse Practitioner

## 2023-08-03 VITALS — BP 128/74 | HR 70 | Ht 70.0 in | Wt 227.8 lb

## 2023-08-03 DIAGNOSIS — E785 Hyperlipidemia, unspecified: Secondary | ICD-10-CM

## 2023-08-03 DIAGNOSIS — I2510 Atherosclerotic heart disease of native coronary artery without angina pectoris: Secondary | ICD-10-CM | POA: Diagnosis not present

## 2023-08-03 DIAGNOSIS — I5022 Chronic systolic (congestive) heart failure: Secondary | ICD-10-CM

## 2023-08-03 DIAGNOSIS — Z952 Presence of prosthetic heart valve: Secondary | ICD-10-CM | POA: Diagnosis not present

## 2023-08-03 DIAGNOSIS — I10 Essential (primary) hypertension: Secondary | ICD-10-CM

## 2023-08-03 DIAGNOSIS — I255 Ischemic cardiomyopathy: Secondary | ICD-10-CM

## 2023-08-03 MED ORDER — LOSARTAN POTASSIUM 25 MG PO TABS: 25.0000 mg | ORAL_TABLET | Freq: Every day | ORAL | 1 refills | Status: DC

## 2023-08-03 NOTE — Progress Notes (Signed)
Cardiology Office Note:   .   Date:  08/03/2023  ID:  YAHEL LIMING, DOB June 15, 1946, MRN [id number] PCP: Donetta Potts, MD  [facility] Providers Cardiologist:  Dina Rich, MD    History of Present Illness: .   DRAELYN SERVICE is a 77 y.o. male with a PMH of chronic HFrEF, ICM, CAD, s/p CABG x 5 in 1999, hypertension, hyperlipidemia, aortic valve stenosis, s/p TAVR in September 2024, colon lesion, who presents today for 62-month follow-up.  Last seen by Dr. Dina Rich on June 03, 2023.  It was noted he had a recurrent drop in EF that was seen on echocardiogram after his recent TAVR.  Medication was limited as he had a history of hyperkalemia on Entresto and Aldactone, therefore he was off both medications at the time.  Lab work was arranged and kidney function was overall unremarkable.  Today he presents for 12-month follow-up appointment.  He states he is doing well. Denies any acute cardiac complaints or issues. Denies any chest pain, shortness of breath, palpitations, syncope, presyncope, dizziness, orthopnea, PND, swelling or significant weight changes, acute bleeding, or claudication.   ROS: Negative. See HPI.  SH: Enjoys working out on bike/elliptical   Studies Reviewed: Marland Kitchen    EKG: EKG  is not ordered today.   Echo 05/2023:  1. Left ventricular ejection fraction, by estimation, is 25 to 30%. The  left ventricle has severely decreased function. The left ventricle  demonstrates regional wall motion abnormalities (see scoring  diagram/findings for description). There is mild left ventricular hypertrophy. Left ventricular diastolic parameters are indeterminate.   2. Swirling LV apical contrast without discrete thrombus.   3. Right ventricular systolic function is normal. The right ventricular  size is normal. Tricuspid regurgitation signal is inadequate for assessing PA pressure.   4. The mitral valve is grossly normal. Mild mitral valve regurgitation.  No  evidence of mitral stenosis.   5. The aortic valve has been repaired/replaced. Aortic valve  regurgitation is trivial but not well localized, likely paravalvular.  There is a 26 mm Edwards Sapien prosthetic (TAVR) valve present in the aortic position. Procedure Date: 04/14/2023. Echo findings are consistent with normal structure and function of the aortic  valve prosthesis. Aortic valve area, by VTI measures 2.18 cm. Aortic  valve mean gradient measures 5.5 mmHg. Aortic valve Vmax measures 1.57 m/s.   6. The inferior vena cava is normal in size with greater than 50%  respiratory variability, suggesting right atrial pressure of 3 mmHg.   Right/left heart cath 03/2023:    Ost LAD to Prox LAD lesion is 100% stenosed.   Mid LAD to Dist LAD lesion is 50% stenosed.   Prox Cx lesion is 100% stenosed.   Prox RCA to Mid RCA lesion is 50% stenosed.   Mid RCA lesion is 100% stenosed.   SVG and is normal in caliber.   SVG and is normal in caliber.   1. Severe triple vessel CAD s/p 5V CABG with 5/5 patent bypass grafts.  2. The LAD is known to be occluded proximally (not engaged today). The LAD and diagonal fill from the patent vein graft. The LIMA was not used for bypass.  3. The Circumflex is occluded in the proximal to mid vessel, known from prior cath and not engaged today. The patent sequential vein graft fills the proximal and distal OM branches 4. The RCA is occluded in the mid to distal vessel. The distal RCA/PDA and PLA fills from left  to right collaterals and from the patent vein graft 5. Elevated right heart pressures   Recommendations: Medical management of CAD. Will continue workup for TAVR. We will arrange CT scans next and then get him in to see Dr. [name]. He will increase his Lasix to 40 mg daily.   CMRI 03/2020:  IMPRESSION: 1. Mild LV dilatation with severe systolic dysfunction (EF 26%). 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 Hypokinesis of mid anterior/anterolateral walls and apical  anterior/septal/lateral walls. Akinesis of mid inferolateral wall   2. Subendocardial late gadolinium enhancement consistent with prior infarcts in LAD and LCX territories. LGE is <50% transmural suggesting viability in mid anterior/anterolateral walls and apical septal/anterior/inferior/lateral walls. LGE >50% transmural suggesting nonviability at apex. In addition, there is significant thinning in mid inferolateral wall (wall thickness 4mm), suggesting nonviability.   3.  Normal RV size and systolic function (EF 50%)   4.  Mild mitral regurgitation (regurgitant fraction 19%)  Physical Exam:   VS:  BP 128/74 (BP Location: Left Arm, Cuff Size: Normal)   Pulse 70   Ht 5\' 10"  (1.778 m)   Wt 227 lb 12.8 oz (103.3 kg)   SpO2 99%   BMI 32.69 kg/m    Wt Readings from Last 3 Encounters:  08/03/23 227 lb 12.8 oz (103.3 kg)  06/03/23 218 lb (98.9 kg)  05/14/23 216 lb 12.8 oz (98.3 kg)    GEN: Obese, 77 y.o. male in no acute distress NECK: No JVD; No carotid bruits CARDIAC: S1/S2, RRR, no murmurs, rubs, gallops RESPIRATORY:  Clear to auscultation without rales, wheezing or rhonchi  ABDOMEN: Soft, non-tender, non-distended EXTREMITIES:  No edema; No deformity   ASSESSMENT AND PLAN: .    Chronic HFrEF, ICM Stage C, NYHA class I symptoms. EF 25% 05/2023. Euvolemic and well compensated on exam. Does appear weight is up d/t caloric intake that patient attributes to. GDMT limited d/t past history of hyperkalemia on Entresto and Aldactone, therefore he was off both medications at the time. Past blood work was overall unremarkable. Will increase Losartan to 25 mg daily. Continue Lasix, hydralazine, Imdur, and Toprol XL. Low sodium diet, fluid restriction <2L, and daily weights encouraged. Educated to contact our office for weight gain of 2 lbs overnight or 5 lbs in one week.  CAD, s/p CABG x 5 in 1999 Stable with no anginal symptoms. No indication for ischemic evaluation. Continue Aspirin,  Imdur, Toprol XL, rosuvastatin and will increase Losartan as mentioned above. Heart healthy diet and regular cardiovascular exercise encouraged.   AS, s/p TAVR Doing well post TAVR in September 2024. See Echo report from 05/2023 noted above. SBE prophylaxis discussed and he verbalized understanding. Continue to follow-up with structural heart team.   HTN BP stable. Discussed to monitor BP at home at least 2 hours after medications and sitting for 5-10 minutes. Increasing Losartan as mentioned above. Will recheck a BMET in 1-2 weeks. Heart healthy diet and regular cardiovascular exercise encouraged.   HLD LDL 80 in June 2023. LDL goal < 55. Continue rosuvastatin. Not addressed, but at next office visit, plan to address adjusting medications to further lower LDL. Heart healthy diet and regular cardiovascular exercise encouraged. Possibly add Zetia.      Dispo: Follow-up with me/APP in 2 months or sooner if anything changes.   Signed, Sharlene Dory, NP

## 2023-08-03 NOTE — Patient Instructions (Addendum)
Medication Instructions:  Your physician has recommended you make the following change in your medication:  Please increase Losartan to  25 mg daily   Labwork: In 2 weeks   Testing/Procedures: None   Follow-Up: Your physician recommends that you schedule a follow-up appointment in: 2 months   Any Other Special Instructions Will Be Listed Below (If Applicable).   If you need a refill on your cardiac medications before your next appointment, please call your pharmacy.

## 2023-08-19 ENCOUNTER — Other Ambulatory Visit (HOSPITAL_BASED_OUTPATIENT_CLINIC_OR_DEPARTMENT_OTHER): Payer: Self-pay | Admitting: Nurse Practitioner

## 2023-08-19 DIAGNOSIS — I5023 Acute on chronic systolic (congestive) heart failure: Secondary | ICD-10-CM | POA: Diagnosis not present

## 2023-08-20 LAB — BASIC METABOLIC PANEL
BUN/Creatinine Ratio: 21 (ref 10–24)
BUN: 26 mg/dL (ref 8–27)
CO2: 21 mmol/L (ref 20–29)
Calcium: 9.5 mg/dL (ref 8.6–10.2)
Chloride: 102 mmol/L (ref 96–106)
Creatinine, Ser: 1.24 mg/dL (ref 0.76–1.27)
Glucose: 134 mg/dL — ABNORMAL HIGH (ref 70–99)
Potassium: 4.7 mmol/L (ref 3.5–5.2)
Sodium: 143 mmol/L (ref 134–144)
eGFR: 60 mL/min/{1.73_m2} (ref >59)

## 2023-08-30 ENCOUNTER — Other Ambulatory Visit: Payer: Self-pay | Admitting: Cardiology

## 2023-09-02 ENCOUNTER — Other Ambulatory Visit: Payer: Self-pay | Admitting: Cardiology

## 2023-10-02 ENCOUNTER — Ambulatory Visit: Payer: PPO | Attending: Nurse Practitioner | Admitting: Nurse Practitioner

## 2023-10-02 ENCOUNTER — Encounter: Payer: Self-pay | Admitting: Nurse Practitioner

## 2023-10-02 VITALS — BP 128/78 | HR 68 | Ht 70.0 in | Wt 224.0 lb

## 2023-10-02 DIAGNOSIS — I2510 Atherosclerotic heart disease of native coronary artery without angina pectoris: Secondary | ICD-10-CM | POA: Diagnosis not present

## 2023-10-02 DIAGNOSIS — I5020 Unspecified systolic (congestive) heart failure: Secondary | ICD-10-CM

## 2023-10-02 DIAGNOSIS — E785 Hyperlipidemia, unspecified: Secondary | ICD-10-CM | POA: Diagnosis not present

## 2023-10-02 DIAGNOSIS — I255 Ischemic cardiomyopathy: Secondary | ICD-10-CM

## 2023-10-02 DIAGNOSIS — Z952 Presence of prosthetic heart valve: Secondary | ICD-10-CM | POA: Diagnosis not present

## 2023-10-02 DIAGNOSIS — I10 Essential (primary) hypertension: Secondary | ICD-10-CM | POA: Diagnosis not present

## 2023-10-02 NOTE — Patient Instructions (Signed)
 Medication Instructions:  Continue all current medications.   Labwork: none  Testing/Procedures: none  Follow-Up: 6 months   Any Other Special Instructions Will Be Listed Below (If Applicable).   If you need a refill on your cardiac medications before your next appointment, please call your pharmacy.

## 2023-10-02 NOTE — Progress Notes (Addendum)
 Cardiology Office Note:   .   Date:  10/02/2023 ID:  Ishmael, Berkovich [birth year], MRN [id number] PCP: Donetta Potts, MD  [facility] Providers Cardiologist:  Dina Rich, MD    History of Present Illness: .   JAUN GALLUZZO is a 78 y.o. male with a PMH of chronic HFrEF, ICM, CAD, s/p CABG x 5 in 1999, hypertension, hyperlipidemia, aortic valve stenosis, s/p TAVR in September 2024, colon lesion, who presents today for follow-up.  Last seen by Dr. Dina Rich on June 03, 2023.  It was noted he had a recurrent drop in EF that was seen on echocardiogram after his recent TAVR.  Medication was limited as he had a history of hyperkalemia on Entresto and Aldactone, therefore he was off both medications at the time.  Lab work was arranged and kidney function was overall unremarkable.  Last seen on 08/03/2023. Was doing well.   Today he presents for follow-up. Continues to do well.  Denies any acute cardiac complaints or issues. Denies any chest pain, shortness of breath, palpitations, syncope, presyncope, dizziness, orthopnea, PND, swelling or significant weight changes, acute bleeding, or claudication. Says he is returning to [employer] to increase his exercise regimen.   ROS: Negative. See HPI.  SH: Enjoys working out on bike/elliptical   Studies Reviewed: Marland Kitchen    EKG: EKG  is not ordered today.   Echo 05/2023:  1. Left ventricular ejection fraction, by estimation, is 25 to 30%. The  left ventricle has severely decreased function. The left ventricle  demonstrates regional wall motion abnormalities (see scoring  diagram/findings for description). There is mild left ventricular hypertrophy. Left ventricular diastolic parameters are indeterminate.   2. Swirling LV apical contrast without discrete thrombus.   3. Right ventricular systolic function is normal. The right ventricular  size is normal. Tricuspid regurgitation signal is inadequate for assessing PA pressure.    4. The mitral valve is grossly normal. Mild mitral valve regurgitation.  No evidence of mitral stenosis.   5. The aortic valve has been repaired/replaced. Aortic valve  regurgitation is trivial but not well localized, likely paravalvular.  There is a 26 mm Edwards Sapien prosthetic (TAVR) valve present in the aortic position. Procedure Date: 04/14/2023. Echo findings are consistent with normal structure and function of the aortic  valve prosthesis. Aortic valve area, by VTI measures 2.18 cm. Aortic  valve mean gradient measures 5.5 mmHg. Aortic valve Vmax measures 1.57 m/s.   6. The inferior vena cava is normal in size with greater than 50%  respiratory variability, suggesting right atrial pressure of 3 mmHg.   Right/left heart cath 03/2023:    Ost LAD to Prox LAD lesion is 100% stenosed.   Mid LAD to Dist LAD lesion is 50% stenosed.   Prox Cx lesion is 100% stenosed.   Prox RCA to Mid RCA lesion is 50% stenosed.   Mid RCA lesion is 100% stenosed.   SVG and is normal in caliber.   SVG and is normal in caliber.   1. Severe triple vessel CAD s/p 5V CABG with 5/5 patent bypass grafts.  2. The LAD is known to be occluded proximally (not engaged today). The LAD and diagonal fill from the patent vein graft. The LIMA was not used for bypass.  3. The Circumflex is occluded in the proximal to mid vessel, known from prior cath and not engaged today. The patent sequential vein graft fills the proximal and distal OM branches 4. The RCA is  occluded in the mid to distal vessel. The distal RCA/PDA and PLA fills from left to right collaterals and from the patent vein graft 5. Elevated right heart pressures   Recommendations: Medical management of CAD. Will continue workup for TAVR. We will arrange CT scans next and then get him in to see Dr. [name]. He will increase his Lasix to 40 mg daily.   CMRI 03/2020:  IMPRESSION: 1. Mild LV dilatation with severe systolic dysfunction (EF 26%). 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 Hypokinesis of mid anterior/anterolateral walls and apical anterior/septal/lateral walls. Akinesis of mid inferolateral wall   2. Subendocardial late gadolinium enhancement consistent with prior infarcts in LAD and LCX territories. LGE is <50% transmural suggesting viability in mid anterior/anterolateral walls and apical septal/anterior/inferior/lateral walls. LGE >50% transmural suggesting nonviability at apex. In addition, there is significant thinning in mid inferolateral wall (wall thickness 4mm), suggesting nonviability.   3.  Normal RV size and systolic function (EF 50%)   4.  Mild mitral regurgitation (regurgitant fraction 19%)  Physical Exam:   VS:  BP 128/78   Pulse 68   Ht 5\' 10"  (1.778 m)   Wt 224 lb (101.6 kg)   SpO2 97%   BMI 32.14 kg/m    Wt Readings from Last 3 Encounters:  10/02/23 224 lb (101.6 kg)  08/03/23 227 lb 12.8 oz (103.3 kg)  06/03/23 218 lb (98.9 kg)    GEN: Obese, 78 y.o. male in no acute distress NECK: No JVD; No carotid bruits CARDIAC: S1/S2, RRR, no murmurs, rubs, gallops RESPIRATORY:  Clear to auscultation without rales, wheezing or rhonchi  ABDOMEN: Soft, non-tender, non-distended EXTREMITIES:  No edema; No deformity   ASSESSMENT AND PLAN: .    HFrEF, ICM Stage C, NYHA class I symptoms. EF 25% 05/2023. Euvolemic and well compensated on exam. GDMT limited d/t past history of hyperkalemia on Entresto and Aldactone, therefore he was off both medications at the time. Continue current GDMT. Low sodium diet, fluid restriction <2L, and daily weights encouraged. Educated to contact our office for weight gain of 2 lbs overnight or 5 lbs in one week.  CAD, s/p CABG x 5 in 1999 Stable with no anginal symptoms. No indication for ischemic evaluation. Continue Aspirin, Imdur, Toprol XL, rosuvastatin and Losartan. Heart healthy diet and regular cardiovascular exercise encouraged.   AS, s/p TAVR Doing well post TAVR in September 2024. See  Echo report from 05/2023 noted above. SBE prophylaxis discussed and he verbalized understanding. Continue to follow-up with structural heart team.   HTN BP stable. Discussed to monitor BP at home at least 2 hours after medications and sitting for 5-10 minutes. Heart healthy diet and regular cardiovascular exercise encouraged. No medication changes at this time.   HLD LDL 80 in June 2023. LDL goal < 55. Continue rosuvastatin. Will request labs from PCP's office. Heart healthy diet and regular cardiovascular exercise encouraged. Possibly add Zetia if LDL is not at goal.      Dispo: Follow-up with Dr. Dina Rich or APP in 6 months or sooner if anything changes.   Signed, Sharlene Dory, NP

## 2023-10-06 ENCOUNTER — Other Ambulatory Visit: Payer: Self-pay | Admitting: Physician Assistant

## 2023-10-07 DIAGNOSIS — Z6832 Body mass index (BMI) 32.0-32.9, adult: Secondary | ICD-10-CM | POA: Diagnosis not present

## 2023-10-07 DIAGNOSIS — E7800 Pure hypercholesterolemia, unspecified: Secondary | ICD-10-CM | POA: Diagnosis not present

## 2023-10-07 DIAGNOSIS — I10 Essential (primary) hypertension: Secondary | ICD-10-CM | POA: Diagnosis not present

## 2023-10-07 DIAGNOSIS — E039 Hypothyroidism, unspecified: Secondary | ICD-10-CM | POA: Diagnosis not present

## 2023-10-07 DIAGNOSIS — I509 Heart failure, unspecified: Secondary | ICD-10-CM | POA: Diagnosis not present

## 2023-10-26 DIAGNOSIS — X32XXXA Exposure to sunlight, initial encounter: Secondary | ICD-10-CM | POA: Diagnosis not present

## 2023-10-26 DIAGNOSIS — L570 Actinic keratosis: Secondary | ICD-10-CM | POA: Diagnosis not present

## 2023-11-01 ENCOUNTER — Other Ambulatory Visit: Payer: Self-pay | Admitting: Cardiology

## 2023-12-15 ENCOUNTER — Ambulatory Visit: Admitting: Physician Assistant

## 2023-12-23 ENCOUNTER — Ambulatory Visit: Admitting: Physician Assistant

## 2024-01-05 DIAGNOSIS — E1165 Type 2 diabetes mellitus with hyperglycemia: Secondary | ICD-10-CM | POA: Diagnosis not present

## 2024-01-05 DIAGNOSIS — D559 Anemia due to enzyme disorder, unspecified: Secondary | ICD-10-CM | POA: Diagnosis not present

## 2024-01-05 DIAGNOSIS — E559 Vitamin D deficiency, unspecified: Secondary | ICD-10-CM | POA: Diagnosis not present

## 2024-01-05 DIAGNOSIS — Z13220 Encounter for screening for lipoid disorders: Secondary | ICD-10-CM | POA: Diagnosis not present

## 2024-01-05 DIAGNOSIS — E039 Hypothyroidism, unspecified: Secondary | ICD-10-CM | POA: Diagnosis not present

## 2024-01-05 DIAGNOSIS — E7801 Familial hypercholesterolemia: Secondary | ICD-10-CM | POA: Diagnosis not present

## 2024-01-05 DIAGNOSIS — I10 Essential (primary) hypertension: Secondary | ICD-10-CM | POA: Diagnosis not present

## 2024-01-21 ENCOUNTER — Other Ambulatory Visit: Payer: Self-pay | Admitting: Nurse Practitioner

## 2024-02-03 DIAGNOSIS — Z0001 Encounter for general adult medical examination with abnormal findings: Secondary | ICD-10-CM | POA: Diagnosis not present

## 2024-02-03 DIAGNOSIS — E7801 Familial hypercholesterolemia: Secondary | ICD-10-CM | POA: Diagnosis not present

## 2024-02-03 DIAGNOSIS — I509 Heart failure, unspecified: Secondary | ICD-10-CM | POA: Diagnosis not present

## 2024-02-03 DIAGNOSIS — Z1331 Encounter for screening for depression: Secondary | ICD-10-CM | POA: Diagnosis not present

## 2024-02-03 DIAGNOSIS — E1169 Type 2 diabetes mellitus with other specified complication: Secondary | ICD-10-CM | POA: Diagnosis not present

## 2024-02-03 DIAGNOSIS — E7800 Pure hypercholesterolemia, unspecified: Secondary | ICD-10-CM | POA: Diagnosis not present

## 2024-02-03 DIAGNOSIS — Z1389 Encounter for screening for other disorder: Secondary | ICD-10-CM | POA: Diagnosis not present

## 2024-02-03 DIAGNOSIS — Z6832 Body mass index (BMI) 32.0-32.9, adult: Secondary | ICD-10-CM | POA: Diagnosis not present

## 2024-02-03 DIAGNOSIS — D72820 Lymphocytosis (symptomatic): Secondary | ICD-10-CM | POA: Diagnosis not present

## 2024-02-14 ENCOUNTER — Other Ambulatory Visit: Payer: Self-pay | Admitting: Cardiology

## 2024-02-25 ENCOUNTER — Other Ambulatory Visit: Payer: Self-pay | Admitting: Cardiology

## 2024-03-18 DIAGNOSIS — Z13220 Encounter for screening for lipoid disorders: Secondary | ICD-10-CM | POA: Diagnosis not present

## 2024-03-18 DIAGNOSIS — E039 Hypothyroidism, unspecified: Secondary | ICD-10-CM | POA: Diagnosis not present

## 2024-03-18 DIAGNOSIS — D559 Anemia due to enzyme disorder, unspecified: Secondary | ICD-10-CM | POA: Diagnosis not present

## 2024-03-18 DIAGNOSIS — I10 Essential (primary) hypertension: Secondary | ICD-10-CM | POA: Diagnosis not present

## 2024-03-18 DIAGNOSIS — E7801 Familial hypercholesterolemia: Secondary | ICD-10-CM | POA: Diagnosis not present

## 2024-04-01 ENCOUNTER — Ambulatory Visit: Payer: PPO | Attending: Nurse Practitioner | Admitting: Nurse Practitioner

## 2024-04-01 ENCOUNTER — Encounter: Payer: Self-pay | Admitting: Nurse Practitioner

## 2024-04-01 ENCOUNTER — Encounter: Payer: Self-pay | Admitting: Internal Medicine

## 2024-04-01 VITALS — BP 124/72 | HR 72 | Ht 70.0 in | Wt 221.0 lb

## 2024-04-01 DIAGNOSIS — I2510 Atherosclerotic heart disease of native coronary artery without angina pectoris: Secondary | ICD-10-CM

## 2024-04-01 DIAGNOSIS — E785 Hyperlipidemia, unspecified: Secondary | ICD-10-CM

## 2024-04-01 DIAGNOSIS — I5020 Unspecified systolic (congestive) heart failure: Secondary | ICD-10-CM | POA: Diagnosis not present

## 2024-04-01 DIAGNOSIS — Z952 Presence of prosthetic heart valve: Secondary | ICD-10-CM | POA: Diagnosis not present

## 2024-04-01 DIAGNOSIS — I255 Ischemic cardiomyopathy: Secondary | ICD-10-CM

## 2024-04-01 DIAGNOSIS — I10 Essential (primary) hypertension: Secondary | ICD-10-CM | POA: Diagnosis not present

## 2024-04-01 NOTE — Patient Instructions (Addendum)

## 2024-04-01 NOTE — Progress Notes (Addendum)
 Cardiology Office Note:   .   Date: 04/01/2024 ID:  Kenneth Randall, DOB 06/23/1946, MRN [id number] PCP: Trudy Vaughn FALCON, MD  [facility] Providers Cardiologist:  Alvan Carrier, MD    History of Present Illness: .   Kenneth Randall is a 78 y.o. male with a PMH of chronic HFrEF, ICM, CAD, s/p CABG x 5 in 1999, hypertension, hyperlipidemia, aortic valve stenosis, s/p TAVR in September 2024, colon lesion, who presents today for follow-up.  Last seen by Dr. Carrier Alvan on June 03, 2023.  It was noted he had a recurrent drop in EF that was seen on echocardiogram after his recent TAVR.  Medication was limited as he had a history of hyperkalemia on Entresto  and Aldactone , therefore he was off both medications at the time.  Lab work was arranged and kidney function was overall unremarkable.  Last seen on 08/03/2023. Was doing well.   10/02/2023 - Today he presents for follow-up. Continues to do well.  Denies any acute cardiac complaints or issues. Denies any chest pain, shortness of breath, palpitations, syncope, presyncope, dizziness, orthopnea, PND, swelling or significant weight changes, acute bleeding, or claudication. Says he is returning to [employer] to increase his exercise regimen.   03/31/2024 - Here for follow-up.  Doing well and says he had labs done with his PCP recently-do not have these on file currently.  We will request these. Denies any chest pain, shortness of breath, palpitations, syncope, presyncope, dizziness, orthopnea, PND, swelling or significant weight changes, acute bleeding, or claudication.  ROS: Negative. See HPI.  SH: Enjoys working out on bike/elliptical   Studies Reviewed: SABRA    EKG:  EKG Interpretation Date/Time:  Friday April 01 2024 09:18:18 EDT Ventricular Rate:  72 PR Interval:  194 QRS Duration:  90 QT Interval:  394 QTC Calculation: 431 R Axis:   72  Text Interpretation: Normal sinus rhythm Normal ECG When compared with ECG of  22-Apr-2023 13:18, Premature ventricular complexes are no longer Present Nonspecific T wave abnormality now evident in Inferior leads Confirmed by Miriam Norris [phone]) on 04/01/2024 9:41:16 AM    Echo 05/2023:  1. Left ventricular ejection fraction, by estimation, is 25 to 30%. The  left ventricle has severely decreased function. The left ventricle  demonstrates regional wall motion abnormalities (see scoring  diagram/findings for description). There is mild left ventricular hypertrophy. Left ventricular diastolic parameters are indeterminate.   2. Swirling LV apical contrast without discrete thrombus.   3. Right ventricular systolic function is normal. The right ventricular  size is normal. Tricuspid regurgitation signal is inadequate for assessing PA pressure.   4. The mitral valve is grossly normal. Mild mitral valve regurgitation.  No evidence of mitral stenosis.   5. The aortic valve has been repaired/replaced. Aortic valve  regurgitation is trivial but not well localized, likely paravalvular.  There is a 26 mm Edwards Sapien prosthetic (TAVR) valve present in the aortic position. Procedure Date: 04/14/2023. Echo findings are consistent with normal structure and function of the aortic  valve prosthesis. Aortic valve area, by VTI measures 2.18 cm. Aortic  valve mean gradient measures 5.5 mmHg. Aortic valve Vmax measures 1.57 m/s.   6. The inferior vena cava is normal in size with greater than 50%  respiratory variability, suggesting right atrial pressure of 3 mmHg.   Right/left heart cath 03/2023:    Ost LAD to Prox LAD lesion is 100% stenosed.   Mid LAD to Dist LAD lesion is 50% stenosed.  Prox Cx lesion is 100% stenosed.   Prox RCA to Mid RCA lesion is 50% stenosed.   Mid RCA lesion is 100% stenosed.   SVG and is normal in caliber.   SVG and is normal in caliber.   1. Severe triple vessel CAD s/p 5V CABG with 5/5 patent bypass grafts.  2. The LAD is known to be occluded  proximally (not engaged today). The LAD and diagonal fill from the patent vein graft. The LIMA was not used for bypass.  3. The Circumflex is occluded in the proximal to mid vessel, known from prior cath and not engaged today. The patent sequential vein graft fills the proximal and distal OM branches 4. The RCA is occluded in the mid to distal vessel. The distal RCA/PDA and PLA fills from left to right collaterals and from the patent vein graft 5. Elevated right heart pressures   Recommendations: Medical management of CAD. Will continue workup for TAVR. We will arrange CT scans next and then get him in to see Dr. Lucas. He will increase his Lasix  to 40 mg daily.   CMRI 03/2020:  IMPRESSION: 1. Mild LV dilatation with severe systolic dysfunction (EF 26%). 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 Hypokinesis of mid anterior/anterolateral walls and apical anterior/septal/lateral walls. Akinesis of mid inferolateral wall   2. Subendocardial late gadolinium enhancement consistent with prior infarcts in LAD and LCX territories. LGE is <50% transmural suggesting viability in mid anterior/anterolateral walls and apical septal/anterior/inferior/lateral walls. LGE >50% transmural suggesting nonviability at apex. In addition, there is significant thinning in mid inferolateral wall (wall thickness 4mm), suggesting nonviability.   3.  Normal RV size and systolic function (EF 50%)   4.  Mild mitral regurgitation (regurgitant fraction 19%)  Physical Exam:   VS:  BP 124/72   Pulse 72   Ht 5' 10 (1.778 m)   Wt 221 lb (100.2 kg)   SpO2 98%   BMI 31.71 kg/m    Wt Readings from Last 3 Encounters:  04/01/24 221 lb (100.2 kg)  10/02/23 224 lb (101.6 kg)  08/03/23 227 lb 12.8 oz (103.3 kg)    GEN: Obese, 78 y.o. male in no acute distress NECK: No JVD; No carotid bruits CARDIAC: S1/S2, RRR, no murmurs, rubs, gallops RESPIRATORY:  Clear to auscultation without rales, wheezing or rhonchi  ABDOMEN: Soft,  non-tender, non-distended EXTREMITIES:  No edema; No deformity   ASSESSMENT AND PLAN: .    HFrEF, ICM Stage C, NYHA class I symptoms. EF 25% 05/2023. Euvolemic and well compensated on exam. GDMT limited d/t past history of hyperkalemia on Entresto  and Aldactone , therefore he was off both medications at the time. Continue current GDMT. Low sodium diet, fluid restriction <2L, and daily weights encouraged. Educated to contact our office for weight gain of 2 lbs overnight or 5 lbs in one week.  CAD, s/p CABG x 5 in 1999 Stable with no anginal symptoms. No indication for ischemic evaluation. Continue Aspirin , Imdur , Toprol  XL, rosuvastatin  and Losartan . Heart healthy diet and regular cardiovascular exercise encouraged.   AS, s/p TAVR Doing well post TAVR in September 2024. See Echo report from 05/2023 noted above. SBE prophylaxis discussed and he verbalized understanding. Continue to follow-up with structural heart team.  He has upcoming echocardiogram scheduled for September 2025.   HTN BP stable. Discussed to monitor BP at home at least 2 hours after medications and sitting for 5-10 minutes. Heart healthy diet and regular cardiovascular exercise encouraged. No medication changes at this time.  HLD Being managed by PCP. Continue rosuvastatin . Tells me he was recently started on Zetia. Will request his recent labs from PCP's office. Heart healthy diet and regular cardiovascular exercise encouraged.   I spent a total duration of 20 minutes reviewing prior notes, reviewing outside records including  labs, EKG today, face-to-face counseling of medical condition, pathophysiology, evaluation, management, and documenting the findings in the note.      Dispo: Follow-up with Dr. Dorn Ross or APP in 6 months or sooner if anything changes.   Signed, Almarie Crate, NP

## 2024-04-08 NOTE — Progress Notes (Addendum)
 [facility]   MULTIDISCIPLINARY HEART VALVE CLINIC                                     Cardiology Office Note:    Date:  04/11/2024   ID:  Kenneth Randall, DOB [birth year], MRN [id number]  PCP:  Trudy Vaughn FALCON, MD  [facility] Cardiologist:  Alvan Carrier, MD  [facility] Structural heart: Lonni Cash, MD [facility] Electrophysiologist:  None   Referring MD: Cash Lonni BIRCH*   1 year s/p TAVR  History of Present Illness:    Kenneth Randall is a 78 y.o. 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    He was admitted to [facility] in 2021 with acute CHF and EF down to 20-25%. He was started on GDMT. His EF increased to 40 to 45% but he developed hyperkalemia and Entresto /spiro had to be discontinued. cMRI 03/2020 showed mild LV dilatation with severe systolic dysfunction (EF 26%) apical aneurysm, subendocardial LGE consistent with prior infarcts in LAD and LCX territories and LGE >50% transmural suggesting nonviability at apex. Repeat echo 02/2023 showed a drop in his ejection fraction to 30 to 35% with G2DD as well as severe LFLG AS and mod-severe AI. He was referred to Dr. Cash for [facility] 03/30/23 which showed severe triple vessel CAD s/p 5V CABG with 5/5 patent bypass grafts as well as elevated right heart pressures. Lasix  was increased from 20mg  to 40mg  daily. S/p TAVR with a 26 mm Edwards Sapien 3 Ultra Resilia THV via the TF approach on 04/14/23. Post operative echo showed EF 30-35%, normally functioning TAVR with a mean gradient of 8 mmHg and no PVL. Resumed on home baby Asprin 81mg  daily. 1 month echo showed EF 25%, normally functioning TAVR with a mean gradient of 5.5 mm hg and trivial PVL.    Today the patient presents to clinic for follow up. Here alone. No CP or SOB. No LE edema,  orthopnea or PND. No dizziness or syncope. No blood in stool or urine. No palpitations.    Past Medical History:  Diagnosis Date   Aortic stenosis    CAD (coronary artery disease)    Cancer (HCC)    skin   Hypertension    Ischemic cardiomyopathy    Pre-diabetes    PSVT (paroxysmal supraventricular tachycardia) (HCC)    S/P TAVR (transcatheter aortic valve replacement) 04/14/2023   s/p TAVR with a 26 mm Edwards S3UR via the TF approach by Dr. Cash & Dr. Lucas     Current Medications: Current Meds  Medication Sig   acetaminophen  (TYLENOL ) 650 MG CR tablet Take 1,300 mg by mouth every 8 (eight) hours as needed for pain.   amoxicillin  (AMOXIL ) 500 MG tablet Take 4 tablets (2,000 mg total) by mouth as directed. 1 hour prior to dental work including cleanings   aspirin  EC 81 MG tablet Take 1 tablet (81 mg total) by mouth daily. Swallow whole.   Coenzyme Q10 (COQ10) 100 MG CAPS Take 100 mg by mouth daily.   ezetimibe (ZETIA) 10 MG tablet Take 10 mg by mouth daily.   furosemide  (LASIX ) 40 MG tablet TAKE 1 TABLET BY MOUTH EVERY DAY   glipiZIDE (GLUCOTROL XL) 5 MG 24 hr tablet  Take 5 mg by mouth daily.   hydrALAZINE  (APRESOLINE ) 25 MG tablet TAKE 1 TABLET BY MOUTH TWICE A DAY   isosorbide  mononitrate (IMDUR ) 30 MG 24 hr tablet TAKE 1/2 TABLET BY MOUTH DAILY   JARDIANCE  10 MG TABS tablet Take 10 mg by mouth daily.   levothyroxine  (SYNTHROID ) 50 MCG tablet Take 50 mcg by mouth daily.   losartan  (COZAAR ) 25 MG tablet Take 1 tablet (25 mg total) by mouth daily.   metFORMIN (GLUCOPHAGE-XR) 500 MG 24 hr tablet Take 500 mg by mouth 2 (two) times daily.   metoprolol  (TOPROL -XL) 200 MG 24 hr tablet TAKE 1 TABLET BY MOUTH EVERY DAY   rosuvastatin  (CRESTOR ) 20 MG tablet TAKE 1 TABLET BY MOUTH EVERY DAY      ROS:   Please see the history of present illness.    All other systems reviewed and are negative.  EKGs       Risk Assessment/Calculations:           Physical Exam:    VS:   BP (!) 146/84   Pulse 67   Ht 5' 10 (1.778 m)   Wt 220 lb 12.8 oz (100.2 kg)   SpO2 98%   BMI 31.68 kg/m     Wt Readings from Last 3 Encounters:  04/11/24 220 lb 12.8 oz (100.2 kg)  04/01/24 221 lb (100.2 kg)  10/02/23 224 lb (101.6 kg)     GEN: Well nourished, well developed in no acute distress NECK: No JVD CARDIAC: RRR, no murmurs, rubs, gallops RESPIRATORY:  Clear to auscultation without rales, wheezing or rhonchi  ABDOMEN: Soft, non-tender, non-distended EXTREMITIES:  No edema; No deformity.    ASSESSMENT:    1. S/P TAVR (transcatheter aortic valve replacement)   2. HFrEF (heart failure with reduced ejection fraction) (HCC)   3. Coronary artery disease involving native heart without angina pectoris, unspecified vessel or lesion type   4. Essential (primary) hypertension   5. Lesion of colon     PLAN:    In order of problems listed above:  Severe AS/AI s/p TAVR:  -- Echo echo today shows EF 35-40% with mild improvement from previous, known akinetic apex with no LV thrombus, normally functioning TAVR with a mean gradient of 4.6 mmHg and no PVL. -- He has NYHA class I symptoms.  -- Continue baby Asprin 81mg  daily.  -- SBE prophylaxis discussed; he has amoxicillin .  -- Continue regular follow up with Dr. Alvan.   Chronic HFrEF:  -- EF 35-40% (mildly improved from previous).  -- Ischemic CM by cMRI 2021.  -- Appears euvolemic today.  -- Continue Lasix  40mg  daily, Toprol  XL 200mg  daily, Jardiance  10mg  daily, hydralazine /nitrate combo, losartan  25mg  daily. -- Cannot take MRA/ARNI due to hyperkalemia.  -- Cautiously started on losartan  and EF with mild improvement. K 5.1 and creat ~1.4 on labs from PCP in August (personally reviewed).    CAD s/p CABG:  -- Cath 03/29/24 showed severe triple vessel CAD s/p 5V CABG with 5/5 patent bypass grafts.  -- Continue medical therapy with aspirin , statin and BB.   HTN:  -- BP well controlled.  -- Continue Lasix  40mg  daily,  Toprol  XL 200mg  daily, hydralazine /nitrate combo, losartan  25mg  daily.   Lesion of colon:  -- noted pre TAVR CT. Follow up abdominal CT showed a calcified mass in colon stable and felt to be almost certainly benign. -- No further work up needed.     Medication Adjustments/Labs and Tests Ordered: Current medicines are reviewed  at length with the patient today.  Concerns regarding medicines are outlined above.  No orders of the defined types were placed in this encounter.  No orders of the defined types were placed in this encounter.   Patient Instructions  Medication Instructions:  Your physician recommends that you continue on your current medications as directed. Please refer to the Current Medication list given to you today.  *If you need a refill on your cardiac medications before your next appointment, please call your pharmacy*  Lab Work: None needed If you have labs (blood work) drawn today and your tests are completely normal, you will receive your results only by: MyChart Message (if you have MyChart) OR A paper copy in the mail If you have any lab test that is abnormal or we need to change your treatment, we will call you to review the results.  Testing/Procedures: None needed  Follow-Up: At [facility], you and your health needs are our priority.  As part of our continuing mission to provide you with exceptional heart care, our providers are all part of one team.  This team includes your primary Cardiologist (physician) and Advanced Practice Providers or APPs (Physician Assistants and Nurse Practitioners) who all work together to provide you with the care you need, when you need it.  Your next appointment:   5 month(s)  Provider:   Alvan Carrier, MD    We recommend signing up for the patient portal called MyChart.  Sign up information is provided on this After Visit Summary.  MyChart is used to connect with patients for Virtual Visits (Telemedicine).   Patients are able to view lab/test results, encounter notes, upcoming appointments, etc.  Non-urgent messages can be sent to your provider as well.   To learn more about what you can do with MyChart, go to [email or web address].        Signed, Lamarr Hummer, PA-C  04/11/2024 2:26 PM    [facility]

## 2024-04-11 ENCOUNTER — Ambulatory Visit (HOSPITAL_COMMUNITY)
Admission: RE | Admit: 2024-04-11 | Discharge: 2024-04-11 | Disposition: A | Discharge status: Home / Self Care | Source: Ambulatory Visit | Attending: Internal Medicine | Admitting: Internal Medicine

## 2024-04-11 ENCOUNTER — Ambulatory Visit: Payer: Self-pay | Admitting: Physician Assistant

## 2024-04-11 ENCOUNTER — Ambulatory Visit: Attending: Physician Assistant | Admitting: Physician Assistant

## 2024-04-11 VITALS — BP 146/84 | HR 67 | Ht 70.0 in | Wt 220.8 lb

## 2024-04-11 DIAGNOSIS — I5020 Unspecified systolic (congestive) heart failure: Secondary | ICD-10-CM | POA: Diagnosis not present

## 2024-04-11 DIAGNOSIS — Z952 Presence of prosthetic heart valve: Secondary | ICD-10-CM

## 2024-04-11 DIAGNOSIS — I10 Essential (primary) hypertension: Secondary | ICD-10-CM

## 2024-04-11 DIAGNOSIS — I2510 Atherosclerotic heart disease of native coronary artery without angina pectoris: Secondary | ICD-10-CM

## 2024-04-11 DIAGNOSIS — K639 Disease of intestine, unspecified: Secondary | ICD-10-CM | POA: Diagnosis not present

## 2024-04-11 LAB — ECHOCARDIOGRAM COMPLETE
AV Mean grad: 4.6 mmHg
AV Peak grad: 9.2 mmHg
Ao pk vel: 1.52 m/s
Area-P 1/2: 3.03 cm2
MV M vel: 3.55 m/s
MV Peak grad: 50.4 mmHg
S' Lateral: 4.6 cm

## 2024-04-11 MED ORDER — PERFLUTREN LIPID MICROSPHERE
1.0000 mL | INTRAVENOUS | Status: AC | PRN
  Administered 2024-04-11: 2 mL via INTRAVENOUS

## 2024-04-11 NOTE — Patient Instructions (Signed)
 Medication Instructions:  Your physician recommends that you continue on your current medications as directed. Please refer to the Current Medication list given to you today.  *If you need a refill on your cardiac medications before your next appointment, please call your pharmacy*  Lab Work: None needed If you have labs (blood work) drawn today and your tests are completely normal, you will receive your results only by: MyChart Message (if you have MyChart) OR A paper copy in the mail If you have any lab test that is abnormal or we need to change your treatment, we will call you to review the results.  Testing/Procedures: None needed  Follow-Up: At [facility], you and your health needs are our priority.  As part of our continuing mission to provide you with exceptional heart care, our providers are all part of one team.  This team includes your primary Cardiologist (physician) and Advanced Practice Providers or APPs (Physician Assistants and Nurse Practitioners) who all work together to provide you with the care you need, when you need it.  Your next appointment:   5 month(s)  Provider:   Alvan Carrier, MD    We recommend signing up for the patient portal called MyChart.  Sign up information is provided on this After Visit Summary.  MyChart is used to connect with patients for Virtual Visits (Telemedicine).  Patients are able to view lab/test results, encounter notes, upcoming appointments, etc.  Non-urgent messages can be sent to your provider as well.   To learn more about what you can do with MyChart, go to [email or web address].

## 2024-04-13 ENCOUNTER — Ambulatory Visit: Payer: PPO

## 2024-04-13 ENCOUNTER — Other Ambulatory Visit (HOSPITAL_COMMUNITY): Payer: PPO

## 2024-04-16 ENCOUNTER — Other Ambulatory Visit: Payer: Self-pay | Admitting: Cardiology

## 2024-04-18 ENCOUNTER — Other Ambulatory Visit: Payer: Self-pay | Admitting: Cardiology

## 2024-04-25 DIAGNOSIS — X32XXXD Exposure to sunlight, subsequent encounter: Secondary | ICD-10-CM | POA: Diagnosis not present

## 2024-04-25 DIAGNOSIS — L570 Actinic keratosis: Secondary | ICD-10-CM | POA: Diagnosis not present

## 2024-05-30 DIAGNOSIS — D559 Anemia due to enzyme disorder, unspecified: Secondary | ICD-10-CM | POA: Diagnosis not present

## 2024-05-30 DIAGNOSIS — I10 Essential (primary) hypertension: Secondary | ICD-10-CM | POA: Diagnosis not present

## 2024-05-30 DIAGNOSIS — E875 Hyperkalemia: Secondary | ICD-10-CM | POA: Diagnosis not present

## 2024-05-30 DIAGNOSIS — E1165 Type 2 diabetes mellitus with hyperglycemia: Secondary | ICD-10-CM | POA: Diagnosis not present

## 2024-05-30 DIAGNOSIS — Z0001 Encounter for general adult medical examination with abnormal findings: Secondary | ICD-10-CM | POA: Diagnosis not present

## 2024-06-06 ENCOUNTER — Encounter: Payer: Self-pay | Admitting: Internal Medicine

## 2024-06-06 ENCOUNTER — Telehealth: Payer: Self-pay | Admitting: Cardiology

## 2024-06-06 DIAGNOSIS — Z6833 Body mass index (BMI) 33.0-33.9, adult: Secondary | ICD-10-CM | POA: Diagnosis not present

## 2024-06-06 DIAGNOSIS — L989 Disorder of the skin and subcutaneous tissue, unspecified: Secondary | ICD-10-CM | POA: Diagnosis not present

## 2024-06-06 DIAGNOSIS — Z952 Presence of prosthetic heart valve: Secondary | ICD-10-CM | POA: Diagnosis not present

## 2024-06-06 DIAGNOSIS — I2510 Atherosclerotic heart disease of native coronary artery without angina pectoris: Secondary | ICD-10-CM | POA: Diagnosis not present

## 2024-06-06 DIAGNOSIS — E875 Hyperkalemia: Secondary | ICD-10-CM | POA: Diagnosis not present

## 2024-06-06 DIAGNOSIS — E039 Hypothyroidism, unspecified: Secondary | ICD-10-CM | POA: Diagnosis not present

## 2024-06-06 DIAGNOSIS — E1169 Type 2 diabetes mellitus with other specified complication: Secondary | ICD-10-CM | POA: Diagnosis not present

## 2024-06-06 DIAGNOSIS — I10 Essential (primary) hypertension: Secondary | ICD-10-CM | POA: Diagnosis not present

## 2024-06-06 DIAGNOSIS — I509 Heart failure, unspecified: Secondary | ICD-10-CM | POA: Diagnosis not present

## 2024-06-06 NOTE — Telephone Encounter (Signed)
 Pt c/o medication issue:  1. Name of Medication:   losartan  (COZAAR ) 25 MG tablet    2. How are you currently taking this medication (dosage and times per day)?    3. Are you having a reaction (difficulty breathing--STAT)? no  4. What is your medication issue? Facility has instructed the patient to stop taking the medication because of his elevated potassium levels. Calling to make the dr aware. They will be faxing over patient's lab work. Please advise.

## 2024-06-10 NOTE — Telephone Encounter (Signed)
 Patient notified and verbalized understanding.

## 2024-07-17 ENCOUNTER — Other Ambulatory Visit: Payer: Self-pay | Admitting: Cardiology

## 2024-08-10 NOTE — Progress Notes (Signed)
 RICK CARRUTHERS                                          MRN: [id number]   08/10/2024   The [facility] Quality Team Specialist reviewed this patient medical record for the purposes of chart review for care gap closure. The following were reviewed: chart review for care gap closure-kidney health evaluation for diabetes:eGFR  and uACR.    [facility] Quality Team

## 2024-09-06 ENCOUNTER — Other Ambulatory Visit (HOSPITAL_COMMUNITY): Payer: Self-pay | Admitting: Nephrology

## 2024-09-06 DIAGNOSIS — E785 Hyperlipidemia, unspecified: Secondary | ICD-10-CM

## 2024-09-06 DIAGNOSIS — I10 Essential (primary) hypertension: Secondary | ICD-10-CM

## 2024-09-06 DIAGNOSIS — N182 Chronic kidney disease, stage 2 (mild): Secondary | ICD-10-CM

## 2024-09-07 ENCOUNTER — Other Ambulatory Visit: Payer: Self-pay | Admitting: Cardiology

## 2024-09-09 NOTE — Telephone Encounter (Signed)
 In accordance with refill protocols, please review and address the following requirements before this medication refill can be authorized:  Labs   Pt had labs done scanned into Epic on 05/30/2024, but pt needs Mg labs done within 365 days within normal range

## 2024-09-29 ENCOUNTER — Ambulatory Visit: Admitting: Cardiology
